# Patient Record
Sex: Female | Born: 1980
Health system: Southern US, Community
[De-identification: ages and names within clinical notes are randomized; demographics above are authoritative.]

## PROBLEM LIST (undated history)

## (undated) DIAGNOSIS — O24419 Gestational diabetes mellitus in pregnancy, unspecified control: Secondary | ICD-10-CM

## (undated) DIAGNOSIS — A630 Anogenital (venereal) warts: Secondary | ICD-10-CM

## (undated) DIAGNOSIS — A6000 Herpesviral infection of urogenital system, unspecified: Secondary | ICD-10-CM

## (undated) DIAGNOSIS — D563 Thalassemia minor: Secondary | ICD-10-CM

## (undated) DIAGNOSIS — Z8619 Personal history of other infectious and parasitic diseases: Secondary | ICD-10-CM

## (undated) DIAGNOSIS — D509 Iron deficiency anemia, unspecified: Secondary | ICD-10-CM

## (undated) DIAGNOSIS — T7840XA Allergy, unspecified, initial encounter: Secondary | ICD-10-CM

## (undated) DIAGNOSIS — O09299 Supervision of pregnancy with other poor reproductive or obstetric history, unspecified trimester: Secondary | ICD-10-CM

## (undated) DIAGNOSIS — N921 Excessive and frequent menstruation with irregular cycle: Secondary | ICD-10-CM

## (undated) DIAGNOSIS — I10 Essential (primary) hypertension: Secondary | ICD-10-CM

## (undated) DIAGNOSIS — R87629 Unspecified abnormal cytological findings in specimens from vagina: Secondary | ICD-10-CM

## (undated) HISTORY — DX: Gestational diabetes mellitus in pregnancy, unspecified control: O24.419

## (undated) HISTORY — DX: Anogenital (venereal) warts: A63.0

## (undated) HISTORY — DX: Thalassemia minor: D56.3

## (undated) HISTORY — DX: Personal history of other infectious and parasitic diseases: Z86.19

## (undated) HISTORY — DX: Herpesviral infection of urogenital system, unspecified: A60.00

## (undated) HISTORY — PX: COLPOSCOPY: SHX161

## (undated) HISTORY — DX: Allergy, unspecified, initial encounter: T78.40XA

## (undated) HISTORY — DX: Iron deficiency anemia, unspecified: D50.9

## (undated) HISTORY — DX: Supervision of pregnancy with other poor reproductive or obstetric history, unspecified trimester: O09.299

## (undated) HISTORY — DX: Essential (primary) hypertension: I10

## (undated) HISTORY — DX: Excessive and frequent menstruation with irregular cycle: N92.1

---
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
## 2007-03-27 HISTORY — PX: DILATION AND EVACUATION: SHX1459

## 2007-07-07 ENCOUNTER — Inpatient Hospital Stay (HOSPITAL_COMMUNITY): Admission: AD | Admit: 2007-07-07 | Discharge: 2007-07-11 | Payer: Self-pay | Admitting: Obstetrics & Gynecology

## 2007-07-09 ENCOUNTER — Encounter (INDEPENDENT_AMBULATORY_CARE_PROVIDER_SITE_OTHER): Payer: Self-pay | Admitting: Obstetrics & Gynecology

## 2007-07-14 ENCOUNTER — Inpatient Hospital Stay (HOSPITAL_COMMUNITY): Admission: AD | Admit: 2007-07-14 | Discharge: 2007-07-15 | Payer: Self-pay | Admitting: Obstetrics and Gynecology

## 2007-07-17 ENCOUNTER — Ambulatory Visit (HOSPITAL_COMMUNITY): Admission: AD | Admit: 2007-07-17 | Discharge: 2007-07-17 | Payer: Self-pay | Admitting: Obstetrics and Gynecology

## 2007-07-17 ENCOUNTER — Encounter (INDEPENDENT_AMBULATORY_CARE_PROVIDER_SITE_OTHER): Payer: Self-pay | Admitting: Obstetrics and Gynecology

## 2008-06-10 ENCOUNTER — Emergency Department (HOSPITAL_COMMUNITY): Admission: EM | Admit: 2008-06-10 | Discharge: 2008-06-10 | Payer: Self-pay | Admitting: Family Medicine

## 2010-05-23 ENCOUNTER — Other Ambulatory Visit: Payer: Self-pay | Admitting: Obstetrics & Gynecology

## 2010-07-06 LAB — HERPES SIMPLEX VIRUS CULTURE

## 2010-07-06 LAB — POCT URINALYSIS DIP (DEVICE)
Protein, ur: NEGATIVE mg/dL
Specific Gravity, Urine: 1.02 (ref 1.005–1.030)
Urobilinogen, UA: 0.2 mg/dL (ref 0.0–1.0)
pH: 6 (ref 5.0–8.0)

## 2010-07-06 LAB — WET PREP, GENITAL: Trich, Wet Prep: NONE SEEN

## 2010-07-06 LAB — URINE CULTURE

## 2010-07-06 LAB — POCT PREGNANCY, URINE: Preg Test, Ur: NEGATIVE

## 2010-07-14 ENCOUNTER — Other Ambulatory Visit: Payer: Self-pay | Admitting: Oncology

## 2010-07-14 ENCOUNTER — Encounter (HOSPITAL_BASED_OUTPATIENT_CLINIC_OR_DEPARTMENT_OTHER): Payer: 59 | Admitting: Oncology

## 2010-07-14 DIAGNOSIS — D500 Iron deficiency anemia secondary to blood loss (chronic): Secondary | ICD-10-CM

## 2010-07-14 LAB — MORPHOLOGY: PLT EST: INCREASED

## 2010-07-14 LAB — CBC WITH DIFFERENTIAL/PLATELET
BASO%: 0.9 % (ref 0.0–2.0)
Basophils Absolute: 0.1 10*3/uL (ref 0.0–0.1)
Eosinophils Absolute: 0.1 10*3/uL (ref 0.0–0.5)
HCT: 20.4 % — ABNORMAL LOW (ref 34.8–46.6)
HGB: 5.7 g/dL — CL (ref 11.6–15.9)
LYMPH%: 30.7 % (ref 14.0–49.7)
MONO#: 0.6 10*3/uL (ref 0.1–0.9)
NEUT#: 3.9 10*3/uL (ref 1.5–6.5)
NEUT%: 58.5 % (ref 38.4–76.8)
Platelets: 689 10*3/uL — ABNORMAL HIGH (ref 145–400)
WBC: 6.7 10*3/uL (ref 3.9–10.3)
lymph#: 2 10*3/uL (ref 0.9–3.3)

## 2010-07-14 LAB — FERRITIN: Ferritin: 17 ng/mL (ref 10–291)

## 2010-07-17 ENCOUNTER — Encounter (HOSPITAL_BASED_OUTPATIENT_CLINIC_OR_DEPARTMENT_OTHER): Payer: 59 | Admitting: Oncology

## 2010-07-17 DIAGNOSIS — D649 Anemia, unspecified: Secondary | ICD-10-CM

## 2010-07-17 DIAGNOSIS — D473 Essential (hemorrhagic) thrombocythemia: Secondary | ICD-10-CM

## 2010-07-17 DIAGNOSIS — N920 Excessive and frequent menstruation with regular cycle: Secondary | ICD-10-CM

## 2010-07-18 ENCOUNTER — Encounter (HOSPITAL_BASED_OUTPATIENT_CLINIC_OR_DEPARTMENT_OTHER): Payer: 59 | Admitting: Oncology

## 2010-07-18 DIAGNOSIS — N920 Excessive and frequent menstruation with regular cycle: Secondary | ICD-10-CM

## 2010-07-18 DIAGNOSIS — D649 Anemia, unspecified: Secondary | ICD-10-CM

## 2010-08-08 NOTE — Op Note (Signed)
NAME:  Emily Downs, Emily Downs NO.:  [id number]   MEDICAL RECORD NO.:  [id number]          PATIENT TYPE:  MAT   LOCATION:  [facility]                          FACILITY:  [facility]   PHYSICIAN:  Randye Lobo, M.D.   DATE OF BIRTH:  1981/03/10   DATE OF PROCEDURE:  07/17/2007  DATE OF DISCHARGE:  07/17/2007                               OPERATIVE REPORT   PREOPERATIVE DIAGNOSES:  1. Retained products of conception.  2. Status post spontaneous vaginal delivery postpartum day #8.   POSTOPERATIVE DIAGNOSES:  1. Retained products of conception.  2. Status post spontaneous vaginal delivery, postpartum day #8.   PROCEDURE:  Dilation and evacuation.   SURGEON:  Conley Simmonds, MD   ANESTHESIA:  Saddle block by Dr. Harvest Forest.   ESTIMATED BLOOD LOSS:  100 mL.   URINE OUTPUT:  25 mL by I and O catheterization prior to procedure.   COMPLICATIONS:  None.   INDICATIONS FOR PROCEDURE:  The patient is a 30 year old gravida 1, para  1 African-American female, postpartum day #8, who presented with a  report of vaginal bleeding and clotting of 8 hours duration combined  with mild cramping.  The patient's prenatal course was complicated by  pregnancy-induced hypertension and treatment of this with Labetalol and  magnesium sulfate IV.  The patient denied any fever, shakes and chills.  Upon presentation, the patient's blood pressure was noted to be 137/90.  Her abdominal exam was consistent with a uterus that was approximately  [redacted] weeks gestational equivalent size.  Pelvic exam demonstrated large  amounts of clots in the vagina.  However, there was no active bleeding  at the time of the exam.  A CBC demonstrated a white blood cell count of  11.9 and a hemoglobin of 12.2.  An ultrasound documented a 4 x 6 cm  collection in the endometrium consistent with retained products of  conception.  There was increased blood flow to this area.   A plan is now made to proceed with a dilation and evacuation  procedure  for suspected retained products of conception after risks, benefits, and  alternatives are reviewed.   FINDINGS:  Exam under anesthesia revealed an 18 weeks size uterus.  The  cervix was noted to be dilated.  A moderate amount of clot and some tissue was obtained from the uterine  cavity.   SPECIMEN:  Products of conception were sent to pathology.   PROCEDURE:  The patient was escorted down to the operating room.  She  did receive Ancef 1 gram IV for antibiotic prophylaxis.  The patient  received a spinal block and was then placed in the dorsal lithotomy  position.  The abdomen and vagina were sterilely prepped and draped and  the bladder was catheterized of urine.   An exam under anesthesia was performed.  A speculum was placed inside  the vagina and a single-tooth tenaculum was placed on the anterior  cervical lip.  The cervix was noted to be dilated.  The uterus was  sounded to approximately 12 cm.  A #12 suction tip curette was then  introduced through the  cervical os to the level of the uterine fundus  and withdrawn slightly.  Proper suction was applied and the suction tip  curette was turned in a clockwise fashion while it was removed from the  uterine cavity.  This was repeated an additional times until there was  no remaining products of conception noted.  A gentle sharp curettage was  performed in all four quadrants and again there was a very minimal  amount of tissue obtained.  There was only a small amount of clot noted.   The suction tip curette was introduced an additional two times and  proper suction was applied during each of these applications to remove  any remaining blood and clots from within the uterine cavity.  None were  further appreciated.   This concluded the patient's procedure.  All of the instruments were  removed.  The patient did receive Pitocin 20 units IV.  She had  excellent hemostasis.  The patient was cleansed with Betadine, taken  out  of the dorsal lithotomy position, and escorted to the recovery room in  stable and awake condition.  There were no complications to the  procedure.  All instrument, needle and sponge counts were correct.      Randye Lobo, M.D.  Electronically Signed     BES/MEDQ  D:  07/18/2007  T:  07/18/2007  Job:  [id number]

## 2010-08-11 NOTE — Discharge Summary (Signed)
NAME:  SHANEE, BATCH NO.:  [id number]   MEDICAL RECORD NO.:  [id number]          PATIENT TYPE:  INP   LOCATION:  9373                          FACILITY:  [facility]   PHYSICIAN:  Ilda Mori, M.D.   DATE OF BIRTH:  [birth year]   DATE OF ADMISSION:  07/14/2007  DATE OF DISCHARGE:  07/15/2007                               DISCHARGE SUMMARY   FINAL DIAGNOSIS:  Postpartum hypertension with questionable  preeclampsia.   COMPLICATIONS:  None.   This 30 year old G1, P1 presents after she had been discharged from a  spontaneous vaginal delivery on April 17.  She was seen by home health  care nurse today and noted elevated blood pressures about 170/110.  She  did not have any headache, visual changes, or epigastric pain.  The  patient was noted to have some elevated blood pressures postpartum, but  was sent home with labetalol.  PIH labs were normal prior to discharge.  Upon admission, the patient was given 10 mg of Procardia and blood  pressures did decrease.  The labetalol was stopped.  She was continued  to be monitored in the hospital.  She continued to feel fine.  The  patient did have some elevation of her liver function tests and was put  on magnesium for prophylaxis.  Normal platelets.  No protein in her  urine and by hospital day #2, the patient was felt ready for discharge  later in the day.  Her liver function tests were returning to normal.  Her blood pressure was stabilizing.  She was sent home on a regular  diet, told to decrease activities, told to continue her Procardia XL 60  mg daily, to follow up in our office in 1 week, and of course to call  with any symptoms or elevated blood pressures.   DISCHARGE LABORATORY DATA:  The patient had a hemoglobin of 12.8,  platelets of 338,000.  Like I said, she did have some elevated liver  function tests which were returning to normal on the afternoon of  discharge.  She had an AST of 43, ALT of 74 on  discharge.      Leilani Able, P.A.-C.      Ilda Mori, M.D.  Electronically Signed    MB/MEDQ  D:  08/04/2007  T:  08/05/2007  Job:  [id number]

## 2010-08-16 ENCOUNTER — Encounter (HOSPITAL_BASED_OUTPATIENT_CLINIC_OR_DEPARTMENT_OTHER): Payer: 59 | Admitting: Oncology

## 2010-08-16 ENCOUNTER — Other Ambulatory Visit: Payer: Self-pay | Admitting: Oncology

## 2010-08-16 DIAGNOSIS — D500 Iron deficiency anemia secondary to blood loss (chronic): Secondary | ICD-10-CM

## 2010-08-16 LAB — CBC WITH DIFFERENTIAL/PLATELET
Basophils Absolute: 0 10*3/uL (ref 0.0–0.1)
Eosinophils Absolute: 0.1 10*3/uL (ref 0.0–0.5)
HCT: 33.1 % — ABNORMAL LOW (ref 34.8–46.6)
HGB: 10.2 g/dL — ABNORMAL LOW (ref 11.6–15.9)
MCH: 21.7 pg — ABNORMAL LOW (ref 25.1–34.0)
MCV: 70.4 fL — ABNORMAL LOW (ref 79.5–101.0)
NEUT#: 2.4 10*3/uL (ref 1.5–6.5)
NEUT%: 57.9 % (ref 38.4–76.8)
RDW: 26 % — ABNORMAL HIGH (ref 11.2–14.5)
lymph#: 1.5 10*3/uL (ref 0.9–3.3)

## 2010-08-17 LAB — FERRITIN: Ferritin: 213 ng/mL (ref 10–291)

## 2010-09-20 ENCOUNTER — Other Ambulatory Visit: Payer: Self-pay | Admitting: Oncology

## 2010-09-20 ENCOUNTER — Encounter (HOSPITAL_BASED_OUTPATIENT_CLINIC_OR_DEPARTMENT_OTHER): Payer: 59 | Admitting: Oncology

## 2010-09-20 DIAGNOSIS — D500 Iron deficiency anemia secondary to blood loss (chronic): Secondary | ICD-10-CM

## 2010-09-20 LAB — CBC WITH DIFFERENTIAL/PLATELET
Basophils Absolute: 0 10*3/uL (ref 0.0–0.1)
Eosinophils Absolute: 0.1 10*3/uL (ref 0.0–0.5)
HCT: 34.5 % — ABNORMAL LOW (ref 34.8–46.6)
HGB: 11 g/dL — ABNORMAL LOW (ref 11.6–15.9)
LYMPH%: 42.4 % (ref 14.0–49.7)
MONO#: 0.3 10*3/uL (ref 0.1–0.9)
NEUT%: 50.1 % (ref 38.4–76.8)
Platelets: 296 10*3/uL (ref 145–400)
WBC: 4.9 10*3/uL (ref 3.9–10.3)
lymph#: 2.1 10*3/uL (ref 0.9–3.3)

## 2010-11-22 ENCOUNTER — Other Ambulatory Visit: Payer: Self-pay | Admitting: Oncology

## 2010-11-22 ENCOUNTER — Encounter (HOSPITAL_BASED_OUTPATIENT_CLINIC_OR_DEPARTMENT_OTHER): Payer: 59 | Admitting: Oncology

## 2010-11-22 DIAGNOSIS — D509 Iron deficiency anemia, unspecified: Secondary | ICD-10-CM

## 2010-11-22 DIAGNOSIS — D500 Iron deficiency anemia secondary to blood loss (chronic): Secondary | ICD-10-CM

## 2010-11-22 DIAGNOSIS — D649 Anemia, unspecified: Secondary | ICD-10-CM

## 2010-11-22 DIAGNOSIS — N920 Excessive and frequent menstruation with regular cycle: Secondary | ICD-10-CM

## 2010-11-22 LAB — FERRITIN: Ferritin: 33 ng/mL (ref 10–291)

## 2010-11-22 LAB — CBC WITH DIFFERENTIAL/PLATELET
Basophils Absolute: 0 10*3/uL (ref 0.0–0.1)
Eosinophils Absolute: 0.1 10*3/uL (ref 0.0–0.5)
HCT: 34.4 % — ABNORMAL LOW (ref 34.8–46.6)
HGB: 11.3 g/dL — ABNORMAL LOW (ref 11.6–15.9)
LYMPH%: 37 % (ref 14.0–49.7)
MONO#: 0.3 10*3/uL (ref 0.1–0.9)
NEUT#: 2.6 10*3/uL (ref 1.5–6.5)
NEUT%: 55.6 % (ref 38.4–76.8)
Platelets: 320 10*3/uL (ref 145–400)
RBC: 4.77 10*6/uL (ref 3.70–5.45)
WBC: 4.7 10*3/uL (ref 3.9–10.3)
nRBC: 0 % (ref 0–0)

## 2010-11-22 LAB — COMPREHENSIVE METABOLIC PANEL
ALT: 18 U/L (ref 0–35)
CO2: 27 mEq/L (ref 19–32)
Calcium: 8.7 mg/dL (ref 8.4–10.5)
Chloride: 102 mEq/L (ref 96–112)
Creatinine, Ser: 0.72 mg/dL (ref 0.50–1.10)
Glucose, Bld: 69 mg/dL — ABNORMAL LOW (ref 70–99)

## 2010-11-22 LAB — IRON AND TIBC: TIBC: 333 ug/dL (ref 250–470)

## 2010-12-19 LAB — URINALYSIS, ROUTINE W REFLEX MICROSCOPIC
Bilirubin Urine: NEGATIVE
Hgb urine dipstick: NEGATIVE
Leukocytes, UA: NEGATIVE
Nitrite: NEGATIVE
Protein, ur: NEGATIVE
Specific Gravity, Urine: <1.005 — ABNORMAL LOW
Urobilinogen, UA: 0.2
pH: 7

## 2010-12-19 LAB — COMPREHENSIVE METABOLIC PANEL
AST: 43 — ABNORMAL HIGH
Albumin: 2.9 — ABNORMAL LOW
Albumin: 3 — ABNORMAL LOW
Alkaline Phosphatase: 150 — ABNORMAL HIGH
BUN: 10
BUN: 13
BUN: 14
CO2: 25
Calcium: 8.9
Chloride: 103
Chloride: 103
Chloride: 108
Creatinine, Ser: 0.85
Creatinine, Ser: 0.95
GFR calc Af Amer: >60
GFR calc non Af Amer: >60
Glucose, Bld: 87
Glucose, Bld: 78
Glucose, Bld: 85
Potassium: 3.9
Sodium: 135
Total Bilirubin: 0.2 — ABNORMAL LOW
Total Bilirubin: 0.5
Total Bilirubin: 0.6
Total Protein: 6.2
Total Protein: 6.8
Total Protein: 6.9

## 2010-12-19 LAB — CBC
HCT: 34.5 — ABNORMAL LOW
HCT: 28.6 — ABNORMAL LOW
HCT: 39.3
HCT: 39.5
Hemoglobin: 11.3 — ABNORMAL LOW
Hemoglobin: 10.7 — ABNORMAL LOW
Hemoglobin: 12.9
Hemoglobin: 9.5 — ABNORMAL LOW
MCHC: 32.6
MCHC: 32.8
MCHC: 32.9
MCV: 74.4 — ABNORMAL LOW
MCV: 75.3 — ABNORMAL LOW
Platelets: 338
RBC: 4.36
RBC: 5.28 — ABNORMAL HIGH
RDW: 17.1 — ABNORMAL HIGH
RDW: 16.1 — ABNORMAL HIGH
RDW: 16.6 — ABNORMAL HIGH
RDW: 17.3 — ABNORMAL HIGH

## 2010-12-19 LAB — LACTATE DEHYDROGENASE
LDH: 174
LDH: 288 — ABNORMAL HIGH

## 2010-12-19 LAB — URIC ACID
Uric Acid, Serum: 5.1
Uric Acid, Serum: 7.6 — ABNORMAL HIGH

## 2010-12-19 LAB — URINE MICROSCOPIC-ADD ON

## 2010-12-19 LAB — CCBB MATERNAL DONOR DRAW

## 2011-03-01 ENCOUNTER — Encounter: Payer: Self-pay | Admitting: *Deleted

## 2011-03-04 ENCOUNTER — Encounter: Payer: Self-pay | Admitting: Oncology

## 2011-03-04 DIAGNOSIS — N921 Excessive and frequent menstruation with irregular cycle: Secondary | ICD-10-CM | POA: Insufficient documentation

## 2011-03-04 DIAGNOSIS — D509 Iron deficiency anemia, unspecified: Secondary | ICD-10-CM | POA: Insufficient documentation

## 2011-03-12 ENCOUNTER — Other Ambulatory Visit: Payer: Self-pay | Admitting: Oncology

## 2011-03-12 ENCOUNTER — Ambulatory Visit (HOSPITAL_BASED_OUTPATIENT_CLINIC_OR_DEPARTMENT_OTHER): Payer: 59 | Admitting: Oncology

## 2011-03-12 ENCOUNTER — Telehealth: Payer: Self-pay | Admitting: Oncology

## 2011-03-12 ENCOUNTER — Other Ambulatory Visit (HOSPITAL_BASED_OUTPATIENT_CLINIC_OR_DEPARTMENT_OTHER): Payer: 59 | Admitting: Lab

## 2011-03-12 DIAGNOSIS — N921 Excessive and frequent menstruation with irregular cycle: Secondary | ICD-10-CM

## 2011-03-12 DIAGNOSIS — N920 Excessive and frequent menstruation with regular cycle: Secondary | ICD-10-CM

## 2011-03-12 DIAGNOSIS — D473 Essential (hemorrhagic) thrombocythemia: Secondary | ICD-10-CM

## 2011-03-12 DIAGNOSIS — J069 Acute upper respiratory infection, unspecified: Secondary | ICD-10-CM

## 2011-03-12 DIAGNOSIS — D500 Iron deficiency anemia secondary to blood loss (chronic): Secondary | ICD-10-CM

## 2011-03-12 DIAGNOSIS — D509 Iron deficiency anemia, unspecified: Secondary | ICD-10-CM

## 2011-03-12 LAB — CBC WITH DIFFERENTIAL/PLATELET
EOS%: 2.2 % (ref 0.0–7.0)
LYMPH%: 32.3 % (ref 14.0–49.7)
MCH: 23.6 pg — ABNORMAL LOW (ref 25.1–34.0)
MCHC: 32.4 g/dL (ref 31.5–36.0)
MCV: 73 fL — ABNORMAL LOW (ref 79.5–101.0)
MONO%: 5.8 % (ref 0.0–14.0)
NEUT#: 3.6 10*3/uL (ref 1.5–6.5)
Platelets: 289 10*3/uL (ref 145–400)
RBC: 4.66 10*6/uL (ref 3.70–5.45)
RDW: 15.3 % — ABNORMAL HIGH (ref 11.2–14.5)
nRBC: 0 % (ref 0–0)

## 2011-03-12 LAB — COMPREHENSIVE METABOLIC PANEL
ALT: 18 U/L (ref 0–35)
Alkaline Phosphatase: 54 U/L (ref 39–117)
CO2: 31 mEq/L (ref 19–32)
Creatinine, Ser: 0.77 mg/dL (ref 0.50–1.10)
Sodium: 141 mEq/L (ref 135–145)
Total Bilirubin: 0.2 mg/dL — ABNORMAL LOW (ref 0.3–1.2)
Total Protein: 6.3 g/dL (ref 6.0–8.3)

## 2011-03-12 LAB — IRON AND TIBC
%SAT: 13 % — ABNORMAL LOW (ref 20–55)
Iron: 41 ug/dL — ABNORMAL LOW (ref 42–145)
UIBC: 282 ug/dL (ref 125–400)

## 2011-03-12 LAB — FERRITIN: Ferritin: 23 ng/mL (ref 10–291)

## 2011-03-12 NOTE — Telephone Encounter (Signed)
gv pt appt schedule for April/aug/dec 2013.

## 2011-03-12 NOTE — Progress Notes (Signed)
[facility] OFFICE PROGRESS NOTE  [name], MD, MD  DIAGNOSIS:  Microcytic anemia, presumed iron deficiency secondary to menometrorrhagia.  CURRENT THERAPY:  IV iron as needed; oral iron daily.   INTERVAL HISTORY: Emily Downs 30 y.o. female returns for regular follow up.  She is working full-time as a has not additions to her second child during this holiday season. She is a 74-year-old daughter and. She feels fatigued when she comes home at night. She denies any visible source of bleeding beside her menometrorrhagia.  She was on oral contraceptives which caused her vaginal spotting for 4 weeks straight.  She therefore has discontinued the oral contraceptive at this time.  For the past few days, she has been having nasal congestion and rhinorrhea without fever, productive cough, SOB.   Patient denies headache, visual changes, confusion, drenching night sweats, palpable lymph node swelling, mucositis, odynophagia, dysphagia, nausea vomiting, jaundice, chest pain, palpitation, shortness of breath, dyspnea on exertion, gum bleeding, epistaxis, hematemesis, hemoptysis, abdominal pain, abdominal swelling, early satiety, melena, hematochezia, hematuria, skin rash, spontaneous bleeding, joint swelling, joint pain, heat or cold intolerance, bowel bladder incontinence, back pain, focal motor weakness, paresthesia, depression, suicidal or homocidal ideation, feeling hopelessness.   MEDICAL HISTORY: Past Medical History  Diagnosis Date  . Menometrorrhagia   . Iron deficiency anemia     SURGICAL HISTORY: No past surgical history on file.  MEDICATIONS: Current Outpatient Prescriptions  Medication Sig Dispense Refill  . cetirizine (ZYRTEC) 10 MG tablet Take 10 mg by mouth daily.        . Iron 66 MG TABS Take 1 tablet by mouth daily.        . Prenatal Vit-Fe Fumarate-FA (PRENATAL MULTIVITAMIN) TABS Take 1 tablet by mouth daily.        . valACYclovir (VALTREX) 1000 MG tablet  Take 1,000 mg by mouth daily.          ALLERGIES:   has no known allergies.  REVIEW OF SYSTEMS:  The rest of the 14-point review of system was negative.   Filed Vitals:   03/12/11 1001  BP: 129/89  Pulse: 78  Temp: 98.2 F (36.8 C)   Wt Readings from Last 3 Encounters:  03/12/11 143 lb 11.2 oz (65.182 kg)  11/22/10 129 lb 6.4 oz (58.695 kg)   ECOG Performance status: 0  PHYSICAL EXAMINATION:   General:  well-nourished in no acute distress.  Eyes:  no scleral icterus.  ENT:  There were no oropharyngeal lesions.  Neck was without thyromegaly.  Lymphatics:  Negative cervical, supraclavicular or axillary adenopathy.  Respiratory: lungs were clear bilaterally without wheezing or crackles.  Cardiovascular:  Regular rate and rhythm, S1/S2, without murmur, rub or gallop.  There was no pedal edema.  GI:  abdomen was soft, flat, nontender, nondistended, without organomegaly.  Muscoloskeletal:  no spinal tenderness of palpation of vertebral spine.  Skin exam was without echymosis, petichae.  Neuro exam was nonfocal.  Patient was able to get on and off exam table without assistance.  Gait was normal.  Patient was alerted and oriented.  Attention was good.   Language was appropriate.  Mood was normal without depression.  Speech was not pressured.  Thought content was not tangential.     LABORATORY/RADIOLOGY DATA:  Lab Results  Component Value Date   WBC 6.0 03/12/2011   HGB 11.0* 03/12/2011   HCT 34.0* 03/12/2011   PLT 289 03/12/2011   GLUCOSE 69* 11/22/2010   GLUCOSE 69* 11/22/2010   ALT  18 11/22/2010   ALT 18 11/22/2010   AST 31 11/22/2010   AST 31 11/22/2010   NA 138 11/22/2010   NA 138 11/22/2010   K 3.7 11/22/2010   K 3.7 11/22/2010   CL 102 11/22/2010   CL 102 11/22/2010   CREATININE 0.72 11/22/2010   CREATININE 0.72 11/22/2010   BUN 10 11/22/2010   BUN 10 11/22/2010   CO2 27 11/22/2010   CO2 27 11/22/2010     ASSESSMENT AND PLAN:    1. Menometrorrhagia.  She sees Dr. Arlyce Dice. 2. Iron  deficiency anemia.  She did have Feraheme back on April 24th 2012 with one dose of 1020 mg.  Her Hgb has been quite stable with increasing MCV and decreasing RDW.  Iron level is pending today.  She is taking oral iron with orange juice which increases her weight.  I advised her to take oral iron with VitC to increase absorption.  3. Thrombocytosis.  This was secondary to her iron deficiency anemia.  This has resolved.  4. Positive ANA with a very low titer at a ratio of 1:40.  This is most likely reactive and there has been no need for any further workup on our end.  However, we will defer her PCP. 5. URI:  I advised her on oral hydration, OTC measures such as Nyquil or Mucinex.   6. Followup.  Lab only in 4 and 8 months.  Lab/RV with me in 1 year.  Will continue to keep track of her ferritin.  If she has severe iron deficiency, we'll try IV iron again.

## 2011-07-12 ENCOUNTER — Other Ambulatory Visit: Payer: 59 | Admitting: Lab

## 2011-11-07 ENCOUNTER — Other Ambulatory Visit (HOSPITAL_BASED_OUTPATIENT_CLINIC_OR_DEPARTMENT_OTHER): Payer: 59 | Admitting: Lab

## 2011-11-07 DIAGNOSIS — D509 Iron deficiency anemia, unspecified: Secondary | ICD-10-CM

## 2011-11-07 DIAGNOSIS — N921 Excessive and frequent menstruation with irregular cycle: Secondary | ICD-10-CM

## 2011-11-07 LAB — CBC WITH DIFFERENTIAL/PLATELET
BASO%: 0.3 % (ref 0.0–2.0)
EOS%: 1.7 % (ref 0.0–7.0)
HCT: 36.4 % (ref 34.8–46.6)
HGB: 11.5 g/dL — ABNORMAL LOW (ref 11.6–15.9)
MCH: 23.3 pg — ABNORMAL LOW (ref 25.1–34.0)
MCHC: 31.6 g/dL (ref 31.5–36.0)
MONO#: 0.4 10*3/uL (ref 0.1–0.9)
RDW: 16.3 % — ABNORMAL HIGH (ref 11.2–14.5)
WBC: 5.4 10*3/uL (ref 3.9–10.3)
lymph#: 1.8 10*3/uL (ref 0.9–3.3)

## 2011-11-07 LAB — COMPREHENSIVE METABOLIC PANEL
ALT: 16 U/L (ref 0–35)
AST: 22 U/L (ref 0–37)
Albumin: 3.2 g/dL — ABNORMAL LOW (ref 3.5–5.2)
CO2: 27 mEq/L (ref 19–32)
Calcium: 8.5 mg/dL (ref 8.4–10.5)
Chloride: 104 mEq/L (ref 96–112)
Potassium: 4.1 mEq/L (ref 3.5–5.3)
Total Protein: 6.5 g/dL (ref 6.0–8.3)

## 2011-11-07 LAB — FERRITIN: Ferritin: 11 ng/mL (ref 10–291)

## 2011-11-09 ENCOUNTER — Telehealth: Payer: Self-pay

## 2011-11-09 NOTE — Telephone Encounter (Signed)
Message copied by Kallie Locks on Fri Nov 09, 2011  4:12 PM ------      Message from: HA, Raliegh Ip T      Created: Thu Nov 08, 2011 10:29 AM       Please call pt.  Her iron deficiency anemia has improved.  Advise her to continue oral iron as tolerated.  Thanks.

## 2011-12-31 ENCOUNTER — Ambulatory Visit (INDEPENDENT_AMBULATORY_CARE_PROVIDER_SITE_OTHER): Payer: 59 | Admitting: Physician Assistant

## 2011-12-31 VITALS — BP 132/90 | HR 64 | Temp 98.1°F | Resp 16 | Ht 60.25 in | Wt 149.6 lb

## 2011-12-31 DIAGNOSIS — R61 Generalized hyperhidrosis: Secondary | ICD-10-CM

## 2011-12-31 DIAGNOSIS — R5383 Other fatigue: Secondary | ICD-10-CM

## 2011-12-31 DIAGNOSIS — R5381 Other malaise: Secondary | ICD-10-CM

## 2011-12-31 MED ORDER — AMOXICILLIN 500 MG PO CAPS: ORAL_CAPSULE | ORAL | Status: DC

## 2011-12-31 MED ORDER — FLUTICASONE PROPIONATE 50 MCG/ACT NA SUSP: 2.0000 | Freq: Every day | NASAL | Status: DC

## 2011-12-31 NOTE — Progress Notes (Signed)
  Subjective:    Patient ID: Emily Downs, female    DOB: [birth year], 31 y.o.   MRN: [id number]  HPI 31 yo AAF presents with sinus congestion X 3 weeks.  She has not had f/c.  Some drainage at night is causing her to cough.  In the mornings she is having discolored yellow mucus when she blows her nose.  She also c/o night sweats for "years." she has been on BCP for 4-5 months and the night sweats seem to be worsening. She denies any ongoing cough, weight loss or other constitutional s/sx.  Review of Systems  All other systems reviewed and are negative.       Objective:   Physical Exam  Nursing note and vitals reviewed. Constitutional: She appears well-developed and well-nourished.  HENT:  Head: Normocephalic and atraumatic.  Right Ear: External ear normal.  Left Ear: External ear normal.  Mouth/Throat: Oropharynx is clear and moist. No oropharyngeal exudate.       Turbinates pale, boggy, and enlarged.  TTP maxillary sinuses B.  Cardiovascular: Normal rate, regular rhythm and normal heart sounds.   Pulmonary/Chest: Effort normal and breath sounds normal.       Assessment & Plan:  Sinusitis-see Rxs Night sweats-per her request we will check hormone levels.  I suspect they will be normal since she is on the pill.  We will also check TSH and vitamin D.

## 2012-01-01 ENCOUNTER — Telehealth: Payer: Self-pay | Admitting: Family Medicine

## 2012-01-01 NOTE — Telephone Encounter (Signed)
Spoke with patient about all nl lab results.

## 2012-01-04 ENCOUNTER — Encounter: Payer: Self-pay | Admitting: Physician Assistant

## 2012-02-19 ENCOUNTER — Other Ambulatory Visit: Payer: Self-pay | Admitting: Physician Assistant

## 2012-02-19 DIAGNOSIS — N921 Excessive and frequent menstruation with irregular cycle: Secondary | ICD-10-CM

## 2012-02-19 DIAGNOSIS — D509 Iron deficiency anemia, unspecified: Secondary | ICD-10-CM

## 2012-02-19 MED ORDER — PRENATAL MULTIVITAMIN CH: 1.0000 | ORAL_TABLET | Freq: Every day | ORAL | Status: DC

## 2012-02-22 ENCOUNTER — Telehealth: Payer: Self-pay | Admitting: Oncology

## 2012-02-22 NOTE — Telephone Encounter (Signed)
Moved 12/18 appt to 12/16 due to call day. S/w pt she is aware.

## 2012-03-08 ENCOUNTER — Other Ambulatory Visit: Payer: Self-pay | Admitting: Oncology

## 2012-03-08 DIAGNOSIS — D509 Iron deficiency anemia, unspecified: Secondary | ICD-10-CM

## 2012-03-08 DIAGNOSIS — N921 Excessive and frequent menstruation with irregular cycle: Secondary | ICD-10-CM

## 2012-03-10 ENCOUNTER — Ambulatory Visit (HOSPITAL_BASED_OUTPATIENT_CLINIC_OR_DEPARTMENT_OTHER): Payer: 59 | Admitting: Oncology

## 2012-03-10 ENCOUNTER — Other Ambulatory Visit (HOSPITAL_BASED_OUTPATIENT_CLINIC_OR_DEPARTMENT_OTHER): Payer: 59 | Admitting: Lab

## 2012-03-10 ENCOUNTER — Telehealth: Payer: Self-pay | Admitting: Oncology

## 2012-03-10 VITALS — BP 146/91 | HR 76 | Temp 98.2°F | Resp 18 | Ht 60.25 in | Wt 150.3 lb

## 2012-03-10 DIAGNOSIS — N921 Excessive and frequent menstruation with irregular cycle: Secondary | ICD-10-CM

## 2012-03-10 DIAGNOSIS — R799 Abnormal finding of blood chemistry, unspecified: Secondary | ICD-10-CM

## 2012-03-10 DIAGNOSIS — D509 Iron deficiency anemia, unspecified: Secondary | ICD-10-CM

## 2012-03-10 DIAGNOSIS — N920 Excessive and frequent menstruation with regular cycle: Secondary | ICD-10-CM

## 2012-03-10 LAB — CBC WITH DIFFERENTIAL/PLATELET
Basophils Absolute: 0 10*3/uL (ref 0.0–0.1)
Eosinophils Absolute: 0.1 10*3/uL (ref 0.0–0.5)
HCT: 36.9 % (ref 34.8–46.6)
HGB: 11.9 g/dL (ref 11.6–15.9)
LYMPH%: 29 % (ref 14.0–49.7)
MONO#: 0.4 10*3/uL (ref 0.1–0.9)
NEUT#: 3.4 10*3/uL (ref 1.5–6.5)
NEUT%: 62 % (ref 38.4–76.8)
Platelets: 316 10*3/uL (ref 145–400)
RBC: 5.02 10*6/uL (ref 3.70–5.45)
WBC: 5.5 10*3/uL (ref 3.9–10.3)

## 2012-03-10 LAB — IRON AND TIBC
Iron: 117 ug/dL (ref 42–145)
TIBC: 426 ug/dL (ref 250–470)
UIBC: 309 ug/dL (ref 125–400)

## 2012-03-10 LAB — COMPREHENSIVE METABOLIC PANEL (CC13)
Albumin: 2.6 g/dL — ABNORMAL LOW (ref 3.5–5.0)
Alkaline Phosphatase: 49 U/L (ref 40–150)
BUN: 12 mg/dL (ref 7.0–26.0)
Glucose: 78 mg/dl (ref 70–99)
Total Bilirubin: 0.28 mg/dL (ref 0.20–1.20)

## 2012-03-10 NOTE — Progress Notes (Signed)
[facility] OFFICE PROGRESS NOTE  Emily Downs., MD  DIAGNOSIS:  Microcytic anemia, presumed iron deficiency secondary to menometrorrhagia.  CURRENT THERAPY:  IV iron as needed; oral iron daily.   INTERVAL HISTORY: Emily Downs 31 y.o. female returns for regular follow up.  She reports slight improvement of her fatigue with improving Hgb. 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  She has been on oral birth control and has significant improvement of her menometrorrhagia.  Each month, her cycle lasts for about 4 days; and she changes pad/tampon every 4 hours as opposed to every hour. She denied any other source of bleeding.  She has intermittent facial aciniform rash; she cannot tell if there is a component of sun-exposure causing the rash.  She denied rash elsewhere.  She denied focal bone pain.  The rest of the 14-point review of system was negative.    MEDICAL HISTORY: Past Medical History  Diagnosis Date  . Menometrorrhagia   . Iron deficiency anemia     SURGICAL HISTORY: No past surgical history on file.  MEDICATIONS: Current Outpatient Prescriptions  Medication Sig Dispense Refill  . acyclovir (ZOVIRAX) 400 MG tablet Take 400 mg by mouth Daily.      Marland Kitchen amoxicillin (AMOXIL) 500 MG capsule 2 tabs bid  40 capsule  0  . cetirizine (ZYRTEC) 10 MG tablet Take 10 mg by mouth daily.        . fluticasone (FLONASE) 50 MCG/ACT nasal spray Place 2 sprays into the nose daily.  16 g  6  . Iron 66 MG TABS Take 1 tablet by mouth daily.        Emily Downs Triphasic (ORTHO TRI-CYCLEN, 28, PO) Take by mouth.      . Prenatal Vit-Fe Fumarate-FA (PRENATAL MULTIVITAMIN) TABS Take 1 tablet by mouth daily.  90 tablet  3    ALLERGIES:   has no known allergies.  REVIEW OF SYSTEMS:  The rest of the 14-point review of system was negative.   Filed Vitals:   03/10/12 1332  BP: 146/91  Pulse: 76  Temp: 98.2 F (36.8 C)    Resp: 18   Wt Readings from Last 3 Encounters:  03/10/12 150 lb 4.8 oz (68.176 kg)  12/31/11 149 lb 9.6 oz (67.858 kg)  03/12/11 143 lb 11.2 oz (65.182 kg)   ECOG Performance status: 0  PHYSICAL EXAMINATION:   General:  well-nourished in no acute distress.  Eyes:  no scleral icterus.  ENT:  There were no oropharyngeal lesions.  Neck was without thyromegaly.  Lymphatics:  Negative cervical, supraclavicular or axillary adenopathy.  Respiratory: lungs were clear bilaterally without wheezing or crackles.  Cardiovascular:  Regular rate and rhythm, S1/S2, without murmur, rub or gallop.  There was no pedal edema.  GI:  abdomen was soft, flat, nontender, nondistended, without organomegaly.  Muscoloskeletal:  no spinal tenderness of palpation of vertebral spine.  Skin exam was without echymosis, petichae.  Neuro exam was nonfocal.  Patient was able to get on and off exam table without assistance.  Gait was normal.  Patient was alerted and oriented.  Attention was good.   Language was appropriate.  Mood was normal without depression.  Speech was not pressured.  Thought content was not tangential.     LABORATORY/RADIOLOGY DATA:  Lab Results  Component Value Date   WBC 5.5 03/10/2012   HGB 11.9 03/10/2012   HCT 36.9 03/10/2012   PLT 316  03/10/2012   GLUCOSE 78 03/10/2012   ALT 16 03/10/2012   AST 21 03/10/2012   NA 138 03/10/2012   K 4.3 03/10/2012   CL 105 03/10/2012   CREATININE 0.9 03/10/2012   BUN 12.0 03/10/2012   CO2 28 03/10/2012   TSH 1.898 12/31/2011     ASSESSMENT AND PLAN:    1. Menometrorrhagia.  She sees Dr. Arlyce Dice.  Improved with oral birth control. 2. Iron deficiency anemia.  Improved with IV iron last year and oral iron now.  With improvement of menometrorrhagia.  This has also improved.  3. Thrombocytosis.  This was secondary to her iron deficiency anemia.  This has resolved.  4. Positive ANA with a very low titer at a ratio of 1:40.  This is most likely reactive and  there has been no need for any further workup on our end.  However, we will defer her PCP.  She has intermittent facial rash.  I advised her to bring this to the attention of her PCP if worsens.  She has relatives with lupus.  5. Followup.  Lab only in 4 and 8 months.  Lab/RV with me in 1 year.    The length of time of the face-to-face encounter was 10 minutes. More than 50% of time was spent counseling and coordination of care.

## 2012-03-10 NOTE — Telephone Encounter (Signed)
Gv pt apppt schedule for April - August - December 2014.

## 2012-03-10 NOTE — Patient Instructions (Addendum)
1.  Diagnosis:  Iron deficiency anemia. 2.  Treatment:  Oral iron as tolerated. 3.  Follow up:  Blood count every 4 months.  Return to clinic in about 1 year.  If continue to have heavy period, please see your Gynecologist to see if there are therapies to improve this.

## 2012-03-12 ENCOUNTER — Ambulatory Visit: Payer: 59 | Admitting: Oncology

## 2012-03-12 ENCOUNTER — Other Ambulatory Visit: Payer: 59 | Admitting: Lab

## 2012-04-02 ENCOUNTER — Ambulatory Visit (INDEPENDENT_AMBULATORY_CARE_PROVIDER_SITE_OTHER): Payer: 59 | Admitting: Family Medicine

## 2012-04-02 VITALS — BP 146/82 | HR 72 | Temp 98.4°F | Resp 16 | Ht 61.0 in | Wt 151.0 lb

## 2012-04-02 DIAGNOSIS — H16009 Unspecified corneal ulcer, unspecified eye: Secondary | ICD-10-CM

## 2012-04-02 DIAGNOSIS — H109 Unspecified conjunctivitis: Secondary | ICD-10-CM

## 2012-04-02 NOTE — Progress Notes (Signed)
Subjective: Patient comes in today with complaints of original on the left. On Monday she tried out some mascara at the store. Yesterday her eye started getting red and painful. Does not wear glasses. No other injuries. She is a Associate Professor. She says that she had noted a little dot on her eye in the past.  Objective: Eyes PERRLA. Left eye is.Lezlie Lye are normal on the right, probably normal the left but hard to visualize well due to her tearing. EOMs intact no foreign body noted. Fluorescein staining was done. Is a tiny dot toward the periphery of the iris on the left eye at the 4:00 position.  Assessment: Acute conjunctivitis with possible early corneal ulceration  Plan: Advise she needs to see an ophthalmologist. Referred her to Dr. Nile Riggs who will see her this afternoon. I did go ahead and put him 2 drops of ofloxacin ophthalmic.

## 2012-04-02 NOTE — Patient Instructions (Addendum)
See Dr. Nile Riggs for ophthalmologic evaluation this afternoon.

## 2012-05-27 ENCOUNTER — Ambulatory Visit (INDEPENDENT_AMBULATORY_CARE_PROVIDER_SITE_OTHER): Payer: 59 | Admitting: Physician Assistant

## 2012-05-27 VITALS — BP 118/76 | HR 87 | Temp 98.1°F | Resp 16 | Ht 61.0 in | Wt 145.8 lb

## 2012-05-27 DIAGNOSIS — B349 Viral infection, unspecified: Secondary | ICD-10-CM

## 2012-05-27 DIAGNOSIS — B9789 Other viral agents as the cause of diseases classified elsewhere: Secondary | ICD-10-CM

## 2012-05-27 MED ORDER — PROMETHAZINE HCL 25 MG PO TABS: 25.0000 mg | ORAL_TABLET | Freq: Three times a day (TID) | ORAL | Status: DC | PRN

## 2012-05-27 NOTE — Progress Notes (Signed)
  Subjective:    Patient ID: Emily Downs, female    DOB: October 27, 1980, 32 y.o.   MRN: [id number]  HPI 32 yr old AAF presents with not feeling well.  3 days ago she started to feel bad and had 5-6 episodes of diarrhea and continued to feel poorly.  She felt nauseated on and off. She had 2-3 episodes of diarrhea the 2nd day and 1 episode of diarrhea yesterday and today.  She has not had fever.  No abdominal pain. No urinary s/sx. She feels weak.  She is drinking liquids well. She is starting to get her appetite back.  Review of Systems  All other systems reviewed and are negative.       Objective:   Physical Exam  Nursing note and vitals reviewed. Constitutional: She is oriented to person, place, and time. She appears well-developed and well-nourished.  HENT:  Head: Normocephalic and atraumatic.  Right Ear: External ear normal.  Left Ear: External ear normal.  Mouth/Throat: No oropharyngeal exudate (MMM).  Cardiovascular: Normal rate, regular rhythm and normal heart sounds.   Pulmonary/Chest: Effort normal and breath sounds normal.  Abdominal: Soft.  Neurological: She is alert and oriented to person, place, and time.  Skin: Skin is warm and dry.  Psychiatric: She has a normal mood and affect. Her behavior is normal.       Assessment & Plan:  Resolving viral syndrome-continue to push fluids, rest.  OOW X 2 days.

## 2012-05-27 NOTE — Patient Instructions (Signed)
Viral Gastroenteritis Viral gastroenteritis is also called stomach flu. This illness is caused by a certain type of germ (virus). It can cause sudden watery poop (diarrhea) and throwing up (vomiting). This can cause you to lose body fluids (dehydration). This illness usually lasts for 3 to 8 days. It usually goes away on its own. HOME CARE   Drink enough fluids to keep your pee (urine) clear or pale yellow. Drink small amounts of fluids often.  Ask your doctor how to replace body fluid losses (rehydration).  Avoid:  Foods high in sugar.  Alcohol.  Bubbly (carbonated) drinks.  Tobacco.  Juice.  Caffeine drinks.  Very hot or cold fluids.  Fatty, greasy foods.  Eating too much at one time.  Dairy products until 24 to 48 hours after your watery poop stops.  You may eat foods with active cultures (probiotics). They can be found in some yogurts and supplements.  Wash your hands well to avoid spreading the illness.  Only take medicines as told by your doctor. Do not give aspirin to children. Do not take medicines for watery poop (antidiarrheals).  Ask your doctor if you should keep taking your regular medicines.  Keep all doctor visits as told. GET HELP RIGHT AWAY IF:   You cannot keep fluids down.  You do not pee at least once every 6 to 8 hours.  You are short of breath.  You see blood in your poop or throw up. This may look like coffee grounds.  You have belly (abdominal) pain that gets worse or is just in one small spot (localized).  You keep throwing up or having watery poop.  You have a fever.  The patient is a child younger than 3 months, and he or she has a fever.  The patient is a child older than 3 months, and he or she has a fever and problems that do not go away.  The patient is a child older than 3 months, and he or she has a fever and problems that suddenly get worse.  The patient is a baby, and he or she has no tears when crying. MAKE SURE YOU:     Understand these instructions.  Will watch your condition.  Will get help right away if you are not doing well or get worse. Document Released: 08/29/2007 Document Revised: 06/04/2011 Document Reviewed: 12/27/2010 [employer] Patient Information 2013 [employer].  

## 2012-07-09 ENCOUNTER — Other Ambulatory Visit: Payer: 59 | Admitting: Lab

## 2012-07-11 ENCOUNTER — Other Ambulatory Visit: Payer: Self-pay | Admitting: *Deleted

## 2012-07-11 ENCOUNTER — Telehealth: Payer: Self-pay | Admitting: Oncology

## 2012-07-11 NOTE — Telephone Encounter (Signed)
lvm for pt regarding to 4.25.14 lab.Marland KitchenMarland Kitchen

## 2012-07-18 ENCOUNTER — Other Ambulatory Visit (HOSPITAL_BASED_OUTPATIENT_CLINIC_OR_DEPARTMENT_OTHER): Payer: 59

## 2012-07-18 ENCOUNTER — Encounter: Payer: Self-pay | Admitting: Oncology

## 2012-07-18 DIAGNOSIS — D509 Iron deficiency anemia, unspecified: Secondary | ICD-10-CM

## 2012-07-18 LAB — CBC WITH DIFFERENTIAL/PLATELET
Basophils Absolute: 0 10*3/uL (ref 0.0–0.1)
EOS%: 2.3 % (ref 0.0–7.0)
HGB: 11.9 g/dL (ref 11.6–15.9)
MCH: 22.7 pg — ABNORMAL LOW (ref 25.1–34.0)
NEUT#: 3.2 10*3/uL (ref 1.5–6.5)
RDW: 15.5 % — ABNORMAL HIGH (ref 11.2–14.5)
lymph#: 1.6 10*3/uL (ref 0.9–3.3)

## 2012-08-08 ENCOUNTER — Ambulatory Visit (INDEPENDENT_AMBULATORY_CARE_PROVIDER_SITE_OTHER): Payer: 59 | Admitting: Family Medicine

## 2012-08-08 VITALS — BP 138/84 | HR 93 | Temp 98.8°F | Resp 18 | Ht 60.0 in | Wt 149.0 lb

## 2012-08-08 DIAGNOSIS — R5381 Other malaise: Secondary | ICD-10-CM

## 2012-08-08 DIAGNOSIS — R42 Dizziness and giddiness: Secondary | ICD-10-CM

## 2012-08-08 DIAGNOSIS — R030 Elevated blood-pressure reading, without diagnosis of hypertension: Secondary | ICD-10-CM

## 2012-08-08 DIAGNOSIS — R51 Headache: Secondary | ICD-10-CM

## 2012-08-08 DIAGNOSIS — I10 Essential (primary) hypertension: Secondary | ICD-10-CM

## 2012-08-08 LAB — POCT CBC
HCT, POC: 41.9 % (ref 37.7–47.9)
Hemoglobin: 13 g/dL (ref 12.2–16.2)
Lymph, poc: 2 (ref 0.6–3.4)
MCH, POC: 23.5 pg — AB (ref 27–31.2)
MCHC: 31 g/dL — AB (ref 31.8–35.4)
MCV: 75.6 fL — AB (ref 80–97)
MPV: 9 fL (ref 0–99.8)
POC MID %: 5.7 %M (ref 0–12)
WBC: 6.7 10*3/uL (ref 4.6–10.2)

## 2012-08-08 LAB — POCT URINALYSIS DIPSTICK
Blood, UA: NEGATIVE
Protein, UA: 100
Spec Grav, UA: 1.02
Urobilinogen, UA: 1
pH, UA: 7

## 2012-08-08 LAB — POCT UA - MICROSCOPIC ONLY
Casts, Ur, LPF, POC: NEGATIVE
Crystals, Ur, HPF, POC: NEGATIVE
Mucus, UA: NEGATIVE

## 2012-08-08 LAB — COMPREHENSIVE METABOLIC PANEL
AST: 23 U/L (ref 0–37)
Alkaline Phosphatase: 57 U/L (ref 39–117)
BUN: 12 mg/dL (ref 6–23)
Creat: 0.8 mg/dL (ref 0.50–1.10)

## 2012-08-08 NOTE — Patient Instructions (Addendum)

## 2012-08-08 NOTE — Progress Notes (Signed)
Patient scheduled 6 month follow up with Dr Katrinka Blazing from 11/17 @ 9am.

## 2012-08-08 NOTE — Progress Notes (Signed)
[address]   [place], [place]  [address]   [phone]  Subjective:    Patient ID: Emily Downs, female    DOB: 05-12-1980, 32 y.o.   MRN: [id number]  HPI This 32 y.o. female presents for evaluation of elevated blood pressure.  Went to grocery store and checked BP; today 142/97; checked yesterday 138/96.  Feeling dizzy and light-headed for past four days so thought should check blood pressure.  Started with headache four days ago; then developed dizzy and light headed the following day.  HA was moderate.  No blurred vision; mild light headedness.  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  Left handed.  No weakness focal.  +fatigued/malaise. No fever.  No cold symptoms; no GI symptoms.  Just finished period.  +contraception; +sexually active; +married. No urinary symptoms; +increased frequency of urination but has increased fluid intake.  History of borderline blood pressure in past; weight loss and dietary changes improved BP in past.  Now has a child and not able to fit in exercise. Tingling in fingertips includes L thumb.     Review of Systems  Constitutional: Positive for fatigue. Negative for fever, chills and diaphoresis.  HENT: Negative for nosebleeds, congestion, rhinorrhea, sneezing and postnasal drip.   Respiratory: Positive for shortness of breath. Negative for cough, wheezing and stridor.   Cardiovascular: Negative for chest pain, palpitations and leg swelling.  Gastrointestinal: Negative for nausea, vomiting, abdominal pain and diarrhea.  Genitourinary: Positive for frequency. Negative for dysuria, urgency, hematuria and flank pain.  Skin: Negative for rash.  Neurological: Positive for light-headedness and headaches. Negative for dizziness, tremors, seizures, syncope, facial asymmetry, speech difficulty, weakness and numbness.  Psychiatric/Behavioral: Negative for confusion and dysphoric mood. The patient is  not nervous/anxious.         Past Medical History  Diagnosis Date  . Menometrorrhagia   . Iron deficiency anemia   . Allergy   . Preeclampsia     History reviewed. No pertinent past surgical history.  Prior to Admission medications   Medication Sig Start Date End Date Taking? Authorizing Provider  acyclovir (ZOVIRAX) 400 MG tablet Take 400 mg by mouth Daily. 01/21/12  Yes Historical Provider, MD  cetirizine (ZYRTEC) 10 MG tablet Take 10 mg by mouth daily.     Yes Historical Provider, MD  Iron 66 MG TABS Take 1 tablet by mouth daily.     Yes Historical Provider, MD  Norgestim-Eth Estrad Triphasic (ORTHO TRI-CYCLEN, 28, PO) Take by mouth.   Yes Historical Provider, MD  Prenatal Vit-Fe Fumarate-FA (PRENATAL MULTIVITAMIN) TABS Take 1 tablet by mouth daily. 02/19/12  Yes Eleanore E Egan, PA-C  fluticasone (FLONASE) 50 MCG/ACT nasal spray Place 2 sprays into the nose daily. 12/31/11   Anders Simmonds, PA-C    No Known Allergies  History   Social History  . Marital Status: Single    Spouse Name: N/A    Number of Children: N/A  . Years of Education: N/A   Occupational History  . Not on file.   Social History Main Topics  . Smoking status: Never Smoker   . Smokeless tobacco: Never Used  . Alcohol Use: Yes  . Drug Use: No  . Sexually Active: Yes    Birth Control/ Protection: Pill   Other Topics Concern  . Not on file   Social History Narrative   Marital status: married      Children: 63 yo daughter.  Lives: with husband, daughter.      Employment:  Hairdresser full time      Tobacco; none      Alcohol: rare      Drugs:  None      Exercise: none    Family History  Problem Relation Age of Onset  . Hypertension Mother   . Diabetes Mother   . Aneurysm Mother 41    brain aneurysm  . Hyperlipidemia Father   . Diabetes Maternal Grandmother   . Cancer Paternal Grandmother   . Crohn's disease Sister   . Hypertension Sister     Objective:   Physical Exam    Nursing note and vitals reviewed. Constitutional: She is oriented to person, place, and time. She appears well-developed and well-nourished. She appears distressed.  HENT:  Head: Normocephalic and atraumatic.  Right Ear: External ear normal.  Left Ear: External ear normal.  Nose: Nose normal.  Mouth/Throat: Oropharynx is clear and moist.  Eyes: Conjunctivae and EOM are normal. Pupils are equal, round, and reactive to light.  Neck: Normal range of motion. Neck supple. No thyromegaly present.  Cardiovascular: Normal rate, regular rhythm and normal heart sounds.  Exam reveals no gallop and no friction rub.   No murmur heard. Pulmonary/Chest: Effort normal and breath sounds normal. She has no wheezes. She has no rales.  Abdominal: Soft. Bowel sounds are normal. She exhibits no distension. There is no tenderness. There is no rebound and no guarding.  Lymphadenopathy:    She has no cervical adenopathy.  Neurological: She is alert and oriented to person, place, and time. She has normal reflexes. No cranial nerve deficit. She exhibits normal muscle tone. Coordination normal.  Skin: Skin is warm and dry. No rash noted. She is not diaphoretic.  Psychiatric: She has a normal mood and affect. Her behavior is normal. Judgment and thought content normal.    Results for orders placed in visit on 08/08/12  POCT CBC      Result Value Range   WBC 6.7  4.6 - 10.2 K/uL   Lymph, poc 2.0  0.6 - 3.4   POC LYMPH PERCENT 29.7  10 - 50 %L   MID (cbc) 0.4  0 - 0.9   POC MID % 5.7  0 - 12 %M   POC Granulocyte 4.3  2 - 6.9   Granulocyte percent 64.6  37 - 80 %G   RBC 5.54 (*) 4.04 - 5.48 M/uL   Hemoglobin 13.0  12.2 - 16.2 g/dL   HCT, POC 29.5  62.1 - 47.9 %   MCV 75.6 (*) 80 - 97 fL   MCH, POC 23.5 (*) 27 - 31.2 pg   MCHC 31.0 (*) 31.8 - 35.4 g/dL   RDW, POC 30.8     Platelet Count, POC 343  142 - 424 K/uL   MPV 9.0  0 - 99.8 fL  GLUCOSE, POCT (MANUAL RESULT ENTRY)      Result Value Range   POC  Glucose 62 (*) 70 - 99 mg/dl  POCT UA - MICROSCOPIC ONLY      Result Value Range   WBC, Ur, HPF, POC 0-1     RBC, urine, microscopic neg     Bacteria, U Microscopic trace     Mucus, UA neg     Epithelial cells, urine per micros 0-2     Crystals, Ur, HPF, POC neg     Casts, Ur, LPF, POC neg     Yeast, UA neg  POCT URINALYSIS DIPSTICK      Result Value Range   Color, UA yellow     Clarity, UA cleqar     Glucose, UA neg     Bilirubin, UA neg     Ketones, UA neg     Spec Grav, UA 1.020     Blood, UA neg     pH, UA 7.0     Protein, UA 100     Urobilinogen, UA 1.0     Nitrite, UA neg     Leukocytes, UA Negative    POCT URINE PREGNANCY      Result Value Range   Preg Test, Ur Negative     EKG: NSR.      Assessment & Plan:  Blood pressure elevated without history of HTN - Plan: POCT UA - Microscopic Only, POCT urinalysis dipstick, Comprehensive metabolic panel, TSH, EKG 12-Lead  Headache  Light headed - Plan: POCT CBC, POCT glucose (manual entry), POCT urinalysis dipstick, POCT urine pregnancy  Other malaise and fatigue    1.  Elevated blood pressure/HTN:  Recurrent.  Pt desires six month trial of weight loss, exercise, low-sodium food intake.  Follow-up in six months. 2.  HA:  New.  Mild.  Normal neuro exam; obtain labs.  RTC for acute worsening. 3.  Light headed:  New.  Labs normal; CMET and TSH pending; supportive care with rest, fluids.  RTC for acute worsening. 4. Malaise Fatigue:  New.  Recommend supportive care with rest, fluids.

## 2012-11-10 ENCOUNTER — Other Ambulatory Visit (HOSPITAL_BASED_OUTPATIENT_CLINIC_OR_DEPARTMENT_OTHER): Payer: 59

## 2012-11-10 DIAGNOSIS — D509 Iron deficiency anemia, unspecified: Secondary | ICD-10-CM

## 2012-11-10 LAB — CBC WITH DIFFERENTIAL/PLATELET
Basophils Absolute: 0 10*3/uL (ref 0.0–0.1)
Eosinophils Absolute: 0.1 10*3/uL (ref 0.0–0.5)
HCT: 39.3 % (ref 34.8–46.6)
HGB: 12.5 g/dL (ref 11.6–15.9)
LYMPH%: 29.9 % (ref 14.0–49.7)
MCV: 72.6 fL — ABNORMAL LOW (ref 79.5–101.0)
MONO%: 5.3 % (ref 0.0–14.0)
NEUT#: 4.1 10*3/uL (ref 1.5–6.5)
NEUT%: 62.7 % (ref 38.4–76.8)
Platelets: 348 10*3/uL (ref 145–400)

## 2012-11-12 ENCOUNTER — Encounter: Payer: Self-pay | Admitting: Oncology

## 2012-11-20 ENCOUNTER — Telehealth: Payer: Self-pay | Admitting: Medical Oncology

## 2012-11-20 NOTE — Telephone Encounter (Signed)
I called pt with her CBC results from 11/10/12. I asked her to call if any questions or problems.

## 2013-01-21 ENCOUNTER — Telehealth: Payer: Self-pay | Admitting: Hematology and Oncology

## 2013-01-21 NOTE — Telephone Encounter (Signed)
Moved pt from [place] to [place] pt aware cal mailed shh

## 2013-01-28 ENCOUNTER — Ambulatory Visit (INDEPENDENT_AMBULATORY_CARE_PROVIDER_SITE_OTHER): Payer: 59 | Admitting: Family Medicine

## 2013-01-28 ENCOUNTER — Encounter: Payer: Self-pay | Admitting: Family Medicine

## 2013-01-28 VITALS — BP 138/88 | HR 95 | Temp 99.3°F | Resp 16 | Ht 60.0 in | Wt 144.0 lb

## 2013-01-28 DIAGNOSIS — M25569 Pain in unspecified knee: Secondary | ICD-10-CM

## 2013-01-28 DIAGNOSIS — M25562 Pain in left knee: Secondary | ICD-10-CM

## 2013-01-28 DIAGNOSIS — M222X2 Patellofemoral disorders, left knee: Secondary | ICD-10-CM

## 2013-01-28 DIAGNOSIS — R030 Elevated blood-pressure reading, without diagnosis of hypertension: Secondary | ICD-10-CM

## 2013-01-28 MED ORDER — MELOXICAM 15 MG PO TABS: 15.0000 mg | ORAL_TABLET | Freq: Every day | ORAL | Status: DC

## 2013-01-28 NOTE — Progress Notes (Signed)
[address]   [place], [place]  [address]   [phone]  Subjective:    Patient ID: Emily Downs, female    DOB: [birth year], 32 y.o.   MRN: [id number] This chart was scribed for Emily Chick, MD by Valera Castle, ED Scribe. This patient was seen in room 21 and the patient's care was started at 9:11 AM.  HPI Emily Downs is a 32 y.o. female with a h/o HTN who presents to the [facility] for a follow up visit. She reports being seen here in 07/2012. She reports then that she had high blood pressure, and light headedness.   She states she has occassionally checked her BP, but not as regularly as she should. 124/76 was her last checked BP before today, but is worried after checking her BP this morning after noticing it was raised. She reports that she just left the gym, and is wondering if that is the cause for the current high BP.   She reports having lost weight since her last visit. Her goal is 135 lbs. She reports going to the gym 3-4 times a week, usually running for exercise. She reports gradual, mild left knee pain while running. She thinks that she may have overdone herself with her exercises. She denies taking an anti-inflammatory, but reports taking calcium pills, with some relief. She denies icing the area. 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 She states that if she sits for a long period of time, or has her legs cross, her knee pain will flare up some. She reports initially having gait problems, but states that currently she is being able ambulate normally, just with mild. She denies knee pain when standing.  No longer running; now exercising on elliptical instead with less knee pain.  No popping or giving out; no knee swelling.  She denies heavy periods, and states that the heavy cycles were due to the IUD Paraguard which she no longer has. She reports having had a ParaGuard, for her hormone levels, but disliked it. She reports being on her  birth control pill regularly. She reports her pap smear is UTD. She denies having a blood pressure cup at home, but states that she can get one fairly easily.   She reports taking Zyrtec and Flonase as needed. She reports stating that Valtrex worked better than her than Acyclovir for genital herpes suppression. She will check with Dr. Arlyce Dice about getting back on Valtrex.   She denies light headedness, chest pain, palpitations, LE swelling, and any other associated symptoms.  9:30 AM - BP Recheck 128/82   Patient Active Problem List   Diagnosis Date Noted  . Menometrorrhagia   . Iron deficiency anemia    Past Medical History  Diagnosis Date  . Menometrorrhagia   . Iron deficiency anemia   . Allergy   . Preeclampsia    No past surgical history on file. No Known Allergies  Prior to Admission medications   Medication Sig Start Date End Date Taking? Authorizing Provider  acyclovir (ZOVIRAX) 400 MG tablet Take 400 mg by mouth Daily. 01/21/12   Historical Provider, MD  cetirizine (ZYRTEC) 10 MG tablet Take 10 mg by mouth daily.      Historical Provider, MD  fluticasone (FLONASE) 50 MCG/ACT nasal spray Place 2 sprays into the nose daily. 12/31/11   Anders Simmonds, PA-C  Iron 66 MG TABS Take 1 tablet by mouth daily.      Historical Provider,  MD  Norgestim-Eth Charlott Holler Triphasic (ORTHO TRI-CYCLEN, 28, PO) Take by mouth.    Historical Provider, MD  Prenatal Vit-Fe Fumarate-FA (PRENATAL MULTIVITAMIN) TABS Take 1 tablet by mouth daily. 02/19/12   Godfrey Pick, PA-C    Review of Systems  Constitutional: Positive for activity change (increased). Negative for fatigue and unexpected weight change.  Respiratory: Negative.  Negative for shortness of breath.   Cardiovascular: Negative for chest pain, palpitations and leg swelling.  Genitourinary: Negative for vaginal bleeding and menstrual problem.  Musculoskeletal: Positive for arthralgias (left knee). Negative for gait problem and joint  swelling.  Neurological: Negative for dizziness, syncope, weakness, light-headedness, numbness and headaches.  Psychiatric/Behavioral: Negative for dysphoric mood. The patient is not nervous/anxious.        Objective:   Physical Exam  Nursing note and vitals reviewed. Constitutional: She is oriented to person, place, and time. She appears well-developed and well-nourished. No distress.  HENT:  Head: Normocephalic and atraumatic.  Eyes: Conjunctivae and EOM are normal. Pupils are equal, round, and reactive to light.  Neck: Neck supple. No tracheal deviation present.  Cardiovascular: Normal rate, regular rhythm and normal heart sounds.  Exam reveals no gallop and no friction rub.   No murmur heard. Pulmonary/Chest: Effort normal and breath sounds normal. No respiratory distress. She has no wheezes. She has no rales.  Musculoskeletal: Normal range of motion. She exhibits no tenderness.  Left knee:  non tender to palpation. No swelling. Patellar non tender. Lachman's and Anterior Drawer's negative. V/V strain intact. No joint line TTP.  Squatting with minimal pain.  Neurological: She is alert and oriented to person, place, and time.  Skin: Skin is warm and dry.  Psychiatric: She has a normal mood and affect. Her behavior is normal.    Triage Vitals: BP 138/88  Pulse 95  Temp(Src) 99.3 F (37.4 C) (Oral)  Resp 16  Ht 5' (1.524 m)  Wt 144 lb (65.318 kg)  BMI 28.12 kg/m2  SpO2 99%  LMP 01/14/2013     Assessment & Plan:  Blood pressure elevated without history of HTN  Pain, knee, left  Patellofemoral syndrome, left  1.  Blood pressure elevated without history of HTN:  Stable; slightly improved with exercise, weight loss, dietary modification; recommend monitoring monthly at home; return if remains > 140/90.   2.  Knee pain L:  New. Secondary to overuse injury.  Rx for Mobic provided. 3. Patellofemoral syndrome L:  New. Secondary to overuse injury.  Recommend rest, elevation,  icing, Meloxicam.  Home exercise program provided. If no improvement in one month, call for ortho referral.   Meds ordered this encounter  Medications  . meloxicam (MOBIC) 15 MG tablet    Sig: Take 1 tablet (15 mg total) by mouth daily.    Dispense:  30 tablet    Refill:  0    I personally performed the services described in this documentation, which was scribed in my presence.  The recorded information has been reviewed and is accurate.

## 2013-01-28 NOTE — Patient Instructions (Signed)
Knee Exercises EXERCISES RANGE OF MOTION(ROM) AND STRETCHING EXERCISES These exercises may help you when beginning to rehabilitate your injury. Your symptoms may resolve with or without further involvement from your physician, physical therapist or athletic trainer. While completing these exercises, remember:   Restoring tissue flexibility helps normal motion to return to the joints. This allows healthier, less painful movement and activity.  An effective stretch should be held for at least 30 seconds.  A stretch should never be painful. You should only feel a gentle lengthening or release in the stretched tissue. STRETCH - Knee Extension, Prone  Lie on your stomach on a firm surface, such as a bed or countertop. Place your right / left knee and leg just beyond the edge of the surface. You may wish to place a towel under the far end of your right / left thigh for comfort.  Relax your leg muscles and allow gravity to straighten your knee. Your clinician may advise you to add an ankle weight if more resistance is helpful for you.  You should feel a stretch in the back of your right / left knee. Hold this position for __________ seconds. Repeat __________ times. Complete this stretch __________ times per day. * Your physician, physical therapist or athletic trainer may ask you to add ankle weight to enhance your stretch.  RANGE OF MOTION - Knee Flexion, Active  Lie on your back with both knees straight. (If this causes back discomfort, bend your opposite knee, placing your foot flat on the floor.)  Slowly slide your heel back toward your buttocks until you feel a gentle stretch in the front of your knee or thigh.  Hold for __________ seconds. Slowly slide your heel back to the starting position. Repeat __________ times. Complete this exercise __________ times per day.  STRETCH - Quadriceps, Prone   Lie on your stomach on a firm surface, such as a bed or padded floor.  Bend your right /  left knee and grasp your ankle. If you are unable to reach, your ankle or pant leg, use a belt around your foot to lengthen your reach.  Gently pull your heel toward your buttocks. Your knee should not slide out to the side. You should feel a stretch in the front of your thigh and/or knee.  Hold this position for __________ seconds. Repeat __________ times. Complete this stretch __________ times per day.  STRETCH  Hamstrings, Supine   Lie on your back. Loop a belt or towel over the ball of your right / left foot.  Straighten your right / left knee and slowly pull on the belt to raise your leg. Do not allow the right / left knee to bend. Keep your opposite leg flat on the floor.  Raise the leg until you feel a gentle stretch behind your right / left knee or thigh. Hold this position for __________ seconds. Repeat __________ times. Complete this stretch __________ times per day.  STRENGTHENING EXERCISES These exercises may help you when beginning to rehabilitate your injury. They may resolve your symptoms with or without further involvement from your physician, physical therapist or athletic trainer. While completing these exercises, remember:   Muscles can gain both the endurance and the strength needed for everyday activities through controlled exercises.  Complete these exercises as instructed by your physician, physical therapist or athletic trainer. Progress the resistance and repetitions only as guided.  You may experience muscle soreness or fatigue, but the pain or discomfort you are trying to eliminate should   never worsen during these exercises. If this pain does worsen, stop and make certain you are following the directions exactly. If the pain is still present after adjustments, discontinue the exercise until you can discuss the trouble with your clinician. STRENGTH - Quadriceps, Isometrics  Lie on your back with your right / left leg extended and your opposite knee bent.  Gradually  tense the muscles in the front of your right / left thigh. You should see either your knee cap slide up toward your hip or increased dimpling just above the knee. This motion will push the back of the knee down toward the floor/mat/bed on which you are lying.  Hold the muscle as tight as you can without increasing your pain for __________ seconds.  Relax the muscles slowly and completely in between each repetition. Repeat __________ times. Complete this exercise __________ times per day.  STRENGTH - Quadriceps, Short Arcs   Lie on your back. Place a __________ inch towel roll under your knee so that the knee slightly bends.  Raise only your lower leg by tightening the muscles in the front of your thigh. Do not allow your thigh to rise.  Hold this position for __________ seconds. Repeat __________ times. Complete this exercise __________ times per day.  OPTIONAL ANKLE WEIGHTS: Begin with ____________________, but DO NOT exceed ____________________. Increase in 1 pound/0.5 kilogram increments.  STRENGTH - Quadriceps, Straight Leg Raises  Quality counts! Watch for signs that the quadriceps muscle is working to insure you are strengthening the correct muscles and not "cheating" by substituting with healthier muscles.  Lay on your back with your right / left leg extended and your opposite knee bent.  Tense the muscles in the front of your right / left thigh. You should see either your knee cap slide up or increased dimpling just above the knee. Your thigh may even quiver.  Tighten these muscles even more and raise your leg 4 to 6 inches off the floor. Hold for __________ seconds.  Keeping these muscles tense, lower your leg.  Relax the muscles slowly and completely in between each repetition. Repeat __________ times. Complete this exercise __________ times per day.  STRENGTH - Hamstring, Curls  Lay on your stomach with your legs extended. (If you lay on a bed, your feet may hang over the  edge.)  Tighten the muscles in the back of your thigh to bend your right / left knee up to 90 degrees. Keep your hips flat on the bed/floor.  Hold this position for __________ seconds.  Slowly lower your leg back to the starting position. Repeat __________ times. Complete this exercise __________ times per day.  OPTIONAL ANKLE WEIGHTS: Begin with ____________________, but DO NOT exceed ____________________. Increase in 1 pound/0.5 kilogram increments.  STRENGTH  Quadriceps, Squats  Stand in a door frame so that your feet and knees are in line with the frame.  Use your hands for balance, not support, on the frame.  Slowly lower your weight, bending at the hips and knees. Keep your lower legs upright so that they are parallel with the door frame. Squat only within the range that does not increase your knee pain. Never let your hips drop below your knees.  Slowly return upright, pushing with your legs, not pulling with your hands. Repeat __________ times. Complete this exercise __________ times per day.  STRENGTH - Quadriceps, Wall Slides  Follow guidelines for form closely. Increased knee pain often results from poorly placed feet or knees.  Lean against   a smooth wall or door and walk your feet out 18-24 inches. Place your feet hip-width apart.  Slowly slide down the wall or door until your knees bend __________ degrees.* Keep your knees over your heels, not your toes, and in line with your hips, not falling to either side.  Hold for __________ seconds. Stand up to rest for __________ seconds in between each repetition. Repeat __________ times. Complete this exercise __________ times per day. * Your physician, physical therapist or athletic trainer will alter this angle based on your symptoms and progress. Document Released: 01/24/2005 Document Revised: 06/04/2011 Document Reviewed: 06/24/2008 [employer] Patient Information 2014 [employer].  

## 2013-01-29 ENCOUNTER — Other Ambulatory Visit: Payer: Self-pay

## 2013-02-04 ENCOUNTER — Other Ambulatory Visit: Payer: Self-pay | Admitting: Physician Assistant

## 2013-02-09 ENCOUNTER — Ambulatory Visit: Payer: 59 | Admitting: Family Medicine

## 2013-03-09 ENCOUNTER — Encounter: Payer: Self-pay | Admitting: Hematology and Oncology

## 2013-03-09 ENCOUNTER — Ambulatory Visit: Payer: 59 | Admitting: Hematology and Oncology

## 2013-03-09 ENCOUNTER — Ambulatory Visit: Payer: 59 | Admitting: Oncology

## 2013-03-09 ENCOUNTER — Other Ambulatory Visit: Payer: 59 | Admitting: Lab

## 2013-03-09 ENCOUNTER — Other Ambulatory Visit (HOSPITAL_BASED_OUTPATIENT_CLINIC_OR_DEPARTMENT_OTHER): Payer: 59

## 2013-03-09 ENCOUNTER — Ambulatory Visit (HOSPITAL_BASED_OUTPATIENT_CLINIC_OR_DEPARTMENT_OTHER): Payer: 59 | Admitting: Hematology and Oncology

## 2013-03-09 VITALS — BP 157/89 | HR 70 | Temp 98.5°F | Resp 19 | Ht 60.0 in | Wt 143.9 lb

## 2013-03-09 DIAGNOSIS — D509 Iron deficiency anemia, unspecified: Secondary | ICD-10-CM

## 2013-03-09 DIAGNOSIS — R718 Other abnormality of red blood cells: Secondary | ICD-10-CM

## 2013-03-09 LAB — CBC & DIFF AND RETIC
BASO%: 0.1 % (ref 0.0–2.0)
HCT: 36.9 % (ref 34.8–46.6)
HGB: 12.1 g/dL (ref 11.6–15.9)
Immature Retic Fract: 6.8 % (ref 1.60–10.00)
LYMPH%: 32.4 % (ref 14.0–49.7)
MCHC: 32.8 g/dL (ref 31.5–36.0)
MCV: 70.8 fL — ABNORMAL LOW (ref 79.5–101.0)
MONO#: 0.4 10*3/uL (ref 0.1–0.9)
MONO%: 6.5 % (ref 0.0–14.0)
NEUT%: 58.9 % (ref 38.4–76.8)
Platelets: 357 10*3/uL (ref 145–400)
RBC: 5.21 10*6/uL (ref 3.70–5.45)
WBC: 6.7 10*3/uL (ref 3.9–10.3)

## 2013-03-09 NOTE — Progress Notes (Signed)
[facility] OFFICE PROGRESS NOTE  [name], MD DIAGNOSIS:  Chronic microcytic anemia, history of iron deficiency  SUMMARY OF HEMATOLOGIC HISTORY: This is a pleasant 32 year old lady with history of microcytic anemia, heavy menstruation and iron deficiency anemia. She was given intravenous iron infusion in the past. INTERVAL HISTORY: Barbette Merino 32 y.o. female returns for further followup. Since the last time she was seen here, she was placed on birth control pill. Her heavy menstruation has resolved. She is no longer taking iron supplements. She used to have pica with iron deficiency that has resolved as well. The patient denies any recent signs or symptoms of bleeding such as spontaneous epistaxis, hematuria or hematochezia. She denies any symptoms of anemia such as dizziness, shortness of breath or chest pain.  I have reviewed the past medical history, past surgical history, social history and family history with the patient and they are unchanged from previous note.  ALLERGIES:  has No Known Allergies.  MEDICATIONS:  Current Outpatient Prescriptions  Medication Sig Dispense Refill  . cetirizine (ZYRTEC) 10 MG tablet Take 10 mg by mouth daily.        . fluticasone (FLONASE) 50 MCG/ACT nasal spray USE TWO SPRAY INTO NOSTRIL EVERY DAY  16 g  11  . Norgestim-Eth Estrad Triphasic (ORTHO TRI-CYCLEN, 28, PO) Take by mouth.       No current facility-administered medications for this visit.     REVIEW OF SYSTEMS:   Constitutional: Denies fevers, chills or night sweats Eyes: Denies blurriness of vision Ears, nose, mouth, throat, and face: Denies mucositis or sore throat Respiratory: Denies cough, dyspnea or wheezes Cardiovascular: Denies palpitation, chest discomfort or lower extremity swelling Gastrointestinal:  Denies nausea, heartburn or change in bowel habits Skin: Denies abnormal skin rashes Lymphatics: Denies new lymphadenopathy or easy  bruising Neurological:Denies numbness, tingling or new weaknesses Behavioral/Psych: Mood is stable, no new changes  All other systems were reviewed with the patient and are negative.  PHYSICAL EXAMINATION: ECOG PERFORMANCE STATUS: 0 - Asymptomatic  Filed Vitals:   03/09/13 1519  BP: 157/89  Pulse: 70  Temp: 98.5 F (36.9 C)  Resp: 19   Filed Weights   03/09/13 1519  Weight: 143 lb 14.4 oz (65.273 kg)    GENERAL:alert, no distress and comfortable SKIN: skin color, texture, turgor are normal, no rashes or significant lesions Musculoskeletal:no cyanosis of digits and no clubbing  NEURO: alert & oriented x 3 with fluent speech, no focal motor/sensory deficits  LABORATORY DATA:  I have reviewed the data as listed No results found for this or any previous visit (from the past 48 hour(s)).  Lab Results  Component Value Date   WBC 6.5 11/10/2012   HGB 12.5 11/10/2012   HCT 39.3 11/10/2012   MCV 72.6* 11/10/2012   PLT 348 11/10/2012    ASSESSMENT & PLAN:  #1 chronic microcytosis #2 history of iron deficiency anemia #3 history menorrhagia, resolved The patient had history of menorrhagia causing iron deficiency anemia but since iron infusion and the use of control pill, her iron deficiency anemia has resolved. She remained microcytic with her last few blood tests. In fact, I did not find a normal CBC with normal MCV. I suspect the patient may have of a thalassemia trait as the cause of her microcytosis. I plan to a repeat CBC with differential and with reticulocytes, iron studies and hemoglobinopathy evaluation with hemoglobin electrophoresis. I will call the patient with test results. Since the cause of her iron deficiency  anemia has resolved, she does not need to return to see me in the future. I spent a lot of time educating the patient the natural history of thalassemia. If she does have thalassemia trait, it should not affect her lifestyle. However, it may have some implication  to her daughter that she may be a carrier. All questions were answered. The patient knows to call the clinic with any problems, questions or concerns. No barriers to learning was detected.  I spent 25 minutes counseling the patient face to face. The total time spent in the appointment was 40 minutes and more than 50% was on counseling.     [facility], Amela Handley, MD 03/09/2013 3:40 PM

## 2013-03-10 LAB — IRON AND TIBC CHCC
%SAT: 23 % (ref 21–57)
Iron: 113 ug/dL (ref 41–142)
TIBC: 481 ug/dL — ABNORMAL HIGH (ref 236–444)
UIBC: 368 ug/dL (ref 120–384)

## 2013-03-11 LAB — HEMOGLOBINOPATHY EVALUATION
Hgb A2 Quant: 5.5 % — ABNORMAL HIGH (ref 2.2–3.2)
Hgb A: 93.5 % — ABNORMAL LOW (ref 96.8–97.8)
Hgb F Quant: 1 % (ref 0.0–2.0)

## 2013-03-13 ENCOUNTER — Telehealth: Payer: Self-pay | Admitting: Hematology and Oncology

## 2013-03-13 NOTE — Telephone Encounter (Signed)
A review test result the patient over the phone. The patient is iron deficient although not anemic. I recommend she resume taking oral iron supplements and have her iron studies rechecked with her primary doctor in 3 months. Her hemoglobinopathy evaluation came back positive for beta thalassemia trait. The patient is made aware of this diagnosis.

## 2013-03-15 ENCOUNTER — Other Ambulatory Visit: Payer: Self-pay | Admitting: Physician Assistant

## 2013-03-16 NOTE — Telephone Encounter (Signed)
Dr Katrinka Blazing, I see that you have seen pt for fatigue, but can not tell if you are Rxing the prenatal vit or the oncologist? Please review.

## 2013-03-17 ENCOUNTER — Ambulatory Visit (INDEPENDENT_AMBULATORY_CARE_PROVIDER_SITE_OTHER): Payer: 59 | Admitting: Physician Assistant

## 2013-03-17 VITALS — BP 144/90 | HR 73 | Temp 97.8°F | Resp 16 | Ht 60.0 in | Wt 143.0 lb

## 2013-03-17 DIAGNOSIS — H6591 Unspecified nonsuppurative otitis media, right ear: Secondary | ICD-10-CM

## 2013-03-17 DIAGNOSIS — H7390 Unspecified disorder of tympanic membrane, unspecified ear: Secondary | ICD-10-CM

## 2013-03-17 MED ORDER — OXYMETAZOLINE HCL 0.05 % NA SOLN: 1.0000 | Freq: Two times a day (BID) | NASAL | Status: DC

## 2013-03-17 MED ORDER — CETIRIZINE HCL 10 MG PO TABS: 10.0000 mg | ORAL_TABLET | Freq: Every day | ORAL | Status: DC

## 2013-03-17 MED ORDER — PRENATAL VITAMINS 0.8 MG PO TABS: 1.0000 | ORAL_TABLET | Freq: Every day | ORAL | Status: DC

## 2013-03-17 NOTE — Progress Notes (Signed)
   Subjective:    Patient ID: Emily Downs, female    DOB: [birth year], 32 y.o.   MRN: [id number]  HPI   Emily Downs is a very pleasant 32 yr old female here with concern for right ear pain.  Reports this started about 3 days ago.  Has been stuffy and congested for almost a week now.  Left ear was hurting, but now better.  No drainage from the ear.  No known FB.  No fever.  No hearing loss but both ears feel full and slightly muffled.  Took some Tylenol this AM which did relieve the pain.    BP elevated today.  This has been an issue in the past.  Was seen here about 1 month ago for this.  Trying to avoid medication if possible.  Had made lifestyle changes which improved BP, but admits she has "fallen off" of these.  Does not check home BPs but plans to buy a BP cuff   Review of Systems  Constitutional: Negative for fever and chills.  HENT: Positive for congestion and ear pain. Negative for ear discharge, hearing loss and sore throat.   Respiratory: Negative for cough, shortness of breath and wheezing.   Cardiovascular: Negative.   Gastrointestinal: Negative.   Musculoskeletal: Negative.   Skin: Negative.   Neurological: Positive for headaches.       Objective:   Physical Exam  Vitals reviewed. Constitutional: She is oriented to person, place, and time. She appears well-developed and well-nourished. No distress.  HENT:  Head: Normocephalic and atraumatic.  Right Ear: Ear canal normal. A middle ear effusion is present.  Left Ear: Tympanic membrane and ear canal normal.  Nose: Right sinus exhibits no maxillary sinus tenderness and no frontal sinus tenderness. Left sinus exhibits no maxillary sinus tenderness and no frontal sinus tenderness.  Eyes: Conjunctivae are normal. No scleral icterus.  Neck: Neck supple.  Cardiovascular: Normal rate, regular rhythm and normal heart sounds.   Pulmonary/Chest: Effort normal and breath sounds normal. She has no wheezes. She has no rales.    Lymphadenopathy:    She has no cervical adenopathy.  Neurological: She is alert and oriented to person, place, and time.  Skin: Skin is warm and dry.  Psychiatric: She has a normal mood and affect. Her behavior is normal.       Assessment & Plan:  Middle ear effusion, right - Plan: cetirizine (ZYRTEC) 10 MG tablet, oxymetazoline (AFRIN) 0.05 % nasal spray   Emily Downs is a very pleasant 32 yr old female here with right mid ear effusion.  No evidence of infection today.  Will try Afrin nasal spray BID x 3 days.  Will avoid systemic decongestants due to BP.  Pt is prescribed zyrtec and flonase but has not been taking regularly.  Will restart both of these.  If no improvement pt to RTC.  Will continue to monitor blood pressure.  Pt reports good results with lifestyle mods in the past.  Encouraged pt to check home BPs 1-2x/wk.  If consistently >140/90 will need to discuss adding medication.  Pt understands and agrees with this plan.  Pt also requires RF of prenatal vit, which I have sent  E. Frances Furbish MHS, PA-C [facility] 12/23/20145:51 PM

## 2013-03-17 NOTE — Patient Instructions (Signed)
Begin taking the cetirizine once daily to help with congestion and ear pressure.  Use the oxymetazoline twice daily for the next 3 days to help relieve ear pressure  Restart the Flonase nasal spray as well  If any symptoms are worsening or not improving, please let us know   Serous Otitis Media  Serous otitis media is fluid in the middle ear space. This space contains the bones for hearing and air. Air in the middle ear space helps to transmit sound.  The air gets there through the eustachian tube. This tube goes from the back of the nose (nasopharynx) to the middle ear space. It keeps the pressure in the middle ear the same as the outside world. It also helps to drain fluid from the middle ear space. CAUSES  Serous otitis media occurs when the eustachian tube gets blocked. Blockage can come from:  Ear infections.  Colds and other upper respiratory infections.  Allergies.  Irritants such as cigarette smoke.  Sudden changes in air pressure (such as descending in an airplane).  Enlarged adenoids.  A mass in the nasopharynx. During colds and upper respiratory infections, the middle ear space can become temporarily filled with fluid. This can happen after an ear infection also. Once the infection clears, the fluid will generally drain out of the ear through the eustachian tube. If it does not, then serous otitis media occurs. SIGNS AND SYMPTOMS   Hearing loss.  A feeling of fullness in the ear, without pain.  Young children may not show any symptoms but may show slight behavioral changes, such as agitation, ear pulling, or crying. DIAGNOSIS  Serous otitis media is diagnosed by an ear exam. Tests may be done to check on the movement of the eardrum. Hearing exams may also be done. TREATMENT  The fluid most often goes away without treatment. If allergy is the cause, allergy treatment may be helpful. Fluid that persists for several months may require minor surgery. A small tube is  placed in the eardrum to:  Drain the fluid.  Restore the air in the middle ear space. In certain situations, antibiotics are used to avoid surgery. Surgery may be done to remove enlarged adenoids (if this is the cause). HOME CARE INSTRUCTIONS   Keep children away from tobacco smoke.  Be sure to keep any follow-up appointments. SEEK MEDICAL CARE IF:   Your hearing is not better in 3 months.  Your hearing is worse.  You have ear pain.  You have drainage from the ear.  You have dizziness.  You have serous otitis media only in one ear or have any bleeding from your nose (epistaxis).  You notice a lump on your neck. MAKE SURE YOU:  Understand these instructions.   Will watch your condition.   Will get help right away if you are not doing well or get worse.  Document Released: 06/02/2003 Document Revised: 11/12/2012 Document Reviewed: 10/07/2012 [facility] Patient Information 2014 [place], [place].

## 2013-05-16 ENCOUNTER — Ambulatory Visit (INDEPENDENT_AMBULATORY_CARE_PROVIDER_SITE_OTHER): Payer: 59 | Admitting: Emergency Medicine

## 2013-05-16 VITALS — BP 132/80 | HR 76 | Temp 98.0°F | Resp 18 | Ht 60.5 in | Wt 144.0 lb

## 2013-05-16 DIAGNOSIS — J018 Other acute sinusitis: Secondary | ICD-10-CM

## 2013-05-16 DIAGNOSIS — M940 Chondrocostal junction syndrome [Tietze]: Secondary | ICD-10-CM

## 2013-05-16 DIAGNOSIS — J209 Acute bronchitis, unspecified: Secondary | ICD-10-CM

## 2013-05-16 MED ORDER — PROMETHAZINE-CODEINE 6.25-10 MG/5ML PO SYRP: 5.0000 mL | ORAL_SOLUTION | Freq: Four times a day (QID) | ORAL | Status: DC | PRN

## 2013-05-16 MED ORDER — AMOXICILLIN-POT CLAVULANATE 875-125 MG PO TABS: 1.0000 | ORAL_TABLET | Freq: Two times a day (BID) | ORAL | Status: DC

## 2013-05-16 NOTE — Progress Notes (Signed)
[facility] [address] [place] [address] [phone]  Date:  05/16/2013   Name:  AMARANTA MEHL   DOB:  [birth year]   MRN:  [id number]  PCP:  Hayden Rasmussen., MD    Chief Complaint: Chest Pain   History of Present Illness:  PRARTHANA PARLIN is a 33 y.o. very pleasant female patient who presents with the following:  Ill all week with nasal congestion and mucoid drainage.  Has a cough that is worse at night.  No fever or chills except on Wednesday when had intense myalgias and fatigue.  Cough is mostly not productive.  Has no wheezing or shortness of breath.  Has pleuritic left upper back and right parasternal chest pain.  No nausea or vomiting.  Denies other complaint or health concern today. No improvement with over the counter medications or other home remedies..  Patient Active Problem List   Diagnosis Date Noted  . Menometrorrhagia   . Iron deficiency anemia     Past Medical History  Diagnosis Date  . Menometrorrhagia   . Iron deficiency anemia   . Allergy   . Preeclampsia   . Genital herpes     History reviewed. No pertinent past surgical history.  History  Substance Use Topics  . Smoking status: Never Smoker   . Smokeless tobacco: Never Used  . Alcohol Use: Yes    Family History  Problem Relation Age of Onset  . Hypertension Mother   . Diabetes Mother   . Aneurysm Mother 52    brain aneurysm  . Hyperlipidemia Father   . Diabetes Maternal Grandmother   . Cancer Paternal Grandmother   . Crohn's disease Sister   . Hypertension Sister     No Known Allergies  Medication list has been reviewed and updated.  Current Outpatient Prescriptions on File Prior to Visit  Medication Sig Dispense Refill  . cetirizine (ZYRTEC) 10 MG tablet Take 1 tablet (10 mg total) by mouth daily.  90 tablet  1  . fluticasone (FLONASE) 50 MCG/ACT nasal spray USE TWO SPRAY INTO NOSTRIL EVERY DAY  16 g  11  . Norgestim-Eth Estrad Triphasic (ORTHO  TRI-CYCLEN, 28, PO) Take by mouth.      Marland Kitchen oxymetazoline (AFRIN) 0.05 % nasal spray Place 1 spray into both nostrils 2 (two) times daily. For 3 days only  30 mL  0  . Prenatal Multivit-Min-Fe-FA (PRENATAL VITAMINS) 0.8 MG tablet Take 1 tablet by mouth daily.  90 tablet  1  . prenatal vitamin w/FE, FA (PRENATAL 1 + 1) 27-1 MG TABS tablet TAKE ONE TABLET BY MOUTH EVERY DAY  90 tablet  3  . meloxicam (MOBIC) 15 MG tablet        No current facility-administered medications on file prior to visit.    Review of Systems:  As per HPI, otherwise negative.    Physical Examination: Filed Vitals:   05/16/13 1631  BP: 132/80  Pulse: 76  Temp: 98 F (36.7 C)  Resp: 18   Filed Vitals:   05/16/13 1631  Height: 5' 0.5" (1.537 m)  Weight: 144 lb (65.318 kg)   Body mass index is 27.65 kg/(m^2). Ideal Body Weight: Weight in (lb) to have BMI = 25: 129.9  GEN: WDWN, NAD, Non-toxic, A & O x 3 HEENT: Atraumatic, Normocephalic. Neck supple. No masses, No LAD. Ears and Nose: No external deformity.  Purulent nasal drainage CV: RRR, No M/G/R. No JVD. No thrill. No extra heart sounds. PULM:  CTA B, no wheezes, crackles, rhonchi. No retractions. No resp. distress. No accessory muscle use. Chest:  Right parasternal chest wall tenderness. ABD: S, NT, ND, +BS. No rebound. No HSM. EXTR: No c/c/e NEURO Normal gait.  PSYCH: Normally interactive. Conversant. Not depressed or anxious appearing.  Calm demeanor.    Assessment and Plan: Sinusitis Bronchitis augmentin Phen c cod  Signed,  Ellison Carwin, MD

## 2013-05-16 NOTE — Patient Instructions (Signed)

## 2013-08-04 ENCOUNTER — Ambulatory Visit (INDEPENDENT_AMBULATORY_CARE_PROVIDER_SITE_OTHER): Payer: 59 | Admitting: Physician Assistant

## 2013-08-04 ENCOUNTER — Encounter: Payer: Self-pay | Admitting: Physician Assistant

## 2013-08-04 VITALS — BP 150/100 | HR 69 | Temp 98.3°F | Resp 16 | Ht 59.5 in | Wt 145.0 lb

## 2013-08-04 DIAGNOSIS — I10 Essential (primary) hypertension: Secondary | ICD-10-CM

## 2013-08-04 DIAGNOSIS — D509 Iron deficiency anemia, unspecified: Secondary | ICD-10-CM

## 2013-08-04 LAB — BASIC METABOLIC PANEL
BUN: 14 mg/dL (ref 6–23)
CHLORIDE: 103 meq/L (ref 96–112)
CO2: 26 mEq/L (ref 19–32)
Calcium: 8.9 mg/dL (ref 8.4–10.5)
Creat: 0.85 mg/dL (ref 0.50–1.10)
GLUCOSE: 79 mg/dL (ref 70–99)
Potassium: 4.4 mEq/L (ref 3.5–5.3)
Sodium: 136 mEq/L (ref 135–145)

## 2013-08-04 LAB — CBC WITH DIFFERENTIAL/PLATELET
BASOS ABS: 0 10*3/uL (ref 0.0–0.1)
Basophils Relative: 0 % (ref 0–1)
Eosinophils Absolute: 0.1 10*3/uL (ref 0.0–0.7)
Eosinophils Relative: 2 % (ref 0–5)
HEMATOCRIT: 37.3 % (ref 36.0–46.0)
HEMOGLOBIN: 12.5 g/dL (ref 12.0–15.0)
LYMPHS PCT: 40 % (ref 12–46)
Lymphs Abs: 1.7 10*3/uL (ref 0.7–4.0)
MCH: 22.9 pg — ABNORMAL LOW (ref 26.0–34.0)
MCHC: 33.5 g/dL (ref 30.0–36.0)
MCV: 68.3 fL — ABNORMAL LOW (ref 78.0–100.0)
MONO ABS: 0.3 10*3/uL (ref 0.1–1.0)
MONOS PCT: 8 % (ref 3–12)
NEUTROS ABS: 2.1 10*3/uL (ref 1.7–7.7)
Neutrophils Relative %: 50 % (ref 43–77)
Platelets: 399 10*3/uL (ref 150–400)
RBC: 5.46 MIL/uL — AB (ref 3.87–5.11)
RDW: 16.3 % — ABNORMAL HIGH (ref 11.5–15.5)
WBC: 4.2 10*3/uL (ref 4.0–10.5)

## 2013-08-04 LAB — IRON AND TIBC
%SAT: 21 % (ref 20–55)
Iron: 84 ug/dL (ref 42–145)
TIBC: 404 ug/dL (ref 250–470)
UIBC: 320 ug/dL (ref 125–400)

## 2013-08-04 LAB — FERRITIN: Ferritin: 23 ng/mL (ref 10–291)

## 2013-08-04 MED ORDER — LOSARTAN POTASSIUM 25 MG PO TABS: 25.0000 mg | ORAL_TABLET | Freq: Every day | ORAL | Status: DC

## 2013-08-04 NOTE — Progress Notes (Signed)
Subjective:    Patient ID: Emily Downs, female    DOB: 1980/05/14, 33 y.o.   MRN: [id number]  HPI   Emily Downs is a very pleasant 33 yr old female here with concern for elevated blood pressures.  This has been an ongoing issue - BPs have fluctuated but she has never had a diagnosis of HTN.  She did have preeclampsia with her pregnancy.  She made some lifestyle changes that improved blood pressure for awhile.  Has been under increased stress lately - hopefully closing on a house soon - not eating as well as she should, not working out as much.  She does check home BPs - last night systolic 354, which prompted her to come in.  She brought her monitor with her today to make sure it is properly calibrated.  She has never been on BP meds.  She does have family hx of HTN in mom and sister.  Mom with brain aneurysm due to uncontrolled HTN.  Pt reports a slight HA today but is otherwise asymptomatic.  She denies visual change, chest pain, SOB, edema.    Pt reports she is due to have CBC and iron checked - history of microcytic/iron deficiency anemia - followed by heme  Review of Systems  Constitutional: Negative for fever and chills.  Eyes: Negative for visual disturbance.  Respiratory: Negative for cough, chest tightness, shortness of breath and wheezing.   Cardiovascular: Negative for chest pain and leg swelling.  Gastrointestinal: Negative.   Musculoskeletal: Negative.   Skin: Negative.   Neurological: Positive for headaches.       Objective:   Physical Exam  Vitals reviewed. Constitutional: She is oriented to person, place, and time. She appears well-developed and well-nourished. No distress.  HENT:  Head: Normocephalic and atraumatic.  Eyes: Conjunctivae are normal. No scleral icterus.  Cardiovascular: Normal rate, regular rhythm and normal heart sounds.   Pulmonary/Chest: Effort normal and breath sounds normal. She has no wheezes. She has no rales.  Neurological: She is alert  and oriented to person, place, and time.  Skin: Skin is warm and dry.  Psychiatric: She has a normal mood and affect. Her behavior is normal.   Results for orders placed in visit on 08/04/13  CBC WITH DIFFERENTIAL      Result Value Ref Range   WBC 4.2  4.0 - 10.5 K/uL   RBC 5.46 (*) 3.87 - 5.11 MIL/uL   Hemoglobin 12.5  12.0 - 15.0 g/dL   HCT 37.3  36.0 - 46.0 %   MCV 68.3 (*) 78.0 - 100.0 fL   MCH 22.9 (*) 26.0 - 34.0 pg   MCHC 33.5  30.0 - 36.0 g/dL   RDW 16.3 (*) 11.5 - 15.5 %   Platelets 399  150 - 400 K/uL   Neutrophils Relative % 50  43 - 77 %   Neutro Abs 2.1  1.7 - 7.7 K/uL   Lymphocytes Relative 40  12 - 46 %   Lymphs Abs 1.7  0.7 - 4.0 K/uL   Monocytes Relative 8  3 - 12 %   Monocytes Absolute 0.3  0.1 - 1.0 K/uL   Eosinophils Relative 2  0 - 5 %   Eosinophils Absolute 0.1  0.0 - 0.7 K/uL   Basophils Relative 0  0 - 1 %   Basophils Absolute 0.0  0.0 - 0.1 K/uL   Smear Review Criteria for review not met    BASIC METABOLIC PANEL  Result Value Ref Range   Sodium 136  135 - 145 mEq/L   Potassium 4.4  3.5 - 5.3 mEq/L   Chloride 103  96 - 112 mEq/L   CO2 26  19 - 32 mEq/L   Glucose, Bld 79  70 - 99 mg/dL   BUN 14  6 - 23 mg/dL   Creat 0.85  0.50 - 1.10 mg/dL   Calcium 8.9  8.4 - 10.5 mg/dL  IRON AND TIBC      Result Value Ref Range   Iron 84  42 - 145 ug/dL   UIBC 320  125 - 400 ug/dL   TIBC 404  250 - 470 ug/dL   %SAT 21  20 - 55 %  FERRITIN      Result Value Ref Range   Ferritin 23  10 - 291 ng/mL       Assessment & Plan:  1. Hypertension Emily Downs is a very pleasant 33 yr old female here with consistently elevated BP - today 150/100.  Pt has had some success controlling BP with lifestyle modifications but has lately had persistently elevated BPs.  Pt has been reluctant to start medication, which I understand, but she does understand the potential consequences of uncontrolled HTN.  Will start low dose of losartan today.  Continue checking BPs 3x/wk.   Pt's cuff is accurate within 76mHg.  If she continues to see numbers >140/>90 will increase medication - pt to be in touch by phone or MyChart  - Basic metabolic panel - losartan (COZAAR) 25 MG tablet; Take 1 tablet (25 mg total) by mouth daily.  Dispense: 30 tablet; Refill: 1  2. Iron deficiency anemia Pt with hx iron deficiency anemia, followed by heme.  Pt requests labs to be drawn today.  CBC reveals microcytosis but H/H are WNL.  Additionally iron, TIBC, and ferritin are all WNL.  Last heme note indicates concern for thalassemia but work up has not been done for this.  Will defer thalassemia work up to heme  - CBC with Differential - Iron and TIBC - Ferritin   Pt to call or RTC if worsening or not improving  E. ENatividad BroodMHS, PA-C [facility] 5/13/201512:52 PM

## 2013-08-04 NOTE — Patient Instructions (Signed)
Begin taking the losartan (Cozaar) once daily  Continue monitoring your blood pressure at home 2-3 times per week.  Keep a record of your pressures.  If you continue to see numbers >140/>90 please let me know.  Additionally, if you see numbers <110/<70 please let me know also.  Plan to recheck in about 6 weeks (or at least touch base by phone or mychart)  Hypertension As your heart beats, it forces blood through your arteries. This force is your blood pressure. If the pressure is too high, it is called hypertension (HTN) or high blood pressure. HTN is dangerous because you may have it and not know it. High blood pressure may mean that your heart has to work harder to pump blood. Your arteries may be narrow or stiff. The extra work puts you at risk for heart disease, stroke, and other problems.  Blood pressure consists of two numbers, a higher number over a lower, 110/72, for example. It is stated as "110 over 72." The ideal is below 120 for the top number (systolic) and under 80 for the bottom (diastolic). Write down your blood pressure today. You should pay close attention to your blood pressure if you have certain conditions such as:  Heart failure.  Prior heart attack.  Diabetes  Chronic kidney disease.  Prior stroke.  Multiple risk factors for heart disease. To see if you have HTN, your blood pressure should be measured while you are seated with your arm held at the level of the heart. It should be measured at least twice. A one-time elevated blood pressure reading (especially in the Emergency Department) does not mean that you need treatment. There may be conditions in which the blood pressure is different between your right and left arms. It is important to see your caregiver soon for a recheck. Most people have essential hypertension which means that there is not a specific cause. This type of high blood pressure may be lowered by changing lifestyle factors such  as:  Stress.  Smoking.  Lack of exercise.  Excessive weight.  Drug/tobacco/alcohol use.  Eating less salt. Most people do not have symptoms from high blood pressure until it has caused damage to the body. Effective treatment can often prevent, delay or reduce that damage. TREATMENT  When a cause has been identified, treatment for high blood pressure is directed at the cause. There are a large number of medications to treat HTN. These fall into several categories, and your caregiver will help you select the medicines that are best for you. Medications may have side effects. You should review side effects with your caregiver. If your blood pressure stays high after you have made lifestyle changes or started on medicines,   Your medication(s) may need to be changed.  Other problems may need to be addressed.  Be certain you understand your prescriptions, and know how and when to take your medicine.  Be sure to follow up with your caregiver within the time frame advised (usually within two weeks) to have your blood pressure rechecked and to review your medications.  If you are taking more than one medicine to lower your blood pressure, make sure you know how and at what times they should be taken. Taking two medicines at the same time can result in blood pressure that is too low. SEEK IMMEDIATE MEDICAL CARE IF:  You develop a severe headache, blurred or changing vision, or confusion.  You have unusual weakness or numbness, or a faint feeling.  You have  severe chest or abdominal pain, vomiting, or breathing problems. MAKE SURE YOU:   Understand these instructions.  Will watch your condition.  Will get help right away if you are not doing well or get worse. Document Released: 03/12/2005 Document Revised: 06/04/2011 Document Reviewed: 10/31/2007 [facility] Patient Information 2014 [place].

## 2013-10-13 ENCOUNTER — Telehealth: Payer: Self-pay

## 2013-10-13 NOTE — Telephone Encounter (Signed)
PT STATES THE BP MEDICINE SHE WAS GIVEN ISN'T WORKING AND NEED TO HAVE SOMETHING ELSE CALLED IN. PLEASE CALL PT AT [phone] SHE THINK IT IS THE COZAAR 25MG S    [facility] AT [address]

## 2013-10-13 NOTE — Telephone Encounter (Signed)
Spoke to pt- she stopped taking her BP medication on Friday. She is very irritated that I told her she needed to come into the office to be seen. Her prescription ran out, she did not want to fill it again since it was not working and her my chart has been disabled. I apologized to the patient and asked her if she would like to come in to our walk in clinic- advised her that our wait was reasonable right now. She asked to make an appointment. She is not going to come back today.   I have transferred her to schedule an appt and I have also regenerated a My Chart code for pt- [id number]

## 2013-10-14 ENCOUNTER — Ambulatory Visit (INDEPENDENT_AMBULATORY_CARE_PROVIDER_SITE_OTHER): Payer: 59 | Admitting: Family Medicine

## 2013-10-14 VITALS — BP 122/80 | HR 80 | Temp 98.3°F | Resp 16 | Ht 60.5 in | Wt 147.6 lb

## 2013-10-14 DIAGNOSIS — I10 Essential (primary) hypertension: Secondary | ICD-10-CM

## 2013-10-14 MED ORDER — LOSARTAN POTASSIUM 50 MG PO TABS: 50.0000 mg | ORAL_TABLET | Freq: Every day | ORAL | Status: DC

## 2013-10-14 NOTE — Patient Instructions (Signed)
Increase Losartan to 50 mg daily  Return in 1 month  Monitor BP 1-2 times a week and keep a list of values.

## 2013-10-14 NOTE — Progress Notes (Signed)
Subjective: 33 year old lady who was placed on blood pressure medications a couple of months ago. She's been out for a few days. Her blood pressures have continued to run time when she checks them at home. She was concerned because she had red in the package insert that the medication did not do as well in Afro-American's.  Objective: Blood pressure good today as noted. I repeated it and got 130/84. Did not reexamine her today.  Assessment: Hypertension, borderline control  Plan: Increase the losartan to 50 mg daily. I had done a little research on this, and a review article by the American [employer] Association several years ago noted there was no outcomes difference depending on the choice of medicine or as compared 1 race to another. I feel like it is more factor in her case of not being on very high-dose olmesartan, and that she probably will do well with 50 mg. However she is to recheck if they continue to run high, in which occasion we would probably need to go to losartan HCT or change to amlodipine.

## 2013-11-24 ENCOUNTER — Emergency Department (HOSPITAL_COMMUNITY)
Admission: EM | Admit: 2013-11-24 | Discharge: 2013-11-24 | Disposition: A | Discharge status: Home / Self Care | Payer: 59 | Attending: Emergency Medicine | Admitting: Emergency Medicine

## 2013-11-24 ENCOUNTER — Encounter (HOSPITAL_COMMUNITY): Payer: Self-pay | Admitting: Emergency Medicine

## 2013-11-24 DIAGNOSIS — Z862 Personal history of diseases of the blood and blood-forming organs and certain disorders involving the immune mechanism: Secondary | ICD-10-CM | POA: Diagnosis not present

## 2013-11-24 DIAGNOSIS — I10 Essential (primary) hypertension: Secondary | ICD-10-CM

## 2013-11-24 DIAGNOSIS — Z79899 Other long term (current) drug therapy: Secondary | ICD-10-CM | POA: Diagnosis not present

## 2013-11-24 DIAGNOSIS — Z8742 Personal history of other diseases of the female genital tract: Secondary | ICD-10-CM | POA: Insufficient documentation

## 2013-11-24 DIAGNOSIS — Z3202 Encounter for pregnancy test, result negative: Secondary | ICD-10-CM | POA: Insufficient documentation

## 2013-11-24 DIAGNOSIS — Z8619 Personal history of other infectious and parasitic diseases: Secondary | ICD-10-CM | POA: Diagnosis not present

## 2013-11-24 LAB — BASIC METABOLIC PANEL
Anion gap: 10 (ref 5–15)
BUN: 13 mg/dL (ref 6–23)
CO2: 26 mEq/L (ref 19–32)
Calcium: 8.9 mg/dL (ref 8.4–10.5)
Chloride: 103 mEq/L (ref 96–112)
Creatinine, Ser: 0.9 mg/dL (ref 0.50–1.10)
GFR calc non Af Amer: 82 mL/min — ABNORMAL LOW (ref >90)
GLUCOSE: 92 mg/dL (ref 70–99)
Potassium: 3.5 mEq/L — ABNORMAL LOW (ref 3.7–5.3)
Sodium: 139 mEq/L (ref 137–147)

## 2013-11-24 LAB — URINALYSIS, ROUTINE W REFLEX MICROSCOPIC
Bilirubin Urine: NEGATIVE
GLUCOSE, UA: NEGATIVE mg/dL
Ketones, ur: NEGATIVE mg/dL
LEUKOCYTES UA: NEGATIVE
NITRITE: NEGATIVE
Protein, ur: NEGATIVE mg/dL
Specific Gravity, Urine: 1.009 (ref 1.005–1.030)
Urobilinogen, UA: 0.2 mg/dL (ref 0.0–1.0)
pH: 6.5 (ref 5.0–8.0)

## 2013-11-24 LAB — URINE MICROSCOPIC-ADD ON

## 2013-11-24 LAB — PREGNANCY, URINE: PREG TEST UR: NEGATIVE

## 2013-11-24 MED ORDER — HYDROCHLOROTHIAZIDE 25 MG PO TABS: 25.0000 mg | ORAL_TABLET | Freq: Every day | ORAL | Status: DC

## 2013-11-24 MED ORDER — HYDROCHLOROTHIAZIDE 25 MG PO TABS
25.0000 mg | ORAL_TABLET | Freq: Every day | ORAL | Status: DC
  Administered 2013-11-24: 25 mg via ORAL
  Filled 2013-11-24: qty 1

## 2013-11-24 NOTE — Discharge Instructions (Signed)
DASH Eating Plan °DASH stands for "Dietary Approaches to Stop Hypertension." The DASH eating plan is a healthy eating plan that has been shown to reduce high blood pressure (hypertension). Additional health benefits may include reducing the risk of type 2 diabetes mellitus, heart disease, and stroke. The DASH eating plan may also help with weight loss. °WHAT DO I NEED TO KNOW ABOUT THE DASH EATING PLAN? °For the DASH eating plan, you will follow these general guidelines: °· Choose foods with a percent daily value for sodium of less than 5% (as listed on the food label). °· Use salt-free seasonings or herbs instead of table salt or sea salt. °· Check with your health care provider or pharmacist before using salt substitutes. °· Eat lower-sodium products, often labeled as "lower sodium" or "no salt added." °· Eat fresh foods. °· Eat more vegetables, fruits, and low-fat dairy products. °· Choose whole grains. Look for the word "whole" as the first word in the ingredient list. °· Choose fish and skinless chicken or turkey more often than red meat. Limit fish, poultry, and meat to 6 oz (170 g) each day. °· Limit sweets, desserts, sugars, and sugary drinks. °· Choose heart-healthy fats. °· Limit cheese to 1 oz (28 g) per day. °· Eat more home-cooked food and less restaurant, buffet, and fast food. °· Limit fried foods. °· Cook foods using methods other than frying. °· Limit canned vegetables. If you do use them, rinse them well to decrease the sodium. °· When eating at a restaurant, ask that your food be prepared with less salt, or no salt if possible. °WHAT FOODS CAN I EAT? °Seek help from a dietitian for individual calorie needs. °Grains °Whole grain or whole wheat bread. Brown rice. Whole grain or whole wheat pasta. Quinoa, bulgur, and whole grain cereals. Low-sodium cereals. Corn or whole wheat flour tortillas. Whole grain cornbread. Whole grain crackers. Low-sodium crackers. °Vegetables °Fresh or frozen vegetables  (raw, steamed, roasted, or grilled). Low-sodium or reduced-sodium tomato and vegetable juices. Low-sodium or reduced-sodium tomato sauce and paste. Low-sodium or reduced-sodium canned vegetables.  °Fruits °All fresh, canned (in natural juice), or frozen fruits. °Meat and Other Protein Products °Ground beef (85% or leaner), grass-fed beef, or beef trimmed of fat. Skinless chicken or turkey. Ground chicken or turkey. Pork trimmed of fat. All fish and seafood. Eggs. Dried beans, peas, or lentils. Unsalted nuts and seeds. Unsalted canned beans. °Dairy °Low-fat dairy products, such as skim or 1% milk, 2% or reduced-fat cheeses, low-fat ricotta or cottage cheese, or plain low-fat yogurt. Low-sodium or reduced-sodium cheeses. °Fats and Oils °Tub margarines without trans fats. Light or reduced-fat mayonnaise and salad dressings (reduced sodium). Avocado. Safflower, olive, or canola oils. Natural peanut or almond butter. °Other °Unsalted popcorn and pretzels. °The items listed above may not be a complete list of recommended foods or beverages. Contact your dietitian for more options. °WHAT FOODS ARE NOT RECOMMENDED? °Grains °White bread. White pasta. White rice. Refined cornbread. Bagels and croissants. Crackers that contain trans fat. °Vegetables °Creamed or fried vegetables. Vegetables in a cheese sauce. Regular canned vegetables. Regular canned tomato sauce and paste. Regular tomato and vegetable juices. °Fruits °Dried fruits. Canned fruit in light or heavy syrup. Fruit juice. °Meat and Other Protein Products °Fatty cuts of meat. Ribs, chicken wings, bacon, sausage, bologna, salami, chitterlings, fatback, hot dogs, bratwurst, and packaged luncheon meats. Salted nuts and seeds. Canned beans with salt. °Dairy °Whole or 2% milk, cream, half-and-half, and cream cheese. Whole-fat or sweetened yogurt. Full-fat   cheeses or blue cheese. Nondairy creamers and whipped toppings. Processed cheese, cheese spreads, or cheese  curds. Condiments Onion and garlic salt, seasoned salt, table salt, and sea salt. Canned and packaged gravies. Worcestershire sauce. Tartar sauce. Barbecue sauce. Teriyaki sauce. Soy sauce, including reduced sodium. Steak sauce. Fish sauce. Oyster sauce. Cocktail sauce. Horseradish. Ketchup and mustard. Meat flavorings and tenderizers. Bouillon cubes. Hot sauce. Tabasco sauce. Marinades. Taco seasonings. Relishes. Fats and Oils Butter, stick margarine, lard, shortening, ghee, and bacon fat. Coconut, palm kernel, or palm oils. Regular salad dressings. Other Pickles and olives. Salted popcorn and pretzels. The items listed above may not be a complete list of foods and beverages to avoid. Contact your dietitian for more information. WHERE CAN I FIND MORE INFORMATION? National Heart, Lung, and Blood Institute: travelstabloid.com Document Released: 03/01/2011 Document Revised: 07/27/2013 Document Reviewed: 01/14/2013 [facility] Patient Information 2015 [place], [place]. This information is not intended to replace advice given to you by your health care provider. Make sure you discuss any questions you have with your health care provider.  Managing Your High Blood Pressure Blood pressure is a measurement of how forceful your blood is pressing against the walls of the arteries. Arteries are muscular tubes within the circulatory system. Blood pressure does not stay the same. Blood pressure rises when you are active, excited, or nervous; and it lowers during sleep and relaxation. If the numbers measuring your blood pressure stay above normal most of the time, you are at risk for health problems. High blood pressure (hypertension) is a long-term (chronic) condition in which blood pressure is elevated. A blood pressure reading is recorded as two numbers, such as 120 over 80 (or 120/80). The first, higher number is called the systolic pressure. It is a measure of the pressure in your  arteries as the heart beats. The second, lower number is called the diastolic pressure. It is a measure of the pressure in your arteries as the heart relaxes between beats.  Keeping your blood pressure in a normal range is important to your overall health and prevention of health problems, such as heart disease and stroke. When your blood pressure is uncontrolled, your heart has to work harder than normal. High blood pressure is a very common condition in adults because blood pressure tends to rise with age. Men and women are equally likely to have hypertension but at different times in life. Before age 76, men are more likely to have hypertension. After 35 years of age, women are more likely to have it. Hypertension is especially common in African Americans. This condition often has no signs or symptoms. The cause of the condition is usually not known. Your caregiver can help you come up with a plan to keep your blood pressure in a normal, healthy range. BLOOD PRESSURE STAGES Blood pressure is classified into four stages: normal, prehypertension, stage 1, and stage 2. Your blood pressure reading will be used to determine what type of treatment, if any, is necessary. Appropriate treatment options are tied to these four stages:  Normal  Systolic pressure (mm Hg): below 120.  Diastolic pressure (mm Hg): below 80. Prehypertension  Systolic pressure (mm Hg): 120 to 139.  Diastolic pressure (mm Hg): 80 to 89. Stage1  Systolic pressure (mm Hg): 140 to 159.  Diastolic pressure (mm Hg): 90 to 99. Stage2  Systolic pressure (mm Hg): 160 or above.  Diastolic pressure (mm Hg): 100 or above. RISKS RELATED TO HIGH BLOOD PRESSURE Managing your blood pressure is an important responsibility.  Uncontrolled high blood pressure can lead to:  A heart attack.  A stroke.  A weakened blood vessel (aneurysm).  Heart failure.  Kidney damage.  Eye damage.  Metabolic syndrome.  Memory and concentration  problems. HOW TO MANAGE YOUR BLOOD PRESSURE Blood pressure can be managed effectively with lifestyle changes and medicines (if needed). Your caregiver will help you come up with a plan to bring your blood pressure within a normal range. Your plan should include the following: Education  Read all information provided by your caregivers about how to control blood pressure.  Educate yourself on the latest guidelines and treatment recommendations. New research is always being done to further define the risks and treatments for high blood pressure. Lifestylechanges  Control your weight.  Avoid smoking.  Stay physically active.  Reduce the amount of salt in your diet.  Reduce stress.  Control any chronic conditions, such as high cholesterol or diabetes.  Reduce your alcohol intake. Medicines  Several medicines (antihypertensive medicines) are available, if needed, to bring blood pressure within a normal range. Communication  Review all the medicines you take with your caregiver because there may be side effects or interactions.  Talk with your caregiver about your diet, exercise habits, and other lifestyle factors that may be contributing to high blood pressure.  See your caregiver regularly. Your caregiver can help you create and adjust your plan for managing high blood pressure. RECOMMENDATIONS FOR TREATMENT AND FOLLOW-UP  The following recommendations are based on current guidelines for managing high blood pressure in nonpregnant adults. Use these recommendations to identify the proper follow-up period or treatment option based on your blood pressure reading. You can discuss these options with your caregiver.  Systolic pressure of 702 to 637 or diastolic pressure of 80 to 89: Follow up with your caregiver as directed.  Systolic pressure of 858 to 850 or diastolic pressure of 90 to 100: Follow up with your caregiver within 2 months.  Systolic pressure above 277 or diastolic  pressure above 412: Follow up with your caregiver within 1 month.  Systolic pressure above 878 or diastolic pressure above 676: Consider antihypertensive therapy; follow up with your caregiver within 1 week.  Systolic pressure above 720 or diastolic pressure above 947: Begin antihypertensive therapy; follow up with your caregiver within 1 week. Document Released: 12/05/2011 Document Reviewed: 12/05/2011 [facility] Patient Information 2015 [place]. This information is not intended to replace advice given to you by your health care provider. Make sure you discuss any questions you have with your health care provider.   Hypertension Hypertension, commonly called high blood pressure, is when the force of blood pumping through your arteries is too strong. Your arteries are the blood vessels that carry blood from your heart throughout your body. A blood pressure reading consists of a higher number over a lower number, such as 110/72. The higher number (systolic) is the pressure inside your arteries when your heart pumps. The lower number (diastolic) is the pressure inside your arteries when your heart relaxes. Ideally you want your blood pressure below 120/80. Hypertension forces your heart to work harder to pump blood. Your arteries may become narrow or stiff. Having hypertension puts you at risk for heart disease, stroke, and other problems.  RISK FACTORS Some risk factors for high blood pressure are controllable. Others are not.  Risk factors you cannot control include:   Race. You may be at higher risk if you are African American.  Age. Risk increases with age.  Gender. Men  are at higher risk than women before age 7 years. After age 69, women are at higher risk than men. Risk factors you can control include:  Not getting enough exercise or physical activity.  Being overweight.  Getting too much fat, sugar, calories, or salt in your diet.  Drinking too much alcohol. SIGNS AND  SYMPTOMS Hypertension does not usually cause signs or symptoms. Extremely high blood pressure (hypertensive crisis) may cause headache, anxiety, shortness of breath, and nosebleed. DIAGNOSIS  To check if you have hypertension, your health care provider will measure your blood pressure while you are seated, with your arm held at the level of your heart. It should be measured at least twice using the same arm. Certain conditions can cause a difference in blood pressure between your right and left arms. A blood pressure reading that is higher than normal on one occasion does not mean that you need treatment. If one blood pressure reading is high, ask your health care provider about having it checked again. TREATMENT  Treating high blood pressure includes making lifestyle changes and possibly taking medicine. Living a healthy lifestyle can help lower high blood pressure. You may need to change some of your habits. Lifestyle changes may include:  Following the DASH diet. This diet is high in fruits, vegetables, and whole grains. It is low in salt, red meat, and added sugars.  Getting at least 2 hours of brisk physical activity every week.  Losing weight if necessary.  Not smoking.  Limiting alcoholic beverages.  Learning ways to reduce stress. If lifestyle changes are not enough to get your blood pressure under control, your health care provider may prescribe medicine. You may need to take more than one. Work closely with your health care provider to understand the risks and benefits. HOME CARE INSTRUCTIONS  Have your blood pressure rechecked as directed by your health care provider.   Take medicines only as directed by your health care provider. Follow the directions carefully. Blood pressure medicines must be taken as prescribed. The medicine does not work as well when you skip doses. Skipping doses also puts you at risk for problems.   Do not smoke.   Monitor your blood pressure at  home as directed by your health care provider. SEEK MEDICAL CARE IF:   You think you are having a reaction to medicines taken.  You have recurrent headaches or feel dizzy.  You have swelling in your ankles.  You have trouble with your vision. SEEK IMMEDIATE MEDICAL CARE IF:  You develop a severe headache or confusion.  You have unusual weakness, numbness, or feel faint.  You have severe chest or abdominal pain.  You vomit repeatedly.  You have trouble breathing. MAKE SURE YOU:   Understand these instructions.  Will watch your condition.  Will get help right away if you are not doing well or get worse. Document Released: 03/12/2005 Document Revised: 07/27/2013 Document Reviewed: 01/02/2013 [facility] Patient Information 2015 [place], [place]. This information is not intended to replace advice given to you by your health care provider. Make sure you discuss any questions you have with your health care provider.

## 2013-11-24 NOTE — ED Provider Notes (Signed)
CSN: [id number]     Arrival date & time 11/24/13  0018 History   First MD Initiated Contact with Patient 11/24/13 0038     Chief Complaint  Patient presents with  . Hypertension     (Consider location/radiation/quality/duration/timing/severity/associated sxs/prior Treatment) HPI 33 year old female presents to emergency room from home with complaint of elevated blood pressure.  She reports throughout the week on and off she has had a headache.  She took Tylenol at night, and headache only worsened.  She checked her blood pressure and noticed that it was high.  Patient has been on Cozaar for the last 3 months, increased to 50 mg about a month a half ago.  Patient has family history of high blood pressure, reports her mother had cerebral aneurysm.  She denies any weakness numbness blurred vision.  Headache is global.  She denies any back pain no abdominal pain no chest pain or shortness of breath.  She reports that she takes her Cozaar each night around 11 PM. Past Medical History  Diagnosis Date  . Menometrorrhagia   . Iron deficiency anemia   . Allergy   . Preeclampsia   . Genital herpes    History reviewed. No pertinent past surgical history. Family History  Problem Relation Age of Onset  . Hypertension Mother   . Diabetes Mother   . Aneurysm Mother 74    brain aneurysm  . Hyperlipidemia Father   . Diabetes Maternal Grandmother   . Cancer Paternal Grandmother   . Crohn's disease Sister   . Hypertension Sister    History  Substance Use Topics  . Smoking status: Never Smoker   . Smokeless tobacco: Never Used  . Alcohol Use: Yes   OB History   Grav Para Term Preterm Abortions TAB SAB Ect Mult Living                 Review of Systems  See History of Present Illness; otherwise all other systems are reviewed and negative   Allergies  Review of patient's allergies indicates no known allergies.  Home Medications   Prior to Admission medications   Medication Sig Start Date  End Date Taking? Authorizing Provider  acetaminophen (TYLENOL) 325 MG tablet Take 650 mg by mouth every 6 (six) hours as needed for mild pain.   Yes Historical Provider, MD  losartan (COZAAR) 50 MG tablet Take 1 tablet (50 mg total) by mouth daily. 10/14/13  Yes Posey Boyer, MD  Norgestim-Eth Radene Journey Triphasic [facility] TRI-CYCLEN, 28, PO) Take 1 tablet by mouth daily.    Yes Historical Provider, MD  Prenatal Multivit-Min-Fe-FA (PRENATAL VITAMINS) 0.8 MG tablet Take 1 tablet by mouth daily. 03/17/13  Yes Eleanore E Egan, PA-C   BP 182/112  Pulse 82  Temp(Src) 98.7 F (37.1 C) (Oral)  Ht 5' (1.524 m)  Wt 147 lb 1.6 oz (66.724 kg)  BMI 28.73 kg/m2  SpO2 100%  LMP 11/19/2013 Physical Exam  Nursing note and vitals reviewed. Constitutional: She is oriented to person, place, and time. She appears well-developed and well-nourished.  HENT:  Head: Normocephalic and atraumatic.  Right Ear: External ear normal.  Left Ear: External ear normal.  Nose: Nose normal.  Mouth/Throat: Oropharynx is clear and moist.  Eyes: Conjunctivae and EOM are normal. Pupils are equal, round, and reactive to light.  Neck: Normal range of motion. Neck supple. No JVD present. No tracheal deviation present. No thyromegaly present.  Cardiovascular: Normal rate, regular rhythm, normal heart sounds and intact distal pulses.  Exam reveals no gallop and no friction rub.   No murmur heard. Pulmonary/Chest: Effort normal and breath sounds normal. No stridor. No respiratory distress. She has no wheezes. She has no rales. She exhibits no tenderness.  Abdominal: Soft. Bowel sounds are normal. She exhibits no distension and no mass. There is no tenderness. There is no rebound and no guarding.  Musculoskeletal: Normal range of motion. She exhibits no edema and no tenderness.  Lymphadenopathy:    She has no cervical adenopathy.  Neurological: She is alert and oriented to person, place, and time. She has normal reflexes. No cranial  nerve deficit. She exhibits normal muscle tone. Coordination normal.  Skin: Skin is warm and dry. No rash noted. No erythema. No pallor.  Psychiatric: She has a normal mood and affect. Her behavior is normal. Judgment and thought content normal.    ED Course  Procedures (including critical care time) Labs Review Labs Reviewed  BASIC METABOLIC PANEL - Abnormal; Notable for the following:    Potassium 3.5 (*)    GFR calc non Af Amer 82 (*)    All other components within normal limits  URINALYSIS, ROUTINE W REFLEX MICROSCOPIC - Abnormal; Notable for the following:    Hgb urine dipstick TRACE (*)    All other components within normal limits  URINE MICROSCOPIC-ADD ON - Abnormal; Notable for the following:    Squamous Epithelial / LPF FEW (*)    All other components within normal limits  PREGNANCY, URINE    Imaging Review No results found.   EKG Interpretation   Date/Time:  Tuesday November 24 2013 01:20:09 EDT Ventricular Rate:  69 PR Interval:  177 QRS Duration: 92 QT Interval:  381 QTC Calculation: 408 R Axis:   50 Text Interpretation:  Sinus rhythm No old tracing to compare Confirmed by  Rachelanne Whidby  MD, Rondrick Barreira ([id number]) on 11/24/2013 1:28:24 AM      MDM   Final diagnoses:  Essential hypertension   33 year old female with hypertension.  Patient seen at urgent care.  Per their notes, if Cozaar at 50 mg not improving blood pressure, consider switching to Cozaar/hydrochlorothiazide or changing to Norvasc.  Will add on hydrochlorothiazide at this time.  Patient will be instructed to followup with her doctor within a week for recheck and to continue taking blood pressures twice a day keeping a log.  No endorgan damage noted.  Patient stable for discharge home.  Kalman Drape, MD 11/24/13 [phone]

## 2013-11-24 NOTE — ED Notes (Signed)
Noticed BP has been elevated lately, tonight had a slight headache and checked her pressure.  Highest at home 191/120; felt she should come in to have it checked.  Has had bp med increased last month.  Has been on HTN meds for approx 3 months.

## 2013-12-09 ENCOUNTER — Ambulatory Visit (INDEPENDENT_AMBULATORY_CARE_PROVIDER_SITE_OTHER): Payer: 59 | Admitting: Family Medicine

## 2013-12-09 ENCOUNTER — Encounter: Payer: Self-pay | Admitting: Family Medicine

## 2013-12-09 VITALS — BP 119/82 | HR 74 | Temp 97.6°F | Resp 16 | Ht 60.5 in | Wt 145.0 lb

## 2013-12-09 DIAGNOSIS — E876 Hypokalemia: Secondary | ICD-10-CM

## 2013-12-09 DIAGNOSIS — Z3009 Encounter for other general counseling and advice on contraception: Secondary | ICD-10-CM

## 2013-12-09 DIAGNOSIS — I10 Essential (primary) hypertension: Secondary | ICD-10-CM

## 2013-12-09 MED ORDER — METOPROLOL SUCCINATE ER 25 MG PO TB24: 25.0000 mg | ORAL_TABLET | Freq: Every day | ORAL | Status: DC

## 2013-12-09 NOTE — Patient Instructions (Signed)
1.  Stop Losartan 50mg  daily. 2. Start Metoprolol ER 25mg  one tablet daily.  3.  Monitor heart rate as well; I want it to remain above 60. 4. Call if blood pressure elevates > 140/90.

## 2013-12-09 NOTE — Progress Notes (Signed)
Subjective:    Patient ID: Emily Downs, female    DOB: [birth year], 33 y.o.   MRN: [id number]  This chart was scribed for Wardell Honour, MD by Edison Simon, ED Scribe. This patient was seen in room 21 and the patient's care was started at 3:21 PM.   12/09/2013  Follow-up and Hypertension   HPI HPI Comments: Emily Downs is a 33 y.o. female who presents to the [facility] for 2 week follow up from ED visit on 11/24/2013 for hypertension. She also had potassium measured at 3.5. They started her on hydrochlorothiazide in addition to her Cozaar 50. Her blood pressure was 157/98 at the ED and is measured at 119/82 today. Her blood pressure readings at home are systolic 654-650 and diastolic 35-46. She reports having constipation since starting hydrochlorothiazide. She reports headaches and dizziness for four days after starting hydrochlorothiazide but states she feels fine now. She reports using birth control pills regularly; good compliance with OCPs. She reports previously taking Lisinopril for hypertension; she states she has never used Metoprolol. She reports stress from grad school and her daughter who is having trouble in grade school. She report cramping in her legs and feet intermittently since having her daughter but increased in frequency since 1 month ago. She denies chest pain or swelling in her legs.    Review of Systems  Constitutional: Negative for fever, chills, diaphoresis and fatigue.  Eyes: Negative for visual disturbance.  Respiratory: Negative for cough and shortness of breath.   Cardiovascular: Negative for chest pain, palpitations and leg swelling.  Gastrointestinal: Positive for constipation. Negative for nausea, vomiting, abdominal pain and diarrhea.  Endocrine: Negative for cold intolerance, heat intolerance, polydipsia, polyphagia and polyuria.  Genitourinary: Negative for frequency.  Musculoskeletal:       Cramping in legs and feet    Neurological: Negative for dizziness (resolved), tremors, seizures, syncope, facial asymmetry, speech difficulty, weakness, light-headedness, numbness and headaches (resolved).    Past Medical History  Diagnosis Date  . Menometrorrhagia   . Iron deficiency anemia   . Allergy   . Preeclampsia   . Genital herpes    History reviewed. No pertinent past surgical history. No Known Allergies Current Outpatient Prescriptions  Medication Sig Dispense Refill  . cetirizine (ZYRTEC) 10 MG tablet Take 10 mg by mouth daily.      . hydrochlorothiazide (HYDRODIURIL) 25 MG tablet Take 1 tablet (25 mg total) by mouth daily.  30 tablet  0  . losartan (COZAAR) 50 MG tablet Take 1 tablet (50 mg total) by mouth daily.  30 tablet  2  . Norgestim-Eth Estrad Triphasic (ORTHO TRI-CYCLEN, 28, PO) Take 1 tablet by mouth daily.       . Prenatal Multivit-Min-Fe-FA (PRENATAL VITAMINS) 0.8 MG tablet Take 1 tablet by mouth daily.  90 tablet  1  . acetaminophen (TYLENOL) 325 MG tablet Take 650 mg by mouth every 6 (six) hours as needed for mild pain.      . metoprolol succinate (TOPROL-XL) 25 MG 24 hr tablet Take 1 tablet (25 mg total) by mouth daily.  30 tablet  5   No current facility-administered medications for this visit.   History   Social History  . Marital Status: Single    Spouse Name: N/A    Number of Children: N/A  . Years of Education: N/A   Occupational History  . Not on file.   Social History Main Topics  . Smoking status: Never Smoker   .  Smokeless tobacco: Never Used  . Alcohol Use: Yes     Comment: social  . Drug Use: No  . Sexual Activity: Yes    Birth Control/ Protection: Pill   Other Topics Concern  . Not on file   Social History Narrative   Marital status: married      Children: 19 yo daughter.      Lives: with husband, daughter.      Employment:  Hairdresser full time      Tobacco; none      Alcohol: rare      Drugs:  None      Exercise: none   Family History  Problem  Relation Age of Onset  . Hypertension Mother   . Diabetes Mother   . Aneurysm Mother 56    brain aneurysm  . Hyperlipidemia Father   . Diabetes Maternal Grandmother   . Cancer Paternal Grandmother   . Crohn's disease Sister   . Hypertension Sister        Objective:    BP 119/82  Pulse 74  Temp(Src) 97.6 F (36.4 C) (Oral)  Resp 16  Ht 5' 0.5" (1.537 m)  Wt 145 lb (65.772 kg)  BMI 27.84 kg/m2  SpO2 100%  LMP 11/19/2013 Physical Exam  Nursing note and vitals reviewed. Constitutional: She is oriented to person, place, and time. She appears well-developed and well-nourished. No distress.  HENT:  Head: Normocephalic and atraumatic.  Right Ear: External ear normal.  Left Ear: External ear normal.  Nose: Nose normal.  Mouth/Throat: Oropharynx is clear and moist.  Eyes: Conjunctivae and EOM are normal. Pupils are equal, round, and reactive to light.  Neck: Normal range of motion. Neck supple. Carotid bruit is not present. No tracheal deviation present. No thyromegaly present.  Cardiovascular: Normal rate, regular rhythm, normal heart sounds and intact distal pulses.  Exam reveals no gallop and no friction rub.   No murmur heard. Pulmonary/Chest: Effort normal and breath sounds normal. No respiratory distress. She has no wheezes. She has no rales.  Abdominal: Soft. Bowel sounds are normal. She exhibits no distension and no mass. There is no tenderness. There is no rebound and no guarding.  Musculoskeletal: Normal range of motion. She exhibits no edema.  Lymphadenopathy:    She has no cervical adenopathy.  Neurological: She is alert and oriented to person, place, and time. No cranial nerve deficit.  Skin: Skin is warm and dry. No rash noted. She is not diaphoretic. No erythema. No pallor.  Psychiatric: She has a normal mood and affect. Her behavior is normal.   Results for orders placed in visit on 12/09/13  COMPLETE METABOLIC PANEL WITH GFR      Result Value Ref Range    Sodium 138  135 - 145 mEq/L   Potassium 3.6  3.5 - 5.3 mEq/L   Chloride 102  96 - 112 mEq/L   CO2 31  19 - 32 mEq/L   Glucose, Bld 78  70 - 99 mg/dL   BUN 16  6 - 23 mg/dL   Creat 0.83  0.50 - 1.10 mg/dL   Total Bilirubin 0.2  0.2 - 1.2 mg/dL   Alkaline Phosphatase 46  39 - 117 U/L   AST 26  0 - 37 U/L   ALT 22  0 - 35 U/L   Total Protein 6.5  6.0 - 8.3 g/dL   Albumin 3.6  3.5 - 5.2 g/dL   Calcium 9.2  8.4 - 10.5 mg/dL   GFR,  Est African American >89     GFR, Est Non African American >89         Assessment & Plan:   1. Essential hypertension, benign   2. Hypokalemia    1. HTN: controlled; ED records reviewed.  Recommend STOPPING Losartan in childbearing age female; start Metoprolol ER 25mg  daily; continue HCTZ 25mg  daily. Obtain labs.  Call if BP>140/90 before next visit. 2. Hypokalemia: new in ED; repeat today and normal.   3. Contraception management: discussed OCP and relation to HTN; recommend Mirena IUD or Nexplanon due to HTN.  As patient approaches her forties, recommend switching to progesterone only OCP.   Meds ordered this encounter  Medications  . cetirizine (ZYRTEC) 10 MG tablet    Sig: Take 10 mg by mouth daily.  . metoprolol succinate (TOPROL-XL) 25 MG 24 hr tablet    Sig: Take 1 tablet (25 mg total) by mouth daily.    Dispense:  30 tablet    Refill:  5    Return in about 4 weeks (around 01/06/2014) for recheck blood pressure.   I personally performed the services described in this documentation, which was scribed in my presence.  The recorded information has been reviewed and is accurate.  Reginia Forts, M.D.  [facility] [address] [place], [place]  [address] [phone] phone [phone] fax

## 2013-12-10 LAB — COMPLETE METABOLIC PANEL WITH GFR
ALT: 22 U/L (ref 0–35)
AST: 26 U/L (ref 0–37)
Albumin: 3.6 g/dL (ref 3.5–5.2)
Alkaline Phosphatase: 46 U/L (ref 39–117)
BUN: 16 mg/dL (ref 6–23)
CALCIUM: 9.2 mg/dL (ref 8.4–10.5)
CO2: 31 meq/L (ref 19–32)
CREATININE: 0.83 mg/dL (ref 0.50–1.10)
Chloride: 102 mEq/L (ref 96–112)
GFR, Est Non African American: >89 mL/min
GLUCOSE: 78 mg/dL (ref 70–99)
Potassium: 3.6 mEq/L (ref 3.5–5.3)
Sodium: 138 mEq/L (ref 135–145)
Total Bilirubin: 0.2 mg/dL (ref 0.2–1.2)
Total Protein: 6.5 g/dL (ref 6.0–8.3)

## 2013-12-11 DIAGNOSIS — I10 Essential (primary) hypertension: Secondary | ICD-10-CM | POA: Insufficient documentation

## 2013-12-23 ENCOUNTER — Other Ambulatory Visit: Payer: Self-pay

## 2013-12-23 ENCOUNTER — Telehealth: Payer: Self-pay

## 2013-12-23 MED ORDER — HYDROCHLOROTHIAZIDE 25 MG PO TABS: 25.0000 mg | ORAL_TABLET | Freq: Every day | ORAL | Status: DC

## 2013-12-23 NOTE — Telephone Encounter (Signed)
SMITH - Pt said she went to the hospital and was prescribed hydrochlorothiazide.  She saw you after that and says you told her that you wanted her to continue taking this, but that you did not prescribe her any at the time.  Now she would like you to refill this for her.  [phone]

## 2013-12-23 NOTE — Telephone Encounter (Signed)
Sent in #30- pt is to rtc in October for a recheck. Pt advised.

## 2014-01-06 ENCOUNTER — Ambulatory Visit (INDEPENDENT_AMBULATORY_CARE_PROVIDER_SITE_OTHER): Payer: 59 | Admitting: Family Medicine

## 2014-01-06 ENCOUNTER — Encounter: Payer: Self-pay | Admitting: Family Medicine

## 2014-01-06 VITALS — BP 121/78 | HR 76 | Temp 98.2°F | Resp 16 | Ht 60.5 in | Wt 146.8 lb

## 2014-01-06 DIAGNOSIS — S29019A Strain of muscle and tendon of unspecified wall of thorax, initial encounter: Secondary | ICD-10-CM

## 2014-01-06 DIAGNOSIS — I10 Essential (primary) hypertension: Secondary | ICD-10-CM

## 2014-01-06 DIAGNOSIS — S29012A Strain of muscle and tendon of back wall of thorax, initial encounter: Secondary | ICD-10-CM

## 2014-01-06 NOTE — Progress Notes (Signed)
   Subjective:    Patient ID: Emily Downs, female    DOB: April 29, 1980, 33 y.o.   MRN: [id number]  Hypertension Pertinent negatives include no chest pain, headaches, palpitations or shortness of breath.   Emily Downs is presenting for 1 month follow up on HTN. Checks her BP at home, ranges from 110-130's. At her last visit 11/2013, patient was taken off losartan and switched to metoprolol due to being a woman of childbearing potential and not necessarily compliant with birth control. No changes were made to her HCTZ, reports medication compliance. Denies adverse effects from medications. Diet is okay, "can be eating healthier", plans on starting back up with her exercise. Denies any other aggravating or relieving factors, no other questions or concerns. ROS below.   Review of Systems  Eyes: Negative for visual disturbance.       Denies blurred or double vision.  Respiratory: Negative for cough, chest tightness, shortness of breath and wheezing.   Cardiovascular: Negative for chest pain, palpitations and leg swelling.  Genitourinary: Negative for hematuria and difficulty urinating.  Musculoskeletal:       Denies edema.  Neurological: Negative for dizziness, light-headedness and headaches.       Objective:   Physical Exam  Vitals reviewed. Constitutional: Emily Downs is oriented to person, place, and time. Emily Downs appears well-developed and well-nourished. No distress.  BP 121/78  Pulse 76  Temp(Src) 98.2 F (36.8 C) (Oral)  Resp 16  Ht 5' 0.5" (1.537 m)  Wt 146 lb 12.8 oz (66.588 kg)  BMI 28.19 kg/m2  SpO2 99%  LMP 12/15/2013  BP Readings from Last 3 Encounters: 01/06/14 : 121/78 12/09/13 : 119/82 11/24/13 : 157/98  Wt Readings from Last 3 Encounters: 01/06/14 : 146 lb 12.8 oz (66.588 kg) 12/09/13 : 145 lb (65.772 kg) 11/24/13 : 147 lb 1.6 oz (66.724 kg)   Eyes: Conjunctivae and EOM are normal. Pupils are equal, round, and reactive to light. Right eye exhibits no discharge.  Left eye exhibits no discharge. No scleral icterus.  Cardiovascular: Normal rate, regular rhythm, normal heart sounds and intact distal pulses.  Exam reveals no gallop and no friction rub.   No murmur heard. Pulmonary/Chest: Effort normal and breath sounds normal. No respiratory distress. Emily Downs has no wheezes. Emily Downs has no rales.  Abdominal: Soft. Bowel sounds are normal. Emily Downs exhibits no distension. There is no tenderness.  Musculoskeletal: Normal range of motion. Emily Downs exhibits no edema and no tenderness.  Neurological: Emily Downs is alert and oriented to person, place, and time.  Skin: Skin is warm and dry. Emily Downs is not diaphoretic.  Psychiatric: Emily Downs has a normal mood and affect. Her behavior is normal.   ECG 01/06/2014: Non-specific t-wave abnormalities in V1, V2 otherwise normal sinus rhythm.     Assessment & Plan:   Emily Downs is a 33 y.o. female presenting for 1 month follow up on her HTN following a change in medications from losartan to metoprolol. Emily Downs has done well with this change. BP is controlled. Plan for follow up in 6 months.  1. Essential hypertension, benign Stable, continue metoprolol and HCTZ, baseline ECG today, repeat labs in 6 months. - EKG 12-Lead   Jaynee Eagles, PA-C [facility] [phone] 01/06/2014 12:09 PM

## 2014-01-06 NOTE — Progress Notes (Signed)
History and physical examinations obtained with Rosario Adie, PA-C.  Patient also mentioned onset of L posterior thoracic region two weeks ago after lifting 35 year old daughter out of car.  Pain improved until she lifted garage door last week. Pain now resolved.  Pain did occur with deep inspiration and certain movements. No rash.  No SOB.  A/P: 1. HTN: controlled; no change to management; follow-up six months.  2. L Thoracic Strain: New.  Now improved; recommend OTC Ibuprofen or Aleve PRN; apply heat to area; recommend daily stretching; avoid heavy lifting for next two weeks.

## 2014-01-25 ENCOUNTER — Telehealth: Payer: Self-pay

## 2014-01-25 MED ORDER — HYDROCHLOROTHIAZIDE 25 MG PO TABS: 25.0000 mg | ORAL_TABLET | Freq: Every day | ORAL | Status: DC

## 2014-01-25 NOTE — Telephone Encounter (Signed)
Patient forgot to ask for this to be refilled when she was seen recently at the appointment center: hydrochlorothiazide (HYDRODIURIL) 25 MG tablet  Please call if this can be done. Thank you  Best: [phone]

## 2014-01-27 ENCOUNTER — Telehealth: Payer: Self-pay

## 2014-02-01 ENCOUNTER — Encounter: Payer: 59 | Admitting: Family Medicine

## 2014-03-02 ENCOUNTER — Other Ambulatory Visit: Payer: Self-pay | Admitting: Family Medicine

## 2014-06-03 ENCOUNTER — Other Ambulatory Visit: Payer: Self-pay | Admitting: Family Medicine

## 2014-06-03 ENCOUNTER — Other Ambulatory Visit: Payer: Self-pay | Admitting: Physician Assistant

## 2014-07-05 ENCOUNTER — Encounter: Payer: Self-pay | Admitting: Family Medicine

## 2014-07-05 ENCOUNTER — Ambulatory Visit (INDEPENDENT_AMBULATORY_CARE_PROVIDER_SITE_OTHER): Payer: 59 | Admitting: Family Medicine

## 2014-07-05 VITALS — BP 129/84 | HR 65 | Temp 97.8°F | Resp 16 | Ht 60.5 in | Wt 155.0 lb

## 2014-07-05 DIAGNOSIS — J301 Allergic rhinitis due to pollen: Secondary | ICD-10-CM

## 2014-07-05 DIAGNOSIS — E663 Overweight: Secondary | ICD-10-CM

## 2014-07-05 DIAGNOSIS — I10 Essential (primary) hypertension: Secondary | ICD-10-CM

## 2014-07-05 DIAGNOSIS — D509 Iron deficiency anemia, unspecified: Secondary | ICD-10-CM

## 2014-07-05 LAB — CBC WITH DIFFERENTIAL/PLATELET
BASOS ABS: 0 10*3/uL (ref 0.0–0.1)
BASOS PCT: 0 % (ref 0–1)
Eosinophils Absolute: 0.2 10*3/uL (ref 0.0–0.7)
Eosinophils Relative: 4 % (ref 0–5)
HCT: 37.4 % (ref 36.0–46.0)
Hemoglobin: 12.6 g/dL (ref 12.0–15.0)
Lymphocytes Relative: 41 % (ref 12–46)
Lymphs Abs: 2.1 10*3/uL (ref 0.7–4.0)
MCH: 23.3 pg — AB (ref 26.0–34.0)
MCHC: 33.7 g/dL (ref 30.0–36.0)
MCV: 69.1 fL — ABNORMAL LOW (ref 78.0–100.0)
MPV: 10.2 fL (ref 8.6–12.4)
Monocytes Absolute: 0.4 10*3/uL (ref 0.1–1.0)
Monocytes Relative: 8 % (ref 3–12)
Neutro Abs: 2.4 10*3/uL (ref 1.7–7.7)
Neutrophils Relative %: 47 % (ref 43–77)
Platelets: 371 10*3/uL (ref 150–400)
RBC: 5.41 MIL/uL — ABNORMAL HIGH (ref 3.87–5.11)
RDW: 15.3 % (ref 11.5–15.5)
WBC: 5 10*3/uL (ref 4.0–10.5)

## 2014-07-05 LAB — LIPID PANEL
Cholesterol: 190 mg/dL (ref 0–200)
HDL: 74 mg/dL (ref >=46)
LDL CALC: 104 mg/dL — AB (ref 0–99)
TRIGLYCERIDES: 59 mg/dL (ref <150)
Total CHOL/HDL Ratio: 2.6 Ratio
VLDL: 12 mg/dL (ref 0–40)

## 2014-07-05 LAB — COMPREHENSIVE METABOLIC PANEL
ALT: 21 U/L (ref 0–35)
AST: 26 U/L (ref 0–37)
Albumin: 3.6 g/dL (ref 3.5–5.2)
Alkaline Phosphatase: 49 U/L (ref 39–117)
BUN: 15 mg/dL (ref 6–23)
CO2: 31 mEq/L (ref 19–32)
CREATININE: 0.88 mg/dL (ref 0.50–1.10)
Calcium: 9 mg/dL (ref 8.4–10.5)
Chloride: 99 mEq/L (ref 96–112)
GLUCOSE: 80 mg/dL (ref 70–99)
Potassium: 3.9 mEq/L (ref 3.5–5.3)
SODIUM: 137 meq/L (ref 135–145)
Total Bilirubin: 0.3 mg/dL (ref 0.2–1.2)
Total Protein: 6.6 g/dL (ref 6.0–8.3)

## 2014-07-05 LAB — TSH: TSH: 1.624 u[IU]/mL (ref 0.350–4.500)

## 2014-07-05 MED ORDER — PRENATAL VITAMINS 0.8 MG PO TABS
1.0000 | ORAL_TABLET | Freq: Every day | ORAL | Status: DC
Start: 2014-07-05 — End: 2016-07-31

## 2014-07-05 MED ORDER — METOPROLOL SUCCINATE ER 25 MG PO TB24: 25.0000 mg | ORAL_TABLET | Freq: Every day | ORAL | Status: DC

## 2014-07-05 MED ORDER — FLUTICASONE PROPIONATE 50 MCG/ACT NA SUSP: 2.0000 | Freq: Every day | NASAL | Status: DC

## 2014-07-05 MED ORDER — HYDROCHLOROTHIAZIDE 25 MG PO TABS: 25.0000 mg | ORAL_TABLET | Freq: Every day | ORAL | Status: DC

## 2014-07-05 NOTE — Progress Notes (Signed)
Subjective:    Patient ID: Emily Downs, female    DOB: 07/19/1980, 34 y.o.   MRN: [id number]  07/05/2014  Follow-up; Hypertension; and Medication Refill   HPI This 34 y.o. female presents for six month follow-up:   1.  HTN:  Patient reports good compliance with medication, good tolerance to medication, and good symptom control.  Last check was 102-112/78-82.   Starting exercise today; signed up for a program.   Weight up 9 pounds in six months.  Eight week program; wants to lose 10 pounds in eight weeks.  Denies CP/palp/SOB/leg swelling. Denies HA/dizziness/focal weakness/pareshesias.  2.  Iron deficiency anemia:  Requesting different PNV.    3. Allergic Rhinitis: taking Zyrtec daily; not using Flonase daily.    4.  Contraception: tri-sprintec OCP.  Pap smear not sure; [facility].  Currently on cycle.  Plans on having one more child in upcoming year; would like to wean off of BP medications prior to pregnancy so realizes that must lose weight.     Review of Systems  Constitutional: Negative for fever, chills, diaphoresis and fatigue.  Eyes: Negative for visual disturbance.  Respiratory: Negative for cough and shortness of breath.   Cardiovascular: Negative for chest pain, palpitations and leg swelling.  Gastrointestinal: Negative for nausea, vomiting, abdominal pain, diarrhea and constipation.  Endocrine: Negative for cold intolerance, heat intolerance, polydipsia, polyphagia and polyuria.  Neurological: Negative for dizziness, tremors, seizures, syncope, facial asymmetry, speech difficulty, weakness, light-headedness, numbness and headaches.  Psychiatric/Behavioral: Negative for suicidal ideas, sleep disturbance, self-injury and dysphoric mood. The patient is nervous/anxious.     Past Medical History  Diagnosis Date  . Menometrorrhagia   . Iron deficiency anemia   . Allergy   . Preeclampsia   . Genital herpes    History reviewed. No pertinent past surgical  history. No Known Allergies Current Outpatient Prescriptions  Medication Sig Dispense Refill  . cetirizine (ZYRTEC) 10 MG tablet Take 10 mg by mouth daily.    Marland Kitchen FINACEA 15 % FOAM     . fluticasone (FLONASE) 50 MCG/ACT nasal spray Place 2 sprays into both nostrils daily. 16 g 11  . hydrochlorothiazide (HYDRODIURIL) 25 MG tablet Take 1 tablet (25 mg total) by mouth daily. 30 tablet 11  . metoprolol succinate (TOPROL-XL) 25 MG 24 hr tablet Take 1 tablet (25 mg total) by mouth daily. 30 tablet 11  . TRI-SPRINTEC 0.18/0.215/0.25 MG-35 MCG tablet     . Prenatal Multivit-Min-Fe-FA (PRENATAL VITAMINS) 0.8 MG tablet Take 1 tablet by mouth daily. 100 tablet 3   No current facility-administered medications for this visit.       Objective:    BP 129/84 mmHg  Pulse 65  Temp(Src) 97.8 F (36.6 C)  Resp 16  Ht 5' 0.5" (1.537 m)  Wt 155 lb (70.308 kg)  BMI 29.76 kg/m2  SpO2 100%  LMP 07/01/2014 Physical Exam  Constitutional: She is oriented to person, place, and time. She appears well-developed and well-nourished. No distress.  HENT:  Head: Normocephalic and atraumatic.  Right Ear: External ear normal.  Left Ear: External ear normal.  Nose: Nose normal.  Mouth/Throat: Oropharynx is clear and moist.  Eyes: Conjunctivae and EOM are normal. Pupils are equal, round, and reactive to light.  Neck: Normal range of motion. Neck supple. Carotid bruit is not present. No thyromegaly present.  Cardiovascular: Normal rate, regular rhythm, normal heart sounds and intact distal pulses.  Exam reveals no gallop and no friction rub.   No murmur  heard. Pulmonary/Chest: Effort normal and breath sounds normal. She has no wheezes. She has no rales.  Abdominal: Soft. Bowel sounds are normal. She exhibits no distension and no mass. There is no tenderness. There is no rebound and no guarding.  Lymphadenopathy:    She has no cervical adenopathy.  Neurological: She is alert and oriented to person, place, and time.  No cranial nerve deficit.  Skin: Skin is warm and dry. No rash noted. She is not diaphoretic. No erythema. No pallor.  Psychiatric: She has a normal mood and affect. Her behavior is normal.        Assessment & Plan:   1. Essential hypertension, benign   2. Allergic rhinitis due to pollen   3. Anemia, iron deficiency   4. Overweight (BMI 25.0-29.9)     1. HTN: controlled; obtain labs; refill provided; RTC six months.  Recommend weight loss, exercise, low sodium food choices if desires to wean off of medication prior to conceiving. 2.  Allergic Rhinitis: moderately controlled with Zyrtec; add Flonase; new rx provided.   3.  Anemia iron deficiency: Stable; rx for PNV with iron provided. 4.  Overweight: worsening; weight up nine pounds in six months; highly recommend weight loss, exercise, low-fat and low-caloric food choices. Recommend [email or web address].matthews.[email or web address]   Meds ordered this encounter  Medications  . fluticasone (FLONASE) 50 MCG/ACT nasal spray    Sig: Place 2 sprays into both nostrils daily.    Dispense:  16 g    Refill:  11  . hydrochlorothiazide (HYDRODIURIL) 25 MG tablet    Sig: Take 1 tablet (25 mg total) by mouth daily.    Dispense:  30 tablet    Refill:  11  . metoprolol succinate (TOPROL-XL) 25 MG 24 hr tablet    Sig: Take 1 tablet (25 mg total) by mouth daily.    Dispense:  30 tablet    Refill:  11  . Prenatal Multivit-Min-Fe-FA (PRENATAL VITAMINS) 0.8 MG tablet    Sig: Take 1 tablet by mouth daily.    Dispense:  100 tablet    Refill:  3  . TRI-SPRINTEC 0.18/0.215/0.25 MG-35 MCG tablet    Sig:   . FINACEA 15 % FOAM    Sig:     Return in about 6 months (around 01/04/2015).     Lamarco Gudiel Elayne Guerin, M.D. [facility] [address] [place], [place]  [address] [phone] phone [phone] fax

## 2014-07-05 NOTE — Patient Instructions (Signed)
[email or web address]://www.matthews.info/    Hypertension Hypertension, commonly called high blood pressure, is when the force of blood pumping through your arteries is too strong. Your arteries are the blood vessels that carry blood from your heart throughout your body. A blood pressure reading consists of a higher number over a lower number, such as 110/72. The higher number (systolic) is the pressure inside your arteries when your heart pumps. The lower number (diastolic) is the pressure inside your arteries when your heart relaxes. Ideally you want your blood pressure below 120/80. Hypertension forces your heart to work harder to pump blood. Your arteries may become narrow or stiff. Having hypertension puts you at risk for heart disease, stroke, and other problems.  RISK FACTORS Some risk factors for high blood pressure are controllable. Others are not.  Risk factors you cannot control include:   Race. You may be at higher risk if you are African American.  Age. Risk increases with age.  Gender. Men are at higher risk than women before age 63 years. After age 79, women are at higher risk than men. Risk factors you can control include:  Not getting enough exercise or physical activity.  Being overweight.  Getting too much fat, sugar, calories, or salt in your diet.  Drinking too much alcohol. SIGNS AND SYMPTOMS Hypertension does not usually cause signs or symptoms. Extremely high blood pressure (hypertensive crisis) may cause headache, anxiety, shortness of breath, and nosebleed. DIAGNOSIS  To check if you have hypertension, your health care provider will measure your blood pressure while you are seated, with your arm held at the level of your heart. It should be measured at least twice using the same arm. Certain conditions can cause a difference in blood pressure between your right and left arms. A blood pressure reading that is higher than normal on one occasion does not mean that you need treatment. If one  blood pressure reading is high, ask your health care provider about having it checked again. TREATMENT  Treating high blood pressure includes making lifestyle changes and possibly taking medicine. Living a healthy lifestyle can help lower high blood pressure. You may need to change some of your habits. Lifestyle changes may include:  Following the DASH diet. This diet is high in fruits, vegetables, and whole grains. It is low in salt, red meat, and added sugars.  Getting at least 2 hours of brisk physical activity every week.  Losing weight if necessary.  Not smoking.  Limiting alcoholic beverages.  Learning ways to reduce stress. If lifestyle changes are not enough to get your blood pressure under control, your health care provider may prescribe medicine. You may need to take more than one. Work closely with your health care provider to understand the risks and benefits. HOME CARE INSTRUCTIONS  Have your blood pressure rechecked as directed by your health care provider.   Take medicines only as directed by your health care provider. Follow the directions carefully. Blood pressure medicines must be taken as prescribed. The medicine does not work as well when you skip doses. Skipping doses also puts you at risk for problems.   Do not smoke.   Monitor your blood pressure at home as directed by your health care provider. SEEK MEDICAL CARE IF:   You think you are having a reaction to medicines taken.  You have recurrent headaches or feel dizzy.  You have swelling in your ankles.  You have trouble with your vision. SEEK IMMEDIATE MEDICAL CARE IF:  You develop a  severe headache or confusion.  You have unusual weakness, numbness, or feel faint.  You have severe chest or abdominal pain.  You vomit repeatedly.  You have trouble breathing. MAKE SURE YOU:   Understand these instructions.  Will watch your condition.  Will get help right away if you are not doing well or  get worse. Document Released: 03/12/2005 Document Revised: 07/27/2013 Document Reviewed: 01/02/2013 [facility] Patient Information 2015 [place], [place]. This information is not intended to replace advice given to you by your health care provider. Make sure you discuss any questions you have with your health care provider.

## 2014-08-10 ENCOUNTER — Ambulatory Visit (INDEPENDENT_AMBULATORY_CARE_PROVIDER_SITE_OTHER): Payer: 59 | Admitting: Physician Assistant

## 2014-08-10 VITALS — BP 118/78 | HR 67 | Temp 98.7°F | Resp 12 | Ht 60.0 in | Wt 153.8 lb

## 2014-08-10 DIAGNOSIS — M25562 Pain in left knee: Secondary | ICD-10-CM

## 2014-08-10 DIAGNOSIS — J309 Allergic rhinitis, unspecified: Secondary | ICD-10-CM | POA: Diagnosis not present

## 2014-08-10 DIAGNOSIS — M25561 Pain in right knee: Secondary | ICD-10-CM | POA: Diagnosis not present

## 2014-08-10 DIAGNOSIS — F458 Other somatoform disorders: Secondary | ICD-10-CM | POA: Diagnosis not present

## 2014-08-10 DIAGNOSIS — M542 Cervicalgia: Secondary | ICD-10-CM | POA: Diagnosis not present

## 2014-08-10 DIAGNOSIS — R0989 Other specified symptoms and signs involving the circulatory and respiratory systems: Secondary | ICD-10-CM

## 2014-08-10 DIAGNOSIS — R198 Other specified symptoms and signs involving the digestive system and abdomen: Secondary | ICD-10-CM

## 2014-08-10 NOTE — Patient Instructions (Signed)
Start taking flonase daily with your zyrtec. Take ibuprofen scheduled three times a day for next 7 days. Let me know if you are not getting better in 5-7 days and we will investigate further.

## 2014-08-10 NOTE — Progress Notes (Signed)
Subjective:    Patient ID: Emily Downs, female    DOB: July 11, 1980, 34 y.o.   MRN: [id number]  HPI  This is a 34 year old female who is presenting with anterior neck pain x 3 days. States neck feels sore when she moves certain ways. She also feels something is stuck in her throat when she swallows. When pain started 3 days ago the pain increased as the day went on and has stayed the same since. Does not hurt to swallow. She does not recall the pain starting after eating and she has not eaten anything with small bones, like fish, that may have gotten stuck. Otherwise she is feeling okay. She denies fever, chills, post-nasal drip, nasal congestion, cough. Hasn't taken anything for pain. Does not have problems with heartburn. Been taking zyrtec every day for allergies. She will sometimes add daily flonase when allergies act up but hasn't been taking flonase recently. She is not a smoker but does state she had second hand exposure growing up. She does not have a history of thyroid problems - TSH last checked one month ago and normal.  Pt also stating for years she has had bilateral anterior knee pain. She has started a new work out plan and she is noticing the pain more. In her new work out plan she does a lot of squats and side steps. She has pain with going up steps. Rest makes the pain better. She has less pain with lower impact exercises like the stationary bike. She is wondering what she can do for this pain.  Review of Systems  Constitutional: Negative for fever and chills.  HENT: Negative for congestion, ear pain, sinus pressure, sore throat, trouble swallowing and voice change.   Eyes: Negative for redness.  Respiratory: Negative for cough.   Gastrointestinal: Negative for nausea and vomiting.  Musculoskeletal: Positive for arthralgias and neck pain. Negative for joint swelling and gait problem.  Skin: Negative for rash.  Allergic/Immunologic: Positive for environmental allergies.    Hematological: Negative for adenopathy.   Patient Active Problem List   Diagnosis Date Noted  . Essential hypertension, benign 12/11/2013  . Menometrorrhagia   . Iron deficiency anemia    Prior to Admission medications   Medication Sig Start Date End Date Taking? Authorizing Provider  cetirizine (ZYRTEC) 10 MG tablet Take 10 mg by mouth daily.   Yes Historical Provider, MD  fluticasone (FLONASE) 50 MCG/ACT nasal spray Place 2 sprays into both nostrils daily. 07/05/14  Yes Wardell Honour, MD  hydrochlorothiazide (HYDRODIURIL) 25 MG tablet Take 1 tablet (25 mg total) by mouth daily. 07/05/14  Yes Wardell Honour, MD  metoprolol succinate (TOPROL-XL) 25 MG 24 hr tablet Take 1 tablet (25 mg total) by mouth daily. 07/05/14  Yes Wardell Honour, MD  TRI-SPRINTEC 0.18/0.215/0.25 MG-35 MCG tablet  06/04/14  Yes Historical Provider, MD                 No Known Allergies  Patient's social and family history were reviewed.     Objective:   Physical Exam  Constitutional: She is oriented to person, place, and time. She appears well-developed and well-nourished. No distress.  HENT:  Head: Normocephalic and atraumatic.  Right Ear: Hearing, tympanic membrane, external ear and ear canal normal.  Left Ear: Hearing, tympanic membrane, external ear and ear canal normal.  Nose: Nose normal.  Mouth/Throat: Uvula is midline and mucous membranes are normal. Posterior oropharyngeal erythema present. No oropharyngeal exudate or posterior  oropharyngeal edema.  Eyes: Conjunctivae and lids are normal. Right eye exhibits no discharge. Left eye exhibits no discharge. No scleral icterus.  Neck: Normal range of motion. Neck supple. Tracheal tenderness (cricoid cartilage) present. No spinous process tenderness and no muscular tenderness present. No Brudzinski's sign noted. No thyromegaly present.  Cardiovascular: Normal rate, regular rhythm, normal heart sounds and normal pulses.   No murmur heard. Pulmonary/Chest:  Effort normal and breath sounds normal. No respiratory distress. She has no wheezes. She has no rhonchi. She has no rales.  Musculoskeletal: Normal range of motion.       Right knee: Normal. She exhibits normal range of motion and no swelling. No tenderness found.       Left knee: Normal. She exhibits normal range of motion and no swelling. No tenderness found.       Cervical back: Normal. She exhibits normal range of motion, no tenderness and no bony tenderness.  Lymphadenopathy:       Head (right side): No submental, no submandibular, no tonsillar and no occipital adenopathy present.       Head (left side): No submental, no submandibular, no tonsillar and no occipital adenopathy present.    She has no cervical adenopathy.  Neurological: She is alert and oriented to person, place, and time. She has normal strength. No sensory deficit.  Skin: Skin is warm, dry and intact. No lesion and no rash noted.  Psychiatric: She has a normal mood and affect. Her speech is normal and behavior is normal. Thought content normal.    BP 118/78 mmHg  Pulse 67  Temp(Src) 98.7 F (37.1 C) (Oral)  Resp 12  Ht 5' (1.524 m)  Wt 153 lb 12.8 oz (69.763 kg)  BMI 30.04 kg/m2  SpO2 98%     Assessment & Plan:  1. Anterior neck pain 2. Globus sensation 3. Allergic rhinitis, unspecified allergic rhinitis type Etiology of neck pain likely neck strain vs allergic rhinitis. Pt has started new work out program and possible that neck pain is from increased activity. We discussed low yield of lateral neck film - she agreed to hold off on this for now. She will continue daily zyrtec and add daily flonase. She will take scheduled ibuprofen 600 mg TID for next 1 week. If she is not getting better in 5-7 days she will call or send a mychart message.  4. Bilateral knee pain Knee pain is likely from patellofemoral syndrome. We discussed her options. She will try modifying her exercise with less squatting and impact. I advised  she try ibuprofen prior to exercise. If not getting better in 1-2 months, will send to PT.   Benjaman Pott Drenda Freeze, MHS [facility]  08/10/2014

## 2014-08-10 NOTE — Addendum Note (Signed)
Addended by: Roselee Culver on: 08/10/2014 05:16 PM   Modules accepted: Miquel Dunn

## 2014-08-10 NOTE — Progress Notes (Signed)
  Medical screening examination/treatment/procedure(s) were performed by non-physician practitioner and as supervising physician I was immediately available for consultation/collaboration.     

## 2014-12-20 ENCOUNTER — Ambulatory Visit (INDEPENDENT_AMBULATORY_CARE_PROVIDER_SITE_OTHER): Payer: 59 | Admitting: Physician Assistant

## 2014-12-20 VITALS — BP 130/74 | HR 72 | Temp 98.3°F | Resp 16 | Ht 65.5 in | Wt 155.0 lb

## 2014-12-20 DIAGNOSIS — W57XXXA Bitten or stung by nonvenomous insect and other nonvenomous arthropods, initial encounter: Secondary | ICD-10-CM | POA: Diagnosis not present

## 2014-12-20 DIAGNOSIS — S90861A Insect bite (nonvenomous), right foot, initial encounter: Secondary | ICD-10-CM | POA: Diagnosis not present

## 2014-12-20 DIAGNOSIS — Z23 Encounter for immunization: Secondary | ICD-10-CM | POA: Diagnosis not present

## 2014-12-20 MED ORDER — TRIAMCINOLONE ACETONIDE 0.1 % EX CREA: 1.0000 "application " | TOPICAL_CREAM | Freq: Two times a day (BID) | CUTANEOUS | Status: DC

## 2014-12-20 NOTE — Progress Notes (Signed)
   Patient ID: Emily Downs, female    DOB: [birth year], 34 y.o.   MRN: [id number]  PCP: Reginia Forts, MD  Subjective:   Chief Complaint  Patient presents with  . Insect Bite    bug bites;  x2 weeks; only on right foot by toes  . Pruritis    right foot where bug bite is    HPI Presents for evaluation of bug bites x 2 weeks. She believes that it was ants that bit her. The bites are localized to her right foot and are very itchy. She tried hydrocortisone cream with no relief. No fever or chills.    Review of Systems Constitutional: Negative for fever and chills.  Skin: Positive for rash (Bites to the right foot. ).      Patient Active Problem List   Diagnosis Date Noted  . Essential hypertension, benign 12/11/2013  . Menometrorrhagia   . Iron deficiency anemia      Prior to Admission medications   Medication Sig Start Date End Date Taking? Authorizing Jernee Murtaugh  cetirizine (ZYRTEC) 10 MG tablet Take 10 mg by mouth daily.   Yes Historical Terecia Plaut, MD  fluticasone (FLONASE) 50 MCG/ACT nasal spray Place 2 sprays into both nostrils daily. 07/05/14  Yes Wardell Honour, MD  hydrochlorothiazide (HYDRODIURIL) 25 MG tablet Take 1 tablet (25 mg total) by mouth daily. 07/05/14  Yes Wardell Honour, MD  metoprolol succinate (TOPROL-XL) 25 MG 24 hr tablet Take 1 tablet (25 mg total) by mouth daily. 07/05/14  Yes Wardell Honour, MD  Prenatal Multivit-Min-Fe-FA (PRENATAL VITAMINS) 0.8 MG tablet Take 1 tablet by mouth daily. 07/05/14  Yes Wardell Honour, MD     No Known Allergies     Objective:  Physical Exam  Constitutional: She is oriented to person, place, and time. She appears well-developed and well-nourished. She is active and cooperative. No distress.  BP 130/74 mmHg  Pulse 72  Temp(Src) 98.3 F (36.8 C) (Oral)  Resp 16  Ht 5' 5.5" (1.664 m)  Wt 155 lb (70.308 kg)  BMI 25.39 kg/m2  SpO2 98%  LMP 12/04/2014   Eyes: Conjunctivae are normal.  Pulmonary/Chest: Effort  normal.  Neurological: She is alert and oriented to person, place, and time.  Skin: Skin is warm and dry. Rash noted. Rash is vesicular (RIGHT foot, 3 lesions, two are coalescing).  Psychiatric: She has a normal mood and affect. Her speech is normal and behavior is normal.           Assessment & Plan:   1. Bug bites Consistent with fire ant bites. Anticipatory guidance provided. Continue oral cetirizine. - triamcinolone cream (KENALOG) 0.1 %; Apply 1 application topically 2 (two) times daily.  Dispense: 30 g; Refill: 0  2. Need for Tdap vaccination She decided that she did not want the vaccine today.   Fara Chute, PA-C Physician Assistant-Certified [facility]

## 2014-12-20 NOTE — Progress Notes (Signed)
Subjective:     Patient ID: Emily Downs, female   DOB: 08-22-1980, 34 y.o.   MRN: [id number] PCP: Reginia Forts, MD  Chief Complaint  Patient presents with  . Insect Bite    bug bites;  x2 weeks; only on right foot by toes  . Pruritis    right foot where bug bite is    HPI Patient presents today for evaluation of bug bites x 2 weeks. She believes that it was ants that bit her. The bites are localized to her right foot and are very itchy. She tried hydrocortisone cream with no relief. No fever or chills.   Review of Systems  Constitutional: Negative for fever and chills.  Skin: Positive for rash (Bites to the right foot. ).  See HPI   Patient Active Problem List   Diagnosis Date Noted  . Essential hypertension, benign 12/11/2013  . Menometrorrhagia   . Iron deficiency anemia     Prior to Admission medications   Medication Sig Start Date End Date Taking? Authorizing Provider  cetirizine (ZYRTEC) 10 MG tablet Take 10 mg by mouth daily.   Yes Historical Provider, MD  fluticasone (FLONASE) 50 MCG/ACT nasal spray Place 2 sprays into both nostrils daily. 07/05/14  Yes Wardell Honour, MD  hydrochlorothiazide (HYDRODIURIL) 25 MG tablet Take 1 tablet (25 mg total) by mouth daily. 07/05/14  Yes Wardell Honour, MD  metoprolol succinate (TOPROL-XL) 25 MG 24 hr tablet Take 1 tablet (25 mg total) by mouth daily. 07/05/14  Yes Wardell Honour, MD  Prenatal Multivit-Min-Fe-FA (PRENATAL VITAMINS) 0.8 MG tablet Take 1 tablet by mouth daily. 07/05/14  Yes Wardell Honour, MD           No Known Allergies   Objective:  Physical Exam  Constitutional: She is oriented to person, place, and time. She appears well-developed and well-nourished.  HENT:  Head: Normocephalic and atraumatic.  Cardiovascular: Normal rate and regular rhythm.   Pulmonary/Chest: Effort normal and breath sounds normal.  Neurological: She is alert and oriented to person, place, and time.  Skin: Skin is warm and dry. Rash  noted.     Psychiatric: She has a normal mood and affect. Her behavior is normal. Thought content normal.     BP 130/74 mmHg  Pulse 72  Temp(Src) 98.3 F (36.8 C) (Oral)  Resp 16  Ht 5' 5.5" (1.664 m)  Wt 155 lb (70.308 kg)  BMI 25.39 kg/m2  SpO2 98%  LMP 12/04/2014   Assessment & Plan:  1. Bug bites Triamcinolone for itching. Continue to take Zyrtec daily. Apply ice as needed. Counseled to not pop lesions and keep the areas clean and dry. RTC if fever, chills, redness, warmth, pain, or other symptoms of infection occur.  - triamcinolone cream (KENALOG) 0.1 %; Apply 1 application topically 2 (two) times daily.  Dispense: 30 g; Refill: 0  2. Need for Tdap vaccination   Maygen Sirico D. Race, PA-S Physician Assistant Student [facility]

## 2014-12-20 NOTE — Patient Instructions (Signed)
Insect Bite Mosquitoes, flies, fleas, bedbugs, and many other insects can bite. Insect bites are different from insect stings. A sting is when venom is injected into the skin. Some insect bites can transmit infectious diseases. SYMPTOMS  Insect bites usually turn red, swell, and itch for 2 to 4 days. They often go away on their own. TREATMENT  Your caregiver may prescribe antibiotic medicines if a bacterial infection develops in the bite. HOME CARE INSTRUCTIONS  Do not scratch the bite area.  Keep the bite area clean and dry. Wash the bite area thoroughly with soap and water.  Put ice or cool compresses on the bite area.  Put ice in a plastic bag.  Place a towel between your skin and the bag.  Leave the ice on for 20 minutes, 4 times a day for the first 2 to 3 days, or as directed.  You may apply a baking soda paste, cortisone cream, or calamine lotion to the bite area as directed by your caregiver. This can help reduce itching and swelling.  Only take over-the-counter or prescription medicines as directed by your caregiver. Allegra, Zyrtec, or Claritin during the day as needed for itching.  If you are given antibiotics, take them as directed. Finish them even if you start to feel better. You may need a tetanus shot if:  You cannot remember when you had your last tetanus shot.  You have never had a tetanus shot.  The injury broke your skin. If you get a tetanus shot, your arm may swell, get red, and feel warm to the touch. This is common and not a problem. If you need a tetanus shot and you choose not to have one, there is a rare chance of getting tetanus. Sickness from tetanus can be serious. SEEK IMMEDIATE MEDICAL CARE IF:   You have increased pain, redness, or swelling in the bite area.  You see a red line on the skin coming from the bite.  You have a fever.  You have joint pain.  You have a headache or neck pain.  You have unusual weakness.  You have a rash.  You  have chest pain or shortness of breath.  You have abdominal pain, nausea, or vomiting.  You feel unusually tired or sleepy. MAKE SURE YOU:   Understand these instructions.  Will watch your condition.  Will get help right away if you are not doing well or get worse. Document Released: 04/19/2004 Document Revised: 06/04/2011 Document Reviewed: 10/11/2010 [facility] Patient Information 2015 [place], [place]. This information is not intended to replace advice given to you by your health care Uriyah Massimo. Make sure you discuss any questions you have with your health care Aubrie Lucien.

## 2015-01-05 ENCOUNTER — Ambulatory Visit: Payer: Self-pay | Admitting: Family Medicine

## 2015-01-07 ENCOUNTER — Ambulatory Visit: Payer: Self-pay | Admitting: Family Medicine

## 2015-01-10 ENCOUNTER — Ambulatory Visit: Payer: Self-pay | Admitting: Family Medicine

## 2015-01-12 ENCOUNTER — Ambulatory Visit (INDEPENDENT_AMBULATORY_CARE_PROVIDER_SITE_OTHER): Payer: 59

## 2015-01-12 ENCOUNTER — Ambulatory Visit (INDEPENDENT_AMBULATORY_CARE_PROVIDER_SITE_OTHER): Payer: 59 | Admitting: Family Medicine

## 2015-01-12 VITALS — BP 126/80 | HR 74 | Temp 98.6°F | Resp 16 | Ht 60.0 in | Wt 152.0 lb

## 2015-01-12 DIAGNOSIS — J301 Allergic rhinitis due to pollen: Secondary | ICD-10-CM

## 2015-01-12 DIAGNOSIS — I10 Essential (primary) hypertension: Secondary | ICD-10-CM

## 2015-01-12 DIAGNOSIS — R42 Dizziness and giddiness: Secondary | ICD-10-CM | POA: Diagnosis not present

## 2015-01-12 DIAGNOSIS — Z114 Encounter for screening for human immunodeficiency virus [HIV]: Secondary | ICD-10-CM | POA: Diagnosis not present

## 2015-01-12 DIAGNOSIS — M25561 Pain in right knee: Secondary | ICD-10-CM

## 2015-01-12 LAB — POCT URINE PREGNANCY: Preg Test, Ur: NEGATIVE

## 2015-01-12 LAB — COMPREHENSIVE METABOLIC PANEL
ALT: 26 U/L (ref 6–29)
AST: 40 U/L — ABNORMAL HIGH (ref 10–30)
Albumin: 4 g/dL (ref 3.6–5.1)
Alkaline Phosphatase: 68 U/L (ref 33–115)
BUN: 16 mg/dL (ref 7–25)
CHLORIDE: 98 mmol/L (ref 98–110)
CO2: 26 mmol/L (ref 20–31)
Calcium: 9.4 mg/dL (ref 8.6–10.2)
Creat: 1.07 mg/dL (ref 0.50–1.10)
Glucose, Bld: 82 mg/dL (ref 65–99)
POTASSIUM: 4.2 mmol/L (ref 3.5–5.3)
Sodium: 134 mmol/L — ABNORMAL LOW (ref 135–146)
TOTAL PROTEIN: 7.1 g/dL (ref 6.1–8.1)
Total Bilirubin: 0.3 mg/dL (ref 0.2–1.2)

## 2015-01-12 LAB — POCT URINALYSIS DIP (MANUAL ENTRY)
BILIRUBIN UA: NEGATIVE
Bilirubin, UA: NEGATIVE
Glucose, UA: NEGATIVE
LEUKOCYTES UA: NEGATIVE
Nitrite, UA: NEGATIVE
Protein Ur, POC: NEGATIVE
Spec Grav, UA: 1.015
UROBILINOGEN UA: 0.2
pH, UA: 6

## 2015-01-12 LAB — POCT CBC
GRANULOCYTE PERCENT: 54.1 % (ref 37–80)
HEMATOCRIT: 39.3 % (ref 37.7–47.9)
HEMOGLOBIN: 12.8 g/dL (ref 12.2–16.2)
LYMPH, POC: 3.2 (ref 0.6–3.4)
MCH, POC: 23.5 pg — AB (ref 27–31.2)
MCHC: 32.6 g/dL (ref 31.8–35.4)
MCV: 72 fL — AB (ref 80–97)
MID (cbc): 0.5 (ref 0–0.9)
MPV: 7.7 fL (ref 0–99.8)
POC GRANULOCYTE: 4.3 (ref 2–6.9)
POC LYMPH PERCENT: 40.1 %L (ref 10–50)
POC MID %: 5.8 % (ref 0–12)
Platelet Count, POC: 304 10*3/uL (ref 142–424)
RBC: 5.45 M/uL (ref 4.04–5.48)
RDW, POC: 15.9 %
WBC: 7.9 10*3/uL (ref 4.6–10.2)

## 2015-01-12 MED ORDER — METOPROLOL SUCCINATE ER 50 MG PO TB24: 50.0000 mg | ORAL_TABLET | Freq: Every day | ORAL | Status: DC

## 2015-01-12 MED ORDER — HYDROCHLOROTHIAZIDE 12.5 MG PO TABS: 12.5000 mg | ORAL_TABLET | Freq: Every day | ORAL | Status: DC

## 2015-01-12 NOTE — Progress Notes (Addendum)
Subjective:    Patient ID: Emily Downs, female    DOB: Sep 12, 1980, 34 y.o.   MRN: [id number]  01/12/2015  Knee Problem and Follow-up  This chart was scribed for Reginia Forts, MD by Zola Button, Medical Scribe. This patient was seen in Room 9 and the patient's care was started at 4:18 PM.    HPI  HPI Comments: Emily Downs is a 34 y.o. female with a history of hypertension who presents to the [facility] for a follow-up. I saw her last in April. I recommended weight loss in April; today, she is down 3 lbs. She missed her appointment with me this past Monday because she was at her uncle's funeral. Patient declines flu vaccine today; she has not had the flu vaccine in several years.  Hypertension: Her blood pressure has been stable around 113/70s at home; lowest measured blood pressure 113/72.   Recent visit with gynecology who advised that she would need to stop HCTZ if she became pregnant.  Dizziness: She has had some intermittent dizziness that started about a month ago. The episodes happen about 5 times a week and usually last a few minutes. She did have a room-spinning sensation initially, but it has not been bad more recently. She mostly seems to have episodes when bending over or turning her head quickly. She does have seasonal allergies with a small amount of head congestion; however, she notes that her allergies have not been bad. Her LNMP was during the beginning of the month; however, she cannot deny possibility of pregnancy as she does not always use protection with intercouse. Patient denies nausea, vomiting, headache, palpitations, cough, leg swelling, and chest pain. She has been drinking more fluids. Patient states she has been compliant with all of her medications, with the exception of using the Flonase intermittently.  Knee Pain: Patient reports having flare-ups of bilateral knee pain since she started exercising again 2 weeks ago. She has knee  tightness in both knees, worse in her right knee. She has noticed popping in her knees for several years. Her right knee gave out twice today, which is new. She would like XR imaging of her knees to be safe. Patient notes that she has been having problems with her knees with exercise for a while.  Review of Systems  Constitutional: Negative for fever, chills, diaphoresis and fatigue.  HENT: Positive for congestion, postnasal drip and rhinorrhea. Negative for hearing loss, sinus pressure and tinnitus.   Eyes: Negative for visual disturbance.  Respiratory: Negative for cough and shortness of breath.   Cardiovascular: Negative for chest pain, palpitations and leg swelling.  Gastrointestinal: Negative for nausea, vomiting, abdominal pain, diarrhea and constipation.  Endocrine: Negative for cold intolerance, heat intolerance, polydipsia, polyphagia and polyuria.  Musculoskeletal: Positive for arthralgias.  Allergic/Immunologic: Positive for environmental allergies.  Neurological: Positive for dizziness and weakness. Negative for tremors, seizures, syncope, facial asymmetry, speech difficulty, light-headedness, numbness and headaches.    Past Medical History  Diagnosis Date  . Menometrorrhagia   . Iron deficiency anemia   . Allergy   . Genital herpes   . Hypertension    History reviewed. No pertinent past surgical history. No Known Allergies  Social History   Social History  . Marital Status: Single    Spouse Name: N/A  . Number of Children: N/A  . Years of Education: N/A   Occupational History  . Not on file.   Social History Main Topics  . Smoking status:  Never Smoker   . Smokeless tobacco: Never Used  . Alcohol Use: Yes     Comment: social  . Drug Use: No  . Sexual Activity: Yes    Birth Control/ Protection: Pill   Other Topics Concern  . Not on file   Social History Narrative   Marital status: married      Children: 46 yo daughter.      Lives: with husband, daughter.        Employment:  Hairdresser full time      Tobacco; none      Alcohol: rare      Drugs:  None      Exercise: none   Family History  Problem Relation Age of Onset  . Hypertension Mother   . Diabetes Mother   . Aneurysm Mother 99    brain aneurysm  . Hyperlipidemia Father   . Diabetes Maternal Grandmother   . Cancer Paternal Grandmother   . Crohn's disease Sister   . Hypertension Sister        Objective:    BP 126/80 mmHg  Pulse 74  Temp(Src) 98.6 F (37 C) (Oral)  Resp 16  Ht 5' (1.524 m)  Wt 152 lb (68.947 kg)  BMI 29.69 kg/m2  SpO2 99%  LMP 12/28/2014 Physical Exam  Constitutional: She is oriented to person, place, and time. She appears well-developed and well-nourished. No distress.  HENT:  Head: Normocephalic and atraumatic.  Right Ear: External ear normal.  Left Ear: External ear normal.  Nose: Nose normal.  Mouth/Throat: Oropharynx is clear and moist. No oropharyngeal exudate.  Eyes: Conjunctivae and EOM are normal. Pupils are equal, round, and reactive to light.  Neck: Normal range of motion. Neck supple. Carotid bruit is not present. No thyromegaly present.  Cardiovascular: Normal rate, regular rhythm, normal heart sounds and intact distal pulses.  Exam reveals no gallop and no friction rub.   No murmur heard. Pulmonary/Chest: Effort normal and breath sounds normal. She has no wheezes. She has no rales.  Abdominal: Soft. Bowel sounds are normal. She exhibits no distension and no mass. There is no tenderness. There is no rebound and no guarding.  Musculoskeletal: She exhibits no edema.  Lymphadenopathy:    She has no cervical adenopathy.  Neurological: She is alert and oriented to person, place, and time. No cranial nerve deficit.  Skin: Skin is warm and dry. No rash noted. She is not diaphoretic. No erythema. No pallor.  Psychiatric: She has a normal mood and affect. Her behavior is normal.  Nursing note and vitals reviewed.  Results for orders placed  or performed in visit on 01/12/15  POCT CBC  Result Value Ref Range   WBC 7.9 4.6 - 10.2 K/uL   Lymph, poc 3.2 0.6 - 3.4   POC LYMPH PERCENT 40.1 10 - 50 %L   MID (cbc) 0.5 0 - 0.9   POC MID % 5.8 0 - 12 %M   POC Granulocyte 4.3 2 - 6.9   Granulocyte percent 54.1 37 - 80 %G   RBC 5.45 4.04 - 5.48 M/uL   Hemoglobin 12.8 12.2 - 16.2 g/dL   HCT, POC 39.3 37.7 - 47.9 %   MCV 72.0 (A) 80 - 97 fL   MCH, POC 23.5 (A) 27 - 31.2 pg   MCHC 32.6 31.8 - 35.4 g/dL   RDW, POC 15.9 %   Platelet Count, POC 304 142 - 424 K/uL   MPV 7.7 0 - 99.8 fL  POCT urinalysis  dipstick  Result Value Ref Range   Color, UA yellow yellow   Clarity, UA clear clear   Glucose, UA negative negative   Bilirubin, UA negative negative   Ketones, POC UA negative negative   Spec Grav, UA 1.015    Blood, UA trace-lysed (A) negative   pH, UA 6.0    Protein Ur, POC negative negative   Urobilinogen, UA 0.2    Nitrite, UA Negative Negative   Leukocytes, UA Negative Negative  POCT urine pregnancy  Result Value Ref Range   Preg Test, Ur Negative Negative   [facility] reading (PRIMARY) by  Dr. Tamala Julian.  R KNEE FILMS:  Spurring lateral patella; spurring lateral joint line.      Assessment & Plan:   1. Essential hypertension, benign   2. Dizziness   3. Knee pain, right   4. Screening for HIV (human immunodeficiency virus)   5. Seasonal allergic rhinitis due to pollen      1. HTN: controlled; will decrease HCTZ to 12.5mg  daily; increase Metoprolol ER to 50mg  daily. To call in 2-4 weeks with BP readings; plan to wean HCTZ to off due to anticipated pregnancy. 2.  Dizziness: New.  Onset in past month; obtain labs; encourage hydration and aggressive treatment of allergic rhinitis. 3. Allergic Rhinitis: stable;continue Zyrtec and Flonase. 4.  R knee pain: New; onset with recent exercise program; recommend rest, icing qhs; home exercise program recommend for patellofemoral syndrome. 5.  Screening HIV: obtain HIV; pt agreeable.   6.  Family planning: continue PNV; wean off of HCTZ due to plan of pregnancy in upcoming year.   Orders Placed This Encounter  Procedures  . DG Knee Complete 4 Views Right    Standing Status: Future     Number of Occurrences: 1     Standing Expiration Date: 01/12/2016    Order Specific Question:  Reason for Exam (SYMPTOM  OR DIAGNOSIS REQUIRED)    Answer:  R>L knee pain iwth onset of exercise program; recurrent    Order Specific Question:  Is the patient pregnant?    Answer:  No    Order Specific Question:  Preferred imaging location?    Answer:  External  . Comprehensive metabolic panel  . HIV antibody  . POCT CBC  . POCT urinalysis dipstick  . POCT urine pregnancy   Meds ordered this encounter  Medications  . metoprolol succinate (TOPROL-XL) 50 MG 24 hr tablet    Sig: Take 1 tablet (50 mg total) by mouth daily.    Dispense:  90 tablet    Refill:  3  . hydrochlorothiazide (HYDRODIURIL) 12.5 MG tablet    Sig: Take 1 tablet (12.5 mg total) by mouth daily.    Dispense:  90 tablet    Refill:  1    Return in about 6 months (around 07/13/2015) for recheck high blood pressure.  By signing my name below, I, Zola Button, attest that this documentation has been prepared under the direction and in the presence of Reginia Forts, MD.  Electronically Signed: Zola Button, Medical Scribe. 01/12/2015. 6:59 PM.    I personally performed the services described in this documentation, which was scribed in my presence. The recorded information has been reviewed and considered.  Javan Gonzaga Elayne Guerin, M.D. [facility] [address] [place],   [id number] [phone] phone [phone] fax

## 2015-01-12 NOTE — Patient Instructions (Signed)
Patellofemoral Pain Syndrome  Patellofemoral pain syndrome is a condition that involves a softening or breakdown of the tissue (cartilage) on the underside of your kneecap (patella). This causes pain in the front of the knee. The condition is also called runner's knee or chondromalacia patella. Patellofemoral pain syndrome is most common in young adults who are active in sports.  Your knee is the largest joint in your body. The patella covers the front of your knee and is attached to muscles above and below your knee. The underside of the patella is covered with a smooth type of cartilage (synovium). The smooth surface helps the patella glide easily when you move your knee. Patellofemoral pain syndrome causes swelling in the joint linings and bone surfaces in your knee.   CAUSES   Patellofemoral pain syndrome can be caused by:   Overuse.   Poor alignment of your knee joints.   Weak leg muscles.   A direct blow to your kneecap.  RISK FACTORS  You may be at risk for patellofemoral pain syndrome if you:   Do a lot of activities that can wear down your kneecap. These include:    Running.    Squatting.    Climbing stairs.   Start a new physical activity or exercise program.   Wear shoes that do not fit well.   Do not have good leg strength.   Are overweight.  SIGNS AND SYMPTOMS   Knee pain is the most common symptom of patellofemoral pain syndrome. This may feel like a dull, aching pain underneath your patella, in the front of your knee. There may be a popping or cracking sound when you move your knee. Pain may get worse with:   Exercise.   Climbing stairs.   Running.   Jumping.   Squatting.   Kneeling.   Sitting for a long time.   Moving or pushing on your patella.  DIAGNOSIS   Your health care Aariya Ferrick may be able to diagnose patellofemoral pain syndrome from your symptoms and medical history. You may be asked about your recent physical activities and which ones cause knee pain. Your health care  Airlie Blumenberg may do a physical exam with certain tests to confirm the diagnosis. These may include:   Moving your patella back and forth.   Checking your range of knee motion.   Having you squat or jump to see if you have pain.   Checking the strength of your leg muscles.  An MRI of the knee may also be done.  TREATMENT   Patellofemoral pain syndrome can usually be treated at home with rest, ice, compression, and elevation (RICE). Other treatments may include:   Nonsteroidal anti-inflammatory drugs (NSAIDs).   Physical therapy to stretch and strengthen your leg muscles.   Shoe inserts (orthotics) to take stress off your knee.   A knee brace or knee support.   Surgery to remove damaged cartilage or move the patella to a better position. The need for surgery is rare.  HOME CARE INSTRUCTIONS    Take medicines only as directed by your health care Fatiha Guzy.   Rest your knee.   When resting, keep your knee raised above the level of your heart.   Avoid activities that cause knee pain.   Apply ice to the injured area:   Put ice in a plastic bag.   Place a towel between your skin and the bag.   Leave the ice on for 20 minutes, 2-3 times a day.   Use splints,   braces, knee supports, or walking aids as directed by your health care Sande Pickert.   Perform stretching and strengthening exercises as directed by your health care Jamin Panther or physical therapist.   Keep all follow-up visits as directed by your health care Shiesha Jahn. This is important.  SEEK MEDICAL CARE IF:    Your symptoms get worse.   You are not improving with home care.  MAKE SURE YOU:   Understand these instructions.   Will watch your condition.   Will get help right away if you are not doing well or get worse.     This information is not intended to replace advice given to you by your health care Gissele Narducci. Make sure you discuss any questions you have with your health care Jenkins Risdon.     Document Released: 02/28/2009 Document Revised: 04/02/2014  Document Reviewed: 06/01/2013  Elsevier Interactive Patient Education 2016 [employer]vier Inc[employer]

## 2015-01-13 LAB — HIV ANTIBODY (ROUTINE TESTING W REFLEX): HIV 1&2 Ab, 4th Generation: NONREACTIVE

## 2015-01-14 ENCOUNTER — Encounter: Payer: Self-pay | Admitting: Family Medicine

## 2015-01-27 ENCOUNTER — Ambulatory Visit (INDEPENDENT_AMBULATORY_CARE_PROVIDER_SITE_OTHER): Payer: 59 | Admitting: Emergency Medicine

## 2015-01-27 VITALS — BP 122/78 | HR 82 | Temp 98.4°F | Resp 18 | Ht 61.0 in | Wt 155.0 lb

## 2015-01-27 DIAGNOSIS — R1030 Lower abdominal pain, unspecified: Secondary | ICD-10-CM | POA: Diagnosis not present

## 2015-01-27 DIAGNOSIS — S335XXA Sprain of ligaments of lumbar spine, initial encounter: Secondary | ICD-10-CM | POA: Diagnosis not present

## 2015-01-27 DIAGNOSIS — M545 Low back pain, unspecified: Secondary | ICD-10-CM

## 2015-01-27 LAB — POCT URINALYSIS DIP (MANUAL ENTRY)
Bilirubin, UA: NEGATIVE
GLUCOSE UA: NEGATIVE
Ketones, POC UA: NEGATIVE
Leukocytes, UA: NEGATIVE
NITRITE UA: NEGATIVE
Protein Ur, POC: 30 — AB
RBC UA: NEGATIVE
Spec Grav, UA: 1.02
Urobilinogen, UA: 0.2
pH, UA: 7

## 2015-01-27 LAB — POC MICROSCOPIC URINALYSIS (UMFC): MUCUS RE: ABSENT

## 2015-01-27 LAB — POCT URINE PREGNANCY: Preg Test, Ur: NEGATIVE

## 2015-01-27 MED ORDER — CYCLOBENZAPRINE HCL 10 MG PO TABS: 10.0000 mg | ORAL_TABLET | Freq: Three times a day (TID) | ORAL | Status: DC | PRN

## 2015-01-27 MED ORDER — NAPROXEN SODIUM 550 MG PO TABS: 550.0000 mg | ORAL_TABLET | Freq: Two times a day (BID) | ORAL | Status: DC

## 2015-01-27 NOTE — Progress Notes (Signed)
Subjective:  Patient ID: Emily Downs, female    DOB: [birth year]  Age: 34 y.o. MRN: [id number]  CC: Back Pain and Abdominal Pain   HPI Emily Downs presents  patient has 2 complaints. Said that she's had lower abdominal discomfort for the last week. She has no dysuria urgency or frequency. Has no vaginal discharge or bleeding. Hasn't had intercourse since the pain started she is att had a period last week.empting to get pregnant buthad a normal period last week. She denies any increase in pain with ambulation or riding a car or driving over railroad tracks. She has no fever or chills. She had a negative home pregnancy test. She has no indigestion or heartburn. No food intolerance. She has no pain with eating no nausea vomiting or stool change   She is also complaining of pain in her right lower back that started Tuesday. She denies any injury or overuse. Denies any radiation of pain numbness tingling or weakness. She is in college and getting a master's degree in business. She also works  History Emily Downs has a past medical history of Menometrorrhagia; Iron deficiency anemia; Allergy; Genital herpes; and Hypertension.   She has no past surgical history on file.   Her  family history includes Aneurysm (age of onset: 60) in her mother; Cancer in her paternal grandmother; Crohn's disease in her sister; Diabetes in her maternal grandmother and mother; Hyperlipidemia in her father; Hypertension in her mother and sister.  She   reports that she has never smoked. She has never used smokeless tobacco. She reports that she drinks alcohol. She reports that she does not use illicit drugs.  Outpatient Prescriptions Prior to Visit  Medication Sig Dispense Refill  . cetirizine (ZYRTEC) 10 MG tablet Take 10 mg by mouth daily.    . fluticasone (FLONASE) 50 MCG/ACT nasal spray Place 2 sprays into both nostrils daily. 16 g 11  . hydrochlorothiazide (HYDRODIURIL) 12.5 MG tablet Take 1 tablet  (12.5 mg total) by mouth daily. 90 tablet 1  . metoprolol succinate (TOPROL-XL) 50 MG 24 hr tablet Take 1 tablet (50 mg total) by mouth daily. 90 tablet 3  . Prenatal Multivit-Min-Fe-FA (PRENATAL VITAMINS) 0.8 MG tablet Take 1 tablet by mouth daily. 100 tablet 3   No facility-administered medications prior to visit.    Social History   Social History  . Marital Status: Single    Spouse Name: N/A  . Number of Children: N/A  . Years of Education: N/A   Social History Main Topics  . Smoking status: Never Smoker   . Smokeless tobacco: Never Used  . Alcohol Use: Yes     Comment: social  . Drug Use: No  . Sexual Activity: Yes    Birth Control/ Protection: Pill   Other Topics Concern  . None   Social History Narrative   Marital status: married      Children: 34 yo daughter.      Lives: with husband, daughter.      Employment:  Hairdresser full time      Tobacco; none      Alcohol: rare      Drugs:  None      Exercise: none     Review of Systems  Constitutional: Negative for fever, chills and appetite change.  HENT: Negative for congestion, ear pain, postnasal drip, sinus pressure and sore throat.   Eyes: Negative for pain and redness.  Respiratory: Negative for cough, shortness of breath and wheezing.  Cardiovascular: Negative for leg swelling.  Gastrointestinal: Positive for abdominal pain. Negative for nausea, vomiting, diarrhea, constipation and blood in stool.  Endocrine: Negative for polyuria.  Genitourinary: Negative for dysuria, urgency, frequency and flank pain.  Musculoskeletal: Positive for back pain. Negative for gait problem.  Skin: Negative for rash.  Neurological: Negative for weakness and headaches.  Psychiatric/Behavioral: Negative for confusion and decreased concentration. The patient is not nervous/anxious.     Objective:  BP 122/78 mmHg  Pulse 82  Temp(Src) 98.4 F (36.9 C) (Oral)  Resp 18  Ht 5\' 1"  (1.549 m)  Wt 155 lb (70.308 kg)  BMI 29.30  kg/m2  SpO2 99%  LMP 01/20/2015 (Exact Date)  Physical Exam  Constitutional: She is oriented to person, place, and time. She appears well-developed and well-nourished. No distress.  HENT:  Head: Normocephalic and atraumatic.  Right Ear: External ear normal.  Left Ear: External ear normal.  Nose: Nose normal.  Eyes: Conjunctivae and EOM are normal. Pupils are equal, round, and reactive to light. No scleral icterus.  Neck: Normal range of motion. Neck supple. No tracheal deviation present.  Cardiovascular: Normal rate, regular rhythm and normal heart sounds.   Pulmonary/Chest: Effort normal. No respiratory distress. She has no wheezes. She has no rales.  Abdominal: She exhibits no mass. There is tenderness (She has a questionable Murphy sign other than that her abdomen is soft and nontender there is certainly no rebound rigidity or guarding.). There is no rebound and no guarding.  Musculoskeletal: She exhibits no edema.       Lumbar back: She exhibits tenderness (In the right low back.).  Lymphadenopathy:    She has no cervical adenopathy.  Neurological: She is alert and oriented to person, place, and time. Coordination normal.  Skin: Skin is warm and dry. No rash noted.  Psychiatric: She has a normal mood and affect. Her behavior is normal.      Assessment & Plan:   Emily Downs was seen today for back pain and abdominal pain.  Diagnoses and all orders for this visit:  Bilateral low back pain without sciatica -     POCT urine pregnancy -     POCT urinalysis dipstick -     POCT Microscopic Urinalysis ([facility])  Sprain of lumbar region, initial encounter  Lower abdominal pain -     US Pelvis Complete; Future  Other orders -     naproxen sodium (ANAPROX DS) 550 MG tablet; Take 1 tablet (550 mg total) by mouth 2 (two) times daily with a meal. -     cyclobenzaprine (FLEXERIL) 10 MG tablet; Take 1 tablet (10 mg total) by mouth 3 (three) times daily as needed for muscle spasms.   I am  having Emily Downs start on naproxen sodium and cyclobenzaprine. I am also having her maintain her cetirizine, fluticasone, Prenatal Vitamins, metoprolol succinate, and hydrochlorothiazide.  Meds ordered this encounter  Medications  . naproxen sodium (ANAPROX DS) 550 MG tablet    Sig: Take 1 tablet (550 mg total) by mouth 2 (two) times daily with a meal.    Dispense:  40 tablet    Refill:  0  . cyclobenzaprine (FLEXERIL) 10 MG tablet    Sig: Take 1 tablet (10 mg total) by mouth 3 (three) times daily as needed for muscle spasms.    Dispense:  30 tablet    Refill:  0    she was offered a STD URiprobe and refused. As she is in a monogamous relationship . She  was scheduled for outpatient ultrasound of her abdomen pelvis  Appropriate red flag conditions were discussed with the patient as well as actions that should be taken.  Patient expressed his understanding.  Follow-up: Return if symptoms worsen or fail to improve.  Roselee Culver, MD   Results for orders placed or performed in visit on 01/27/15  POCT urine pregnancy  Result Value Ref Range   Preg Test, Ur Negative Negative  POCT urinalysis dipstick  Result Value Ref Range   Color, UA yellow yellow   Clarity, UA clear clear   Glucose, UA negative negative   Bilirubin, UA negative negative   Ketones, POC UA negative negative   Spec Grav, UA 1.020    Blood, UA negative negative   pH, UA 7.0    Protein Ur, POC =30 (A) negative   Urobilinogen, UA 0.2    Nitrite, UA Negative Negative   Leukocytes, UA Negative Negative  POCT Microscopic Urinalysis ([facility])  Result Value Ref Range   WBC,UR,HPF,POC None None WBC/hpf   RBC,UR,HPF,POC None None RBC/hpf   Bacteria None None, Too numerous to count   Mucus Absent Absent   Epithelial Cells, UR Per Microscopy None None, Too numerous to count cells/hpf

## 2015-01-27 NOTE — Patient Instructions (Signed)

## 2015-01-29 ENCOUNTER — Other Ambulatory Visit: Payer: Self-pay | Admitting: Emergency Medicine

## 2015-02-01 ENCOUNTER — Other Ambulatory Visit: Payer: Self-pay

## 2015-02-01 DIAGNOSIS — R1030 Lower abdominal pain, unspecified: Secondary | ICD-10-CM

## 2015-02-23 ENCOUNTER — Ambulatory Visit (INDEPENDENT_AMBULATORY_CARE_PROVIDER_SITE_OTHER): Payer: 59 | Admitting: Physician Assistant

## 2015-02-23 VITALS — BP 120/78 | HR 83 | Temp 98.0°F | Resp 16 | Ht 61.0 in | Wt 155.0 lb

## 2015-02-23 DIAGNOSIS — A499 Bacterial infection, unspecified: Secondary | ICD-10-CM | POA: Diagnosis not present

## 2015-02-23 DIAGNOSIS — B9689 Other specified bacterial agents as the cause of diseases classified elsewhere: Secondary | ICD-10-CM

## 2015-02-23 DIAGNOSIS — N760 Acute vaginitis: Secondary | ICD-10-CM | POA: Diagnosis not present

## 2015-02-23 DIAGNOSIS — N898 Other specified noninflammatory disorders of vagina: Secondary | ICD-10-CM

## 2015-02-23 DIAGNOSIS — N9489 Other specified conditions associated with female genital organs and menstrual cycle: Secondary | ICD-10-CM

## 2015-02-23 LAB — POCT WET + KOH PREP
TRICH BY WET PREP: ABSENT
YEAST BY WET PREP: ABSENT
Yeast by KOH: ABSENT

## 2015-02-23 MED ORDER — METRONIDAZOLE 500 MG PO TABS: 500.0000 mg | ORAL_TABLET | Freq: Two times a day (BID) | ORAL | Status: DC

## 2015-02-23 NOTE — Progress Notes (Signed)
[facility] [address], [place] [address] [phone]  Date:  02/23/2015   Name:  Emily Downs   DOB:  Feb 19, 1981   MRN:  [id number]  PCP:  Reginia Forts, MD   Chief Complaint  Patient presents with  . Vaginal odor    Onset 3-4 weeks    History of Present Illness:  Emily Downs is a 34 y.o. female patient who presents to [facility] for 3-4 weeks of vaginal discharge that is of an amine odor.  She has no vaginal pruritus or rash.  She has no urinary sxs of frequency, hematuria, or dysuria.  She is trying to conceive at this time, and no longer using any contraception--as of 3 months.     Patient's last menstrual period was 02/14/2015.  Patient Active Problem List   Diagnosis Date Noted  . Essential hypertension, benign 12/11/2013  . Menometrorrhagia   . Iron deficiency anemia     Past Medical History  Diagnosis Date  . Menometrorrhagia   . Iron deficiency anemia   . Allergy   . Genital herpes   . Hypertension     No past surgical history on file.  Social History  Substance Use Topics  . Smoking status: Never Smoker   . Smokeless tobacco: Never Used  . Alcohol Use: Yes     Comment: social    Family History  Problem Relation Age of Onset  . Hypertension Mother   . Diabetes Mother   . Aneurysm Mother 35    brain aneurysm  . Hyperlipidemia Father   . Diabetes Maternal Grandmother   . Cancer Paternal Grandmother   . Crohn's disease Sister   . Hypertension Sister     No Known Allergies  Medication list has been reviewed and updated.  Current Outpatient Prescriptions on File Prior to Visit  Medication Sig Dispense Refill  . cetirizine (ZYRTEC) 10 MG tablet Take 10 mg by mouth daily.    . fluticasone (FLONASE) 50 MCG/ACT nasal spray Place 2 sprays into both nostrils daily. 16 g 11  . hydrochlorothiazide (HYDRODIURIL) 12.5 MG tablet Take 1 tablet (12.5 mg total) by mouth daily. 90 tablet 1  . metoprolol succinate (TOPROL-XL)  50 MG 24 hr tablet Take 1 tablet (50 mg total) by mouth daily. 90 tablet 3  . Prenatal Multivit-Min-Fe-FA (PRENATAL VITAMINS) 0.8 MG tablet Take 1 tablet by mouth daily. 100 tablet 3   No current facility-administered medications on file prior to visit.    ROS ROS otherwise unremarkable unless listed above.   Physical Examination: BP 120/78 mmHg  Pulse 83  Temp(Src) 98 F (36.7 C) (Oral)  Resp 16  Ht 5\' 1"  (1.549 m)  Wt 155 lb (70.308 kg)  BMI 29.30 kg/m2  SpO2 98%  LMP 02/14/2015 Ideal Body Weight: Weight in (lb) to have BMI = 25: 132  Physical Exam  Constitutional: She is oriented to person, place, and time. She appears well-developed and well-nourished. No distress.  HENT:  Head: Normocephalic and atraumatic.  Right Ear: External ear normal.  Left Ear: External ear normal.  Eyes: Conjunctivae and EOM are normal. Pupils are equal, round, and reactive to light.  Cardiovascular: Normal rate.   Pulmonary/Chest: Effort normal. No respiratory distress.  Abdominal: Soft.  Genitourinary: Pelvic exam was performed with patient supine. There is no rash or tenderness on the right labia. There is no rash or tenderness on the left labia. Cervix exhibits no motion tenderness, no discharge (white) and no friability.  Right adnexum displays no mass and no tenderness. Left adnexum displays no mass and no tenderness. No erythema in the vagina.    Neurological: She is alert and oriented to person, place, and time.  Skin: Skin is warm and dry. She is not diaphoretic.  Psychiatric: She has a normal mood and affect. Her behavior is normal.    Results for orders placed or performed in visit on 02/23/15  POCT Wet + KOH Prep  Result Value Ref Range   Yeast by KOH Absent Present, Absent   Yeast by wet prep Absent Present, Absent   WBC by wet prep Many (A) None, Few, Too numerous to count   Clue Cells Wet Prep HPF POC Few (A) None, Too numerous to count   Trich by wet prep Absent Present,  Absent   Bacteria Wet Prep HPF POC Many (A) None, Few, Too numerous to count   Epithelial Cells By [employer] Pref ([facility]) Moderate (A) None, Few, Too numerous to count   RBC,UR,HPF,POC None None RBC/hpf     Assessment and Plan: Emily Downs is a 34 y.o. female who is here today who is here today with cc of vaginal odor.  Treating for BV at this time, given hx of symptoms and length of symptoms.  I have advised her to start a lactobacillus probiotic supplement.  bv risk factors discussed.  Bacterial vaginosis - Plan: metroNIDAZOLE (FLAGYL) 500 MG tablet  Vaginal odor - Plan: POCT Wet + KOH Prep, metroNIDAZOLE (FLAGYL) 500 MG tablet    Ivar Drape, PA-C [facility] 02/23/2015 8:41 AM

## 2015-02-23 NOTE — Patient Instructions (Signed)

## 2015-05-05 ENCOUNTER — Ambulatory Visit (INDEPENDENT_AMBULATORY_CARE_PROVIDER_SITE_OTHER): Payer: 59 | Admitting: Family Medicine

## 2015-05-05 VITALS — BP 124/92 | HR 90 | Temp 98.0°F | Resp 16 | Ht 60.5 in | Wt 157.0 lb

## 2015-05-05 DIAGNOSIS — R0981 Nasal congestion: Secondary | ICD-10-CM | POA: Diagnosis not present

## 2015-05-05 DIAGNOSIS — J392 Other diseases of pharynx: Secondary | ICD-10-CM | POA: Diagnosis not present

## 2015-05-05 DIAGNOSIS — R059 Cough, unspecified: Secondary | ICD-10-CM

## 2015-05-05 DIAGNOSIS — R05 Cough: Secondary | ICD-10-CM | POA: Diagnosis not present

## 2015-05-05 DIAGNOSIS — J3489 Other specified disorders of nose and nasal sinuses: Secondary | ICD-10-CM | POA: Diagnosis not present

## 2015-05-05 LAB — POCT INFLUENZA A/B
Influenza A, POC: NEGATIVE
Influenza B, POC: NEGATIVE

## 2015-05-05 LAB — POC MICROSCOPIC URINALYSIS (UMFC): Mucus: ABSENT

## 2015-05-05 MED ORDER — AMOXICILLIN-POT CLAVULANATE 875-125 MG PO TABS: 1.0000 | ORAL_TABLET | Freq: Two times a day (BID) | ORAL | Status: DC

## 2015-05-05 NOTE — Patient Instructions (Signed)

## 2015-05-05 NOTE — Progress Notes (Addendum)
By signing my name below, I, Rawaa Al Rifaie, attest that this documentation has been prepared under the direction and in the presence of Robyn Haber, [place], Medical Scribe. 05/05/2015.  10:11 AM.  Patient ID: Emily Downs MRN: [id number], DOB: April 17, 1980, 35 y.o. Date of Encounter: 05/05/2015  Primary Physician: Reginia Forts, MD  Chief Complaint:  Chief Complaint  Patient presents with   Generalized Body Aches    since yesterday   Fever   Nasal Congestion    x 2 days   Sore Throat    HPI:  Emily Downs is a 34 y.o. female who presents to [facility] complaining of flu-like symptoms, gradual onset 4 days ago.  Pt reports that her symptoms started with a sore throat, that then developed into having sneezes, cough that has improved today, rhinorrhea, congestion, mild ear ache. Pt notes that her symptoms worsened yesterday when she was presented with fever, and myalgia.  Pt works as a Probation officer.    Past Medical History  Diagnosis Date   Menometrorrhagia    Iron deficiency anemia    Allergy    Genital herpes    Hypertension      Home Meds: Prior to Admission medications   Medication Sig Start Date End Date Taking? Authorizing Provider  cetirizine (ZYRTEC) 10 MG tablet Take 10 mg by mouth daily.   Yes Historical Provider, MD  fluticasone (FLONASE) 50 MCG/ACT nasal spray Place 2 sprays into both nostrils daily. 07/05/14  Yes Wardell Honour, MD  hydrochlorothiazide (HYDRODIURIL) 12.5 MG tablet Take 1 tablet (12.5 mg total) by mouth daily. 01/12/15  Yes Wardell Honour, MD  metoprolol succinate (TOPROL-XL) 50 MG 24 hr tablet Take 1 tablet (50 mg total) by mouth daily. 01/12/15  Yes Wardell Honour, MD  Prenatal Multivit-Min-Fe-FA (PRENATAL VITAMINS) 0.8 MG tablet Take 1 tablet by mouth daily. 07/05/14  Yes Wardell Honour, MD    Allergies: No Known Allergies  Social History   Social History   Marital Status: Single      Spouse Name: N/A   Number of Children: N/A   Years of Education: N/A   Occupational History   Not on file.   Social History Main Topics   Smoking status: Never Smoker    Smokeless tobacco: Never Used   Alcohol Use: Yes     Comment: social   Drug Use: No   Sexual Activity: Yes    Birth Control/ Protection: Pill   Other Topics Concern   Not on file   Social History Narrative   Marital status: married      Children: 79 yo daughter.      Lives: with husband, daughter.      Employment:  Hairdresser full time      Tobacco; none      Alcohol: rare      Drugs:  None      Exercise: none     Review of Systems: Constitutional: negative for chills, night sweats, weight changes, or fatigue. Positive for fever.  HEENT: negative for vision changes, hearing loss, ST, epistaxis, or sinus pressure. Positive for sneezing, rhinorrhea, congestion, ear ache. Cardiovascular: negative for chest pain or palpitations Respiratory: negative for hemoptysis, wheezing, shortness of breath. Positive for cough. Abdominal: negative for abdominal pain, nausea, vomiting, diarrhea, or constipation Dermatological: negative for rash Neurologic: negative for headache, dizziness, or syncope Msk: Positive for myalgia. All other systems reviewed and are otherwise negative with the  exception to those above and in the HPI.  Physical Exam: Blood pressure 124/92, pulse 90, temperature 98 F (36.7 C), resp. rate 16, height 5' 0.5" (1.537 m), weight 157 lb (71.215 kg), last menstrual period 05/05/2015, SpO2 98 %., Body mass index is 30.15 kg/(m^2). General: Well developed, well nourished, in no acute distress. Head: Normocephalic, atraumatic, eyes without discharge, sclera non-icteric, nares are without discharge.Oral cavity moist, posterior pharynx without exudate, erythema, peritonsillar abscess, or post nasal drip. Thick nasal discharge. Both ears have mild bulging of the TM's and clear fluid.  Neck:  Supple. No thyromegaly. Full ROM. No lymphadenopathy. Lungs: Clear bilaterally to auscultation without wheezes, rales, or rhonchi. Breathing is unlabored. Heart: RRR with S1 S2. No murmurs, rubs, or gallops appreciated. Msk:  Strength and tone normal for age. Extremities/Skin: Warm and dry. No clubbing or cyanosis. No edema. No rashes or suspicious lesions. Neuro: Alert and oriented X 3. Moves all extremities spontaneously. Gait is normal. CNII-XII grossly in tact. Psych:  Responds to questions appropriately with a normal affect.    Labs:  ASSESSMENT AND PLAN:  35 y.o. year old female with   This chart was scribed in my presence and reviewed by me personally.    ICD-9-CM ICD-10-CM   1. Rhinorrhea 478.19 J34.89 POCT Influenza A/B  2. Fever in newborn 778.4 P81.8 POCT Influenza A/B  3. Cough 786.2 R05 POCT Influenza A/B  4. Throat irritation 478.29 J39.2 POCT Influenza A/B  5. Sinus congestion 478.19 R09.81 amoxicillin-clavulanate (AUGMENTIN) 875-125 MG tablet     Signed, Robyn Haber, MD 05/05/2015 10:06 AM

## 2015-05-19 ENCOUNTER — Encounter: Payer: Self-pay | Admitting: Family Medicine

## 2015-05-20 ENCOUNTER — Ambulatory Visit (INDEPENDENT_AMBULATORY_CARE_PROVIDER_SITE_OTHER): Payer: 59 | Admitting: Family Medicine

## 2015-05-20 VITALS — BP 114/74 | HR 82 | Temp 98.7°F | Resp 16 | Ht 61.0 in | Wt 162.0 lb

## 2015-05-20 DIAGNOSIS — R300 Dysuria: Secondary | ICD-10-CM

## 2015-05-20 LAB — POCT URINALYSIS DIP (MANUAL ENTRY)
Bilirubin, UA: NEGATIVE
Glucose, UA: NEGATIVE
Ketones, POC UA: NEGATIVE
Nitrite, UA: POSITIVE — AB
Protein Ur, POC: NEGATIVE
Spec Grav, UA: <=1.005
Urobilinogen, UA: 0.2
pH, UA: 6

## 2015-05-20 LAB — POCT URINE PREGNANCY: Preg Test, Ur: NEGATIVE

## 2015-05-20 MED ORDER — NITROFURANTOIN MONOHYD MACRO 100 MG PO CAPS: 100.0000 mg | ORAL_CAPSULE | Freq: Two times a day (BID) | ORAL | Status: DC

## 2015-05-20 NOTE — Progress Notes (Signed)
@UMFCLOGO @  By signing my name below, I, Raven Small, attest that this documentation has been prepared under the direction and in the presence of Robyn Haber, MD.  Electronically Signed: Thea Alken, ED Scribe. 05/20/2015. 9:59 AM.  Patient ID: Emily Downs MRN: [id number], DOB: 27-Mar-1980, 35 y.o. Date of Encounter: 05/20/2015, 9:58 AM  Primary Physician: Reginia Forts, MD  Chief Complaint:  Chief Complaint  Patient presents with  . Urinary Frequency    last night  . Dysuria    HPI: 35 y.o. year old female with history below presents with sudden dysuria and urinary frequency that began last night. She took Azo last night. Pt is trying to conceive a child and is currently taking pre natal vitamins. LMP-2/8. She denies back pain.    Past Medical History  Diagnosis Date  . Menometrorrhagia   . Iron deficiency anemia   . Allergy   . Genital herpes   . Hypertension      Home Meds: Prior to Admission medications   Medication Sig Start Date End Date Taking? Authorizing Provider  cetirizine (ZYRTEC) 10 MG tablet Take 10 mg by mouth daily.   Yes Historical Provider, MD  fluticasone (FLONASE) 50 MCG/ACT nasal spray Place 2 sprays into both nostrils daily. 07/05/14  Yes Wardell Honour, MD  hydrochlorothiazide (HYDRODIURIL) 12.5 MG tablet Take 1 tablet (12.5 mg total) by mouth daily. 01/12/15  Yes Wardell Honour, MD  metoprolol succinate (TOPROL-XL) 50 MG 24 hr tablet Take 1 tablet (50 mg total) by mouth daily. 01/12/15  Yes Wardell Honour, MD  Prenatal Multivit-Min-Fe-FA (PRENATAL VITAMINS) 0.8 MG tablet Take 1 tablet by mouth daily. 07/05/14  Yes Wardell Honour, MD    Allergies: No Known Allergies  Social History   Social History  . Marital Status: Single    Spouse Name: N/A  . Number of Children: N/A  . Years of Education: N/A   Occupational History  . Not on file.   Social History Main Topics  . Smoking status: Never Smoker   . Smokeless tobacco: Never Used  .  Alcohol Use: Yes     Comment: social  . Drug Use: No  . Sexual Activity: Yes    Birth Control/ Protection: Pill   Other Topics Concern  . Not on file   Social History Narrative   Marital status: married      Children: 48 yo daughter.      Lives: with husband, daughter.      Employment:  Hairdresser full time      Tobacco; none      Alcohol: rare      Drugs:  None      Exercise: none    Review of Systems: Constitutional: negative for chills, fever, night sweats, weight changes, or fatigue  HEENT: negative for vision changes, hearing loss, congestion, rhinorrhea, ST, epistaxis, or sinus pressure Cardiovascular: negative for chest pain or palpitations Respiratory: negative for hemoptysis, wheezing, shortness of breath, or cough Abdominal: negative for abdominal pain, nausea, vomiting, diarrhea, or constipation Dermatological: negative for rash Neurologic: negative for headache, dizziness, or syncope All other systems reviewed and are otherwise negative with the exception to those above and in the HPI.   Physical Exam: Blood pressure 114/74, pulse 82, temperature 98.7 F (37.1 C), resp. rate 16, height 5\' 1"  (1.549 m), weight 162 lb (73.483 kg), last menstrual period 05/05/2015., Body mass index is 30.63 kg/(m^2). General: Well developed, well nourished, in no acute distress. Head: Normocephalic, atraumatic, eyes without  discharge, sclera non-icteric, nares are without discharge. Bilateral auditory canals clear, TM's are without perforation, pearly grey and translucent with reflective cone of light bilaterally. Oral cavity moist, posterior pharynx without exudate, erythema, peritonsillar abscess, or post nasal drip.  Neck: Supple. No thyromegaly. Full ROM. No lymphadenopathy. Lungs: Clear bilaterally to auscultation without wheezes, rales, or rhonchi. Breathing is unlabored. Heart: RRR with S1 S2. No murmurs, rubs, or gallops appreciated. Abdomen: Soft, non-tender, non-distended with  normoactive bowel sounds. No hepatomegaly. No rebound/guarding. No obvious abdominal masses. Msk:  Strength and tone normal for age. No CVA tenderness. Extremities/Skin: Warm and dry. No clubbing or cyanosis. No edema. No rashes or suspicious lesions. Neuro: Alert and oriented X 3. Moves all extremities spontaneously. Gait is normal. CNII-XII grossly in tact. Psych:  Responds to questions appropriately with a normal affect.   Labs: Results for orders placed or performed in visit on 05/20/15  POCT urinalysis dipstick  Result Value Ref Range   Color, UA yellow yellow   Clarity, UA clear clear   Glucose, UA negative negative   Bilirubin, UA negative negative   Ketones, POC UA negative negative   Spec Grav, UA <=1.005    Blood, UA trace-lysed (A) negative   pH, UA 6.0    Protein Ur, POC negative negative   Urobilinogen, UA 0.2    Nitrite, UA Positive (A) Negative   Leukocytes, UA small (1+) (A) Negative  POCT urine pregnancy  Result Value Ref Range   Preg Test, Ur Negative Negative      ASSESSMENT AND PLAN:  35 y.o. year old female with  This chart was scribed in my presence and reviewed by me personally.    ICD-9-CM ICD-10-CM   1. Dysuria 788.1 R30.0 POCT urinalysis dipstick     POCT Microscopic Urinalysis ([facility])     POCT urine pregnancy     nitrofurantoin, macrocrystal-monohydrate, (MACROBID) 100 MG capsule    Signed, Robyn Haber, MD 05/20/2015 9:58 AM

## 2015-05-20 NOTE — Patient Instructions (Signed)

## 2015-07-04 ENCOUNTER — Encounter: Payer: Self-pay | Admitting: Family Medicine

## 2015-07-05 ENCOUNTER — Other Ambulatory Visit: Payer: Self-pay

## 2015-07-05 MED ORDER — METOPROLOL SUCCINATE ER 50 MG PO TB24: 50.0000 mg | ORAL_TABLET | Freq: Every day | ORAL | Status: DC

## 2015-07-05 MED ORDER — HYDROCHLOROTHIAZIDE 12.5 MG PO TABS: 12.5000 mg | ORAL_TABLET | Freq: Every day | ORAL | Status: DC

## 2015-07-06 ENCOUNTER — Encounter: Payer: Self-pay | Admitting: Family Medicine

## 2015-07-06 ENCOUNTER — Telehealth: Payer: Self-pay

## 2015-07-06 NOTE — Telephone Encounter (Signed)
Msg is for Dr. Tamala Julian, pt was trying to schedule an appt with her and there are no available slots. She refuses to be seen in the walk-in center.  Please advise  [phone]

## 2015-07-07 ENCOUNTER — Other Ambulatory Visit: Payer: Self-pay

## 2015-07-07 MED ORDER — METOPROLOL SUCCINATE ER 25 MG PO TB24: 25.0000 mg | ORAL_TABLET | Freq: Every day | ORAL | Status: DC

## 2015-07-07 NOTE — Telephone Encounter (Signed)
If patient refuses to see me at the Walk in clinic, I would recommend her call on Monday to see if I have any cancellations for Tuesdays (which frequently happens after patients receive reminder call of appointment on Fridays).  Please explain to her that we have four doctors retiring in the next six months and we are in the process of hiring new doctors; however, I am working less at the appointment center temporarily during this transition.

## 2015-07-08 NOTE — Telephone Encounter (Signed)
If there is a cancellation can we call pt?

## 2015-07-11 NOTE — Telephone Encounter (Signed)
Will add him to Dr. Tamala Julian wait list, so if any cancellation he will be called for future times.

## 2015-07-26 ENCOUNTER — Ambulatory Visit (INDEPENDENT_AMBULATORY_CARE_PROVIDER_SITE_OTHER): Payer: 59 | Admitting: Family Medicine

## 2015-07-26 ENCOUNTER — Encounter: Payer: Self-pay | Admitting: Family Medicine

## 2015-07-26 VITALS — BP 126/84 | HR 83 | Temp 98.4°F | Resp 16 | Ht 61.0 in | Wt 160.4 lb

## 2015-07-26 DIAGNOSIS — R002 Palpitations: Secondary | ICD-10-CM | POA: Diagnosis not present

## 2015-07-26 DIAGNOSIS — N921 Excessive and frequent menstruation with irregular cycle: Secondary | ICD-10-CM

## 2015-07-26 DIAGNOSIS — J301 Allergic rhinitis due to pollen: Secondary | ICD-10-CM

## 2015-07-26 DIAGNOSIS — D509 Iron deficiency anemia, unspecified: Secondary | ICD-10-CM

## 2015-07-26 DIAGNOSIS — I10 Essential (primary) hypertension: Secondary | ICD-10-CM

## 2015-07-26 LAB — LIPID PANEL
CHOL/HDL RATIO: 2.6 ratio (ref <=5.0)
CHOLESTEROL: 196 mg/dL (ref 125–200)
HDL: 75 mg/dL (ref >=46)
LDL Cholesterol: 97 mg/dL (ref <130)
Triglycerides: 122 mg/dL (ref <150)
VLDL: 24 mg/dL (ref <30)

## 2015-07-26 LAB — CBC WITH DIFFERENTIAL/PLATELET
BASOS ABS: 0 {cells}/uL (ref 0–200)
Basophils Relative: 0 %
EOS ABS: 122 {cells}/uL (ref 15–500)
Eosinophils Relative: 2 %
HCT: 40 % (ref 35.0–45.0)
Hemoglobin: 13 g/dL (ref 11.7–15.5)
LYMPHS PCT: 41 %
Lymphs Abs: 2501 cells/uL (ref 850–3900)
MCH: 23.9 pg — AB (ref 27.0–33.0)
MCHC: 32.5 g/dL (ref 32.0–36.0)
MCV: 73.7 fL — AB (ref 80.0–100.0)
MONOS PCT: 6 %
MPV: 10 fL (ref 7.5–12.5)
Monocytes Absolute: 366 cells/uL (ref 200–950)
NEUTROS ABS: 3111 {cells}/uL (ref 1500–7800)
Neutrophils Relative %: 51 %
PLATELETS: 376 10*3/uL (ref 140–400)
RBC: 5.43 MIL/uL — ABNORMAL HIGH (ref 3.80–5.10)
RDW: 16 % — ABNORMAL HIGH (ref 11.0–15.0)
WBC: 6.1 10*3/uL (ref 3.8–10.8)

## 2015-07-26 LAB — COMPREHENSIVE METABOLIC PANEL
ALT: 29 U/L (ref 6–29)
AST: 32 U/L — ABNORMAL HIGH (ref 10–30)
Albumin: 3.8 g/dL (ref 3.6–5.1)
Alkaline Phosphatase: 68 U/L (ref 33–115)
BUN: 17 mg/dL (ref 7–25)
CHLORIDE: 100 mmol/L (ref 98–110)
CO2: 26 mmol/L (ref 20–31)
Calcium: 9.8 mg/dL (ref 8.6–10.2)
Creat: 0.92 mg/dL (ref 0.50–1.10)
Glucose, Bld: 70 mg/dL (ref 65–99)
POTASSIUM: 3.7 mmol/L (ref 3.5–5.3)
Sodium: 140 mmol/L (ref 135–146)
TOTAL PROTEIN: 7.2 g/dL (ref 6.1–8.1)
Total Bilirubin: 0.4 mg/dL (ref 0.2–1.2)

## 2015-07-26 LAB — TSH: TSH: 1.36 mIU/L

## 2015-07-26 MED ORDER — HYDROCHLOROTHIAZIDE 12.5 MG PO TABS: 12.5000 mg | ORAL_TABLET | Freq: Every day | ORAL | Status: DC

## 2015-07-26 MED ORDER — METOPROLOL SUCCINATE ER 25 MG PO TB24: 25.0000 mg | ORAL_TABLET | Freq: Every day | ORAL | Status: DC

## 2015-07-26 MED ORDER — FLUTICASONE PROPIONATE 50 MCG/ACT NA SUSP: 2.0000 | Freq: Every day | NASAL | Status: DC

## 2015-07-26 NOTE — Patient Instructions (Signed)
     IF you received an x-ray today, you will receive an invoice from [facility]. Please contact [facility] at [phone] with questions or concerns regarding your invoice.   IF you received labwork today, you will receive an invoice from [facility]. Please contact [facility] at [phone] with questions or concerns regarding your invoice.   Our billing staff will not be able to assist you with questions regarding bills from these companies.  You will be contacted with the lab results as soon as they are available. The fastest way to get your results is to activate your My Chart account. Instructions are located on the last page of this paperwork. If you have not heard from us regarding the results in 2 weeks, please contact this office.      

## 2015-07-26 NOTE — Progress Notes (Signed)
Subjective:    Patient ID: Emily Downs, female    DOB: 04/14/1980, 35 y.o.   MRN: [id number]  07/26/2015  Follow-up   HPI This 35 y.o. female presents for six month follow-up:  1. HTN: Patient reports good compliance with medication, good tolerance to medication, and good symptom control.  Decreased HTCZ 12.5mg  daily.  104/78, 012/77.  Taking Metoprolol ER 25mg  daily.   Brother in Sports coach hung himself on Sunday.    2.  Depression: has been trying to conceive since end of June; has been almost one year.  Comfort eating due to sadness.  Started yesterday giving up bread, potatoes, sweets.  Menses regular.  Monthly schedule; not exactly 28 days; usually 26-28 days.  Gynecologist [facility].  Taking PNV. Has not called OB/GYN.   Review of Systems  Constitutional: Negative for fever, chills, diaphoresis and fatigue.  Eyes: Negative for visual disturbance.  Respiratory: Negative for cough and shortness of breath.   Cardiovascular: Negative for chest pain, palpitations and leg swelling.  Gastrointestinal: Negative for nausea, vomiting, abdominal pain, diarrhea and constipation.  Endocrine: Negative for cold intolerance, heat intolerance, polydipsia, polyphagia and polyuria.  Neurological: Negative for dizziness, tremors, seizures, syncope, facial asymmetry, speech difficulty, weakness, light-headedness, numbness and headaches.    Past Medical History  Diagnosis Date  . Menometrorrhagia   . Iron deficiency anemia   . Allergy   . Genital herpes   . Hypertension    History reviewed. No pertinent past surgical history. No Known Allergies  Social History   Social History  . Marital Status: Single    Spouse Name: N/A  . Number of Children: N/A  . Years of Education: N/A   Occupational History  . Not on file.   Social History Main Topics  . Smoking status: Never Smoker   . Smokeless tobacco: Never Used  . Alcohol Use: Yes     Comment: social  . Drug Use: No  . Sexual  Activity: Yes    Birth Control/ Protection: Pill   Other Topics Concern  . Not on file   Social History Narrative   Marital status: married      Children: 60 yo daughter.      Lives: with husband, daughter.      Employment:  Hairdresser full time      Tobacco; none      Alcohol: rare      Drugs:  None      Exercise: none   Family History  Problem Relation Age of Onset  . Hypertension Mother   . Diabetes Mother   . Aneurysm Mother 48    brain aneurysm  . Hyperlipidemia Father   . Diabetes Maternal Grandmother   . Cancer Paternal Grandmother   . Crohn's disease Sister   . Hypertension Sister        Objective:    BP 126/84 mmHg  Pulse 83  Temp(Src) 98.4 F (36.9 C) (Oral)  Resp 16  Ht 5\' 1"  (1.549 m)  Wt 160 lb 6.4 oz (72.757 kg)  BMI 30.32 kg/m2  SpO2 99%  LMP 07/20/2015 Physical Exam  Constitutional: She is oriented to person, place, and time. She appears well-developed and well-nourished. No distress.  HENT:  Head: Normocephalic and atraumatic.  Right Ear: External ear normal.  Left Ear: External ear normal.  Nose: Nose normal.  Mouth/Throat: Oropharynx is clear and moist.  Eyes: Conjunctivae and EOM are normal. Pupils are equal, round, and reactive to light.  Neck: Normal range  of motion. Neck supple. Carotid bruit is not present. No thyromegaly present.  Cardiovascular: Normal rate, regular rhythm, normal heart sounds and intact distal pulses.  Exam reveals no gallop and no friction rub.   No murmur heard. Pulmonary/Chest: Effort normal and breath sounds normal. She has no wheezes. She has no rales.  Abdominal: Soft. Bowel sounds are normal. She exhibits no distension and no mass. There is no tenderness. There is no rebound and no guarding.  Lymphadenopathy:    She has no cervical adenopathy.  Neurological: She is alert and oriented to person, place, and time. No cranial nerve deficit.  Skin: Skin is warm and dry. No rash noted. She is not diaphoretic. No  erythema. No pallor.  Psychiatric: She has a normal mood and affect. Her behavior is normal.        Assessment & Plan:   1. Essential hypertension, benign   2. Iron deficiency anemia   3. Menometrorrhagia   4. Palpitations   5. Seasonal allergic rhinitis due to pollen     Orders Placed This Encounter  Procedures  . CBC with Differential/Platelet  . Comprehensive metabolic panel    Order Specific Question:  Has the patient fasted?    Answer:  Yes  . Lipid panel    Order Specific Question:  Has the patient fasted?    Answer:  Yes  . TSH  . EKG 12-Lead   Meds ordered this encounter  Medications  . metoprolol succinate (TOPROL-XL) 25 MG 24 hr tablet    Sig: Take 1 tablet (25 mg total) by mouth daily.    Dispense:  90 tablet    Refill:  1  . hydrochlorothiazide (HYDRODIURIL) 12.5 MG tablet    Sig: Take 1 tablet (12.5 mg total) by mouth daily.    Dispense:  90 tablet    Refill:  1  . fluticasone (FLONASE) 50 MCG/ACT nasal spray    Sig: Place 2 sprays into both nostrils daily.    Dispense:  16 g    Refill:  11    Return in about 6 months (around 01/26/2016) for recheck high blood pressure.    Alvar Malinoski Elayne Guerin, M.D. [facility] [address] [place], [place]  [address] [phone] phone [phone] fax

## 2015-08-17 ENCOUNTER — Encounter: Payer: Self-pay | Admitting: Family Medicine

## 2015-08-19 ENCOUNTER — Other Ambulatory Visit (HOSPITAL_COMMUNITY): Payer: Self-pay | Admitting: Obstetrics and Gynecology

## 2015-08-19 ENCOUNTER — Encounter: Payer: Self-pay | Admitting: Family Medicine

## 2015-08-19 DIAGNOSIS — N970 Female infertility associated with anovulation: Secondary | ICD-10-CM

## 2015-08-24 ENCOUNTER — Ambulatory Visit (HOSPITAL_COMMUNITY)
Admission: RE | Admit: 2015-08-24 | Discharge: 2015-08-24 | Disposition: A | Discharge status: Home / Self Care | Payer: 59 | Source: Ambulatory Visit | Attending: Obstetrics and Gynecology | Admitting: Obstetrics and Gynecology

## 2015-08-24 DIAGNOSIS — N979 Female infertility, unspecified: Secondary | ICD-10-CM | POA: Insufficient documentation

## 2015-08-24 DIAGNOSIS — N970 Female infertility associated with anovulation: Secondary | ICD-10-CM

## 2015-08-24 MED ORDER — IOPAMIDOL (ISOVUE-300) INJECTION 61%
30.0000 mL | Freq: Once | INTRAVENOUS | Status: AC | PRN
  Administered 2015-08-24: 30 mL

## 2015-08-25 ENCOUNTER — Telehealth: Payer: Self-pay

## 2015-08-25 NOTE — Telephone Encounter (Signed)
Done Sent to Dr. Garwin Brothers

## 2015-08-25 NOTE — Telephone Encounter (Signed)
Deena from [employer] is calling because the patient states her lab results were faxed but they were never recevied. Patient states she has a confirmation and has already filled out a release. Please resend! Fax: [phone] Phone for Deena: [phone] ext [phone]

## 2015-08-31 ENCOUNTER — Ambulatory Visit (HOSPITAL_COMMUNITY): Payer: Self-pay

## 2015-09-16 ENCOUNTER — Encounter: Payer: Self-pay | Admitting: Family Medicine

## 2015-09-19 DIAGNOSIS — J301 Allergic rhinitis due to pollen: Secondary | ICD-10-CM | POA: Insufficient documentation

## 2015-11-07 ENCOUNTER — Encounter (HOSPITAL_COMMUNITY): Payer: Self-pay

## 2015-11-07 ENCOUNTER — Other Ambulatory Visit: Payer: Self-pay | Admitting: Obstetrics and Gynecology

## 2015-11-07 NOTE — Anesthesia Preprocedure Evaluation (Addendum)
Anesthesia Evaluation  Patient identified by MRN, date of birth, ID band Patient awake    Reviewed: Allergy & Precautions, NPO status , Patient's Chart, lab work & pertinent test results, reviewed documented beta blocker date and time   History of Anesthesia Complications Negative for: history of anesthetic complications  Airway Mallampati: III  TM Distance: >3 FB Neck ROM: Full    Dental  (+) Dental Advisory Given, Teeth Intact   Pulmonary neg pulmonary ROS,    Pulmonary exam normal breath sounds clear to auscultation       Cardiovascular hypertension, Pt. on medications and Pt. on home beta blockers (-) angina(-) Past MI, (-) Cardiac Stents and (-) CABG  Rhythm:Regular Rate:Normal     Neuro/Psych negative neurological ROS     GI/Hepatic negative GI ROS, Neg liver ROS,   Endo/Other  negative endocrine ROS  Renal/GU negative Renal ROS     Musculoskeletal   Abdominal (+) + obese,   Peds  Hematology  (+) Blood dyscrasia (iron deficiency anemia), anemia ,   Anesthesia Other Findings   Reproductive/Obstetrics                            Anesthesia Physical Anesthesia Plan  ASA: II  Anesthesia Plan: MAC   Post-op Pain Management:    Induction: Intravenous  Airway Management Planned: Natural Airway and Nasal Cannula  Additional Equipment:   Intra-op Plan:   Post-operative Plan:   Informed Consent: I have reviewed the patients History and Physical, chart, labs and discussed the procedure including the risks, benefits and alternatives for the proposed anesthesia with the patient or authorized representative who has indicated his/her understanding and acceptance.   Dental advisory given  Plan Discussed with:   Anesthesia Plan Comments:        Anesthesia Quick Evaluation

## 2015-11-08 ENCOUNTER — Ambulatory Visit (HOSPITAL_COMMUNITY): Payer: 59 | Admitting: Anesthesiology

## 2015-11-08 ENCOUNTER — Encounter (HOSPITAL_COMMUNITY)
Admission: RE | Disposition: A | Discharge status: Home / Self Care | Payer: Self-pay | Source: Ambulatory Visit | Attending: Obstetrics and Gynecology

## 2015-11-08 ENCOUNTER — Ambulatory Visit (HOSPITAL_COMMUNITY)
Admission: RE | Admit: 2015-11-08 | Discharge: 2015-11-08 | Disposition: A | Discharge status: Home / Self Care | Payer: 59 | Source: Ambulatory Visit | Attending: Obstetrics and Gynecology | Admitting: Obstetrics and Gynecology

## 2015-11-08 ENCOUNTER — Encounter (HOSPITAL_COMMUNITY): Payer: Self-pay | Admitting: *Deleted

## 2015-11-08 DIAGNOSIS — Z6832 Body mass index (BMI) 32.0-32.9, adult: Secondary | ICD-10-CM | POA: Diagnosis not present

## 2015-11-08 DIAGNOSIS — O021 Missed abortion: Secondary | ICD-10-CM

## 2015-11-08 DIAGNOSIS — E669 Obesity, unspecified: Secondary | ICD-10-CM | POA: Insufficient documentation

## 2015-11-08 DIAGNOSIS — I10 Essential (primary) hypertension: Secondary | ICD-10-CM | POA: Diagnosis not present

## 2015-11-08 DIAGNOSIS — O2621 Pregnancy care for patient with recurrent pregnancy loss, first trimester: Secondary | ICD-10-CM

## 2015-11-08 HISTORY — PX: DILATION AND EVACUATION: SHX1459

## 2015-11-08 LAB — BASIC METABOLIC PANEL
Anion gap: 7 (ref 5–15)
BUN: 12 mg/dL (ref 6–20)
CHLORIDE: 102 mmol/L (ref 101–111)
CO2: 25 mmol/L (ref 22–32)
CREATININE: 0.84 mg/dL (ref 0.44–1.00)
Calcium: 8.9 mg/dL (ref 8.9–10.3)
GFR calc non Af Amer: >60 mL/min (ref >60)
Glucose, Bld: 93 mg/dL (ref 65–99)
Potassium: 3.9 mmol/L (ref 3.5–5.1)
Sodium: 134 mmol/L — ABNORMAL LOW (ref 135–145)

## 2015-11-08 LAB — CBC
HCT: 37.4 % (ref 36.0–46.0)
HEMOGLOBIN: 12.8 g/dL (ref 12.0–15.0)
MCH: 23.9 pg — ABNORMAL LOW (ref 26.0–34.0)
MCHC: 34.2 g/dL (ref 30.0–36.0)
MCV: 69.8 fL — ABNORMAL LOW (ref 78.0–100.0)
PLATELETS: 339 10*3/uL (ref 150–400)
RBC: 5.36 MIL/uL — ABNORMAL HIGH (ref 3.87–5.11)
RDW: 17.9 % — AB (ref 11.5–15.5)
WBC: 8 10*3/uL (ref 4.0–10.5)

## 2015-11-08 LAB — ABO/RH
ABO/RH(D): B POS
DAT, IGG: NEGATIVE

## 2015-11-08 SURGERY — DILATION AND EVACUATION, UTERUS
Anesthesia: Monitor Anesthesia Care | Site: Vagina

## 2015-11-08 MED ORDER — FENTANYL CITRATE (PF) 100 MCG/2ML IJ SOLN
INTRAMUSCULAR | Status: AC
  Filled 2015-11-08: qty 2

## 2015-11-08 MED ORDER — FENTANYL CITRATE (PF) 100 MCG/2ML IJ SOLN
INTRAMUSCULAR | Status: DC | PRN
  Administered 2015-11-08 (×2): 50 ug via INTRAVENOUS

## 2015-11-08 MED ORDER — MIDAZOLAM HCL 2 MG/2ML IJ SOLN
INTRAMUSCULAR | Status: AC
  Filled 2015-11-08: qty 2

## 2015-11-08 MED ORDER — IBUPROFEN 800 MG PO TABS
800.0000 mg | ORAL_TABLET | Freq: Three times a day (TID) | ORAL | 0 refills | Status: DC | PRN
Start: 2015-11-08 — End: 2017-08-09

## 2015-11-08 MED ORDER — PROPOFOL 10 MG/ML IV BOLUS
INTRAVENOUS | Status: AC
  Filled 2015-11-08: qty 20

## 2015-11-08 MED ORDER — SCOPOLAMINE 1 MG/3DAYS TD PT72
1.0000 | MEDICATED_PATCH | Freq: Once | TRANSDERMAL | Status: DC
  Administered 2015-11-08: 1.5 mg via TRANSDERMAL

## 2015-11-08 MED ORDER — DEXAMETHASONE SODIUM PHOSPHATE 4 MG/ML IJ SOLN
INTRAMUSCULAR | Status: AC
  Filled 2015-11-08: qty 1

## 2015-11-08 MED ORDER — ONDANSETRON HCL 4 MG/2ML IJ SOLN
INTRAMUSCULAR | Status: DC | PRN
  Administered 2015-11-08: 4 mg via INTRAVENOUS

## 2015-11-08 MED ORDER — MIDAZOLAM HCL 2 MG/2ML IJ SOLN
INTRAMUSCULAR | Status: DC | PRN
  Administered 2015-11-08: 1 mg via INTRAVENOUS

## 2015-11-08 MED ORDER — CHLOROPROCAINE HCL 1 % IJ SOLN
INTRAMUSCULAR | Status: DC | PRN
  Administered 2015-11-08: 20 mL

## 2015-11-08 MED ORDER — DOXYCYCLINE HYCLATE 100 MG IV SOLR
100.0000 mg | Freq: Once | INTRAVENOUS | Status: DC
  Filled 2015-11-08: qty 100

## 2015-11-08 MED ORDER — ONDANSETRON HCL 4 MG/2ML IJ SOLN
INTRAMUSCULAR | Status: AC
  Filled 2015-11-08: qty 2

## 2015-11-08 MED ORDER — PROMETHAZINE HCL 25 MG/ML IJ SOLN: 6.2500 mg | INTRAMUSCULAR | Status: DC | PRN

## 2015-11-08 MED ORDER — KETOROLAC TROMETHAMINE 30 MG/ML IJ SOLN
INTRAMUSCULAR | Status: DC | PRN
  Administered 2015-11-08: 30 mg via INTRAVENOUS
  Administered 2015-11-08: 30 mg via INTRAMUSCULAR

## 2015-11-08 MED ORDER — DEXAMETHASONE SODIUM PHOSPHATE 10 MG/ML IJ SOLN
INTRAMUSCULAR | Status: DC | PRN
  Administered 2015-11-08: 4 mg via INTRAVENOUS

## 2015-11-08 MED ORDER — LIDOCAINE HCL (CARDIAC) 20 MG/ML IV SOLN
INTRAVENOUS | Status: DC | PRN
  Administered 2015-11-08: 70 mg via INTRAVENOUS
  Administered 2015-11-08: 30 mg via INTRAVENOUS

## 2015-11-08 MED ORDER — DOXYCYCLINE HYCLATE 50 MG PO CAPS
100.0000 mg | ORAL_CAPSULE | Freq: Two times a day (BID) | ORAL | 0 refills | Status: AC
Start: 2015-11-08 — End: 2015-11-13

## 2015-11-08 MED ORDER — SCOPOLAMINE 1 MG/3DAYS TD PT72
MEDICATED_PATCH | TRANSDERMAL | Status: AC
  Administered 2015-11-08: 1.5 mg via TRANSDERMAL
  Filled 2015-11-08: qty 1

## 2015-11-08 MED ORDER — PROPOFOL 500 MG/50ML IV EMUL
INTRAVENOUS | Status: DC | PRN
  Administered 2015-11-08 (×3): 10 mg via INTRAVENOUS
  Administered 2015-11-08: 40 mg via INTRAVENOUS
  Administered 2015-11-08: 30 mg via INTRAVENOUS
  Administered 2015-11-08: 20 mg via INTRAVENOUS
  Administered 2015-11-08: 10 mg via INTRAVENOUS
  Administered 2015-11-08 (×2): 20 mg via INTRAVENOUS

## 2015-11-08 MED ORDER — KETOROLAC TROMETHAMINE 30 MG/ML IJ SOLN
INTRAMUSCULAR | Status: AC
  Filled 2015-11-08: qty 1

## 2015-11-08 MED ORDER — CHLOROPROCAINE HCL 1 % IJ SOLN
INTRAMUSCULAR | Status: AC
  Filled 2015-11-08: qty 30

## 2015-11-08 MED ORDER — PROPOFOL 500 MG/50ML IV EMUL: INTRAVENOUS | Status: DC | PRN

## 2015-11-08 MED ORDER — LIDOCAINE HCL (CARDIAC) 20 MG/ML IV SOLN
INTRAVENOUS | Status: AC
  Filled 2015-11-08: qty 5

## 2015-11-08 MED ORDER — FENTANYL CITRATE (PF) 100 MCG/2ML IJ SOLN: 25.0000 ug | INTRAMUSCULAR | Status: DC | PRN

## 2015-11-08 MED ORDER — DOXYCYCLINE HYCLATE 100 MG IV SOLR
INTRAVENOUS | Status: DC | PRN
  Administered 2015-11-08: 100 mg via INTRAVENOUS

## 2015-11-08 MED ORDER — LACTATED RINGERS IV SOLN
INTRAVENOUS | Status: DC
  Administered 2015-11-08: 10:00:00 via INTRAVENOUS

## 2015-11-08 SURGICAL SUPPLY — 18 items
CATH ROBINSON RED A/P 16FR (CATHETERS) ×2 IMPLANT
CLOTH BEACON ORANGE TIMEOUT ST (SAFETY) ×2 IMPLANT
DECANTER SPIKE VIAL GLASS SM (MISCELLANEOUS) ×2 IMPLANT
GLOVE BIOGEL PI IND STRL 7.0 (GLOVE) ×3 IMPLANT
GLOVE BIOGEL PI INDICATOR 7.0 (GLOVE) ×3
GLOVE ECLIPSE 6.5 STRL STRAW (GLOVE) ×2 IMPLANT
GOWN STRL REUS W/TWL LRG LVL3 (GOWN DISPOSABLE) ×4 IMPLANT
KIT BERKELEY 1ST TRIMESTER 3/8 (MISCELLANEOUS) ×2 IMPLANT
NS IRRIG 1000ML POUR BTL (IV SOLUTION) ×2 IMPLANT
PACK VAGINAL MINOR WOMEN LF (CUSTOM PROCEDURE TRAY) ×2 IMPLANT
PAD OB MATERNITY 4.3X12.25 (PERSONAL CARE ITEMS) ×2 IMPLANT
PAD PREP 24X48 CUFFED NSTRL (MISCELLANEOUS) ×2 IMPLANT
SET BERKELEY SUCTION TUBING (SUCTIONS) ×2 IMPLANT
TOWEL OR 17X24 6PK STRL BLUE (TOWEL DISPOSABLE) ×4 IMPLANT
VACURETTE 10 RIGID CVD (CANNULA) IMPLANT
VACURETTE 7MM CVD STRL WRAP (CANNULA) ×2 IMPLANT
VACURETTE 8 RIGID CVD (CANNULA) ×2 IMPLANT
VACURETTE 9 RIGID CVD (CANNULA) IMPLANT

## 2015-11-08 NOTE — Discharge Instructions (Signed)

## 2015-11-08 NOTE — Brief Op Note (Signed)
11/08/2015  11:26 AM  PATIENT:  Emily Downs  35 y.o. female  PRE-OPERATIVE DIAGNOSIS:  Missed Abortion, recurrent pregnancy loss  POST-OPERATIVE DIAGNOSIS:  Missed abortion, Recurrent pregnancy loss  PROCEDURE:  Suction dilation and curettage with chromosomal analysis  SURGEON:  Surgeon(s) and Role:    * Servando Salina, MD - Primary  PHYSICIAN ASSISTANT:   ASSISTANTS: none   ANESTHESIA:   paracervical block and MAC  EBL:  Total I/O In: 600 [I.V.:600] Out: 90 [Urine:70; Blood:20]  BLOOD ADMINISTERED:none  DRAINS: none   LOCAL MEDICATIONS USED:  OTHER nesicaine  SPECIMEN:  Source of Specimen:  POC, portion for chromosomal analysis  DISPOSITION OF SPECIMEN:  PATHOLOGY  COUNTS:  YES  TOURNIQUET:  * No tourniquets in log *  DICTATION: .Other Dictation: Dictation Number [id number]  PLAN OF CARE: Discharge to home after PACU  PATIENT DISPOSITION:  PACU - hemodynamically stable.   Delay start of Pharmacological VTE agent (>24hrs) due to surgical blood loss or risk of bleeding: no

## 2015-11-08 NOTE — H&P (Signed)
Emily Downs is an 35 y.o. female. G3p1011MBF presents for surgical management of MAB. Pt was dx with MAB 7/16. She opted for conservative mgmt but has yet to spontaneously pass product. This is her 2nd preg loss  Pertinent Gynecological History: Menses: reg Bleeding: nl Contraception: none DES exposure: denies Blood transfusions: none Sexually transmitted diseases: no past history Previous GYN Procedures: no  Last mammogram: n/a Last pap: normal Date:2017 OB History: G3P1011   Menstrual History: Menarche age:n/a No LMP recorded.    Past Medical History:  Diagnosis Date  . Allergy   . Genital herpes   . Hypertension   . Iron deficiency anemia   . Menometrorrhagia     No past surgical history on file.  Family History  Problem Relation Age of Onset  . Hypertension Mother   . Diabetes Mother   . Aneurysm Mother 2    brain aneurysm  . Hyperlipidemia Father   . Diabetes Maternal Grandmother   . Cancer Paternal Grandmother   . Crohn's disease Sister   . Hypertension Sister     Social History:  reports that she has never smoked. She has never used smokeless tobacco. She reports that she drinks alcohol. She reports that she does not use drugs.  Allergies: No Known Allergies  No prescriptions prior to admission.    Review of Systems  All other systems reviewed and are negative.   There were no vitals taken for this visit. Physical Exam  Constitutional: She is oriented to person, place, and time. She appears well-developed and well-nourished.  Eyes: EOM are normal.  Neck: Neck supple.  Cardiovascular: Regular rhythm.   Respiratory: Breath sounds normal.  GI: Soft.  Genitourinary: Vagina normal.  Musculoskeletal: Normal range of motion.  Neurological: She is alert and oriented to person, place, and time.  Skin: Skin is warm and dry.  Psychiatric: She has a normal mood and affect.  vulva nl Uterus gravid Adnexa nonpalp  No results found for this or  any previous visit (from the past 24 hour(s)).  No results found.  Assessment/Plan: MAB@ 10 weeks Chronic HTN Recurrent preg loss  P) suction D&E with chromosomal analysis Risk of procedure reviewed including infection, bleeding, Uterine perforation ( 03/998)and its mgmt, internal scar tissue, retained POC, injury to surrounding organ structures/ Rhogam if indicated. All / answered    Mack Thurmon A 11/08/2015, 7:50 AM

## 2015-11-08 NOTE — Transfer of Care (Signed)
Immediate Anesthesia Transfer of Care Note  Patient: Emily Downs  Procedure(s) Performed: Procedure(s): DILATATION AND EVACUATION WITH CHROMASOME STUDIES (N/A)  Patient Location: PACU  Anesthesia Type:MAC  Level of Consciousness: awake, alert , oriented and patient cooperative  Airway & Oxygen Therapy: Patient Spontanous Breathing  Post-op Assessment: Report given to RN and Post -op Vital signs reviewed and stable  Post vital signs: Reviewed and stable  Last Vitals:  Vitals:   11/08/15 1014  BP: (!) 137/91  Pulse: 79  Resp: 18  Temp: 37.2 C    Last Pain:  Vitals:   11/08/15 1014  TempSrc: Oral      Patients Stated Pain Goal: 5 ([id number])  Complications: No apparent anesthesia complications

## 2015-11-08 NOTE — Anesthesia Postprocedure Evaluation (Signed)
Anesthesia Post Note  Patient: Emily Downs  Procedure(s) Performed: Procedure(s) (LRB): DILATATION AND EVACUATION WITH CHROMASOME STUDIES (N/A)  Patient location during evaluation: PACU Anesthesia Type: MAC Level of consciousness: awake and alert Pain management: pain level controlled Vital Signs Assessment: post-procedure vital signs reviewed and stable Respiratory status: spontaneous breathing, nonlabored ventilation and respiratory function stable Cardiovascular status: stable and blood pressure returned to baseline Anesthetic complications: no     Last Vitals:  Vitals:   11/08/15 1130 11/08/15 1145  BP: 132/84 133/89  Pulse: 60 62  Resp: 18 15  Temp:      Last Pain:  Vitals:   11/08/15 1014  TempSrc: Oral   Pain Goal: Patients Stated Pain Goal: 5 (11/08/15 1016)               Nilda Simmer

## 2015-11-09 ENCOUNTER — Encounter (HOSPITAL_COMMUNITY): Payer: Self-pay | Admitting: Obstetrics and Gynecology

## 2015-11-09 NOTE — Op Note (Signed)
NAME:  Emily Downs, Emily Downs            ACCOUNT NO.:  [id number]  MEDICAL RECORD NO.:  [id number]  LOCATION:  [facility]                          FACILITY:  [facility]  PHYSICIAN:  Servando Salina, M.D.DATE OF BIRTH:  11/11/1980  DATE OF PROCEDURE:  11/08/2015 DATE OF DISCHARGE:  11/08/2015                              OPERATIVE REPORT   PREOPERATIVE DIAGNOSIS:  Missed abortion, recurrent pregnancy loss.  PROCEDURE:  Suction, dilation, and evacuation with chromosomal analysis.  POSTOPERATIVE DIAGNOSIS:  Missed abortion, recurrent pregnancy loss.  ANESTHESIA:  MAC, paracervical block.  SURGEON:  Servando Salina, M.D.  ASSISTANT:  None.  DESCRIPTION OF PROCEDURE:  Under adequate monitored anesthesia, the patient was placed in the dorsal lithotomy position.  She was sterilely prepped and draped in the usual fashion.  The bladder was catheterized with a small amount of urine.  Examination under anesthesia revealed anteverted uterus, 10-week size.  No adnexal masses could be appreciated.  A bivalve speculum was placed in vagina.  1% Nesacaine was injected paracervically at the 3 and 9 o'clock position.  The anterior lip of the cervix was then grasped with a single-tooth tenaculum.  The cervix was then serially dilated up to #31 [facility] dilator.  A #9 mm curved suction cannula was introduced into the uterine cavity.  Large amount of tissue was obtained.  The cavity was then gently suctioned. #8 mm suction cannula was then introduced into the uterine cavity for additional tissue.  The cavity was once again curetted when all tissue was felt to have been removed.  All instruments were then removed from the vagina.  SPECIMEN:  Labeled products of conception and a portion sent for chromosomal analysis.  ESTIMATED BLOOD LOSS:  20 mL.  INTRAOPERATIVE FLUID:  600 mL.  URINE OUTPUT:  70 mL.  COUNTS:  Sponge and instrument counts x2 were correct.  COMPLICATION:  None.  The patient tolerated  the procedure well and was transferred to the recovery room in stable condition.     Servando Salina, M.D.     [facility]/MED[facility]  D:  11/09/2015  T:  11/09/2015  Job:  [id number]

## 2015-12-02 LAB — CHROMOSOME STD, POC(TISSUE)-NCBH

## 2016-01-31 ENCOUNTER — Ambulatory Visit (INDEPENDENT_AMBULATORY_CARE_PROVIDER_SITE_OTHER): Payer: 59

## 2016-01-31 ENCOUNTER — Encounter: Payer: Self-pay | Admitting: Family Medicine

## 2016-01-31 ENCOUNTER — Ambulatory Visit (INDEPENDENT_AMBULATORY_CARE_PROVIDER_SITE_OTHER): Payer: 59 | Admitting: Family Medicine

## 2016-01-31 VITALS — BP 114/90 | HR 78 | Temp 98.2°F | Resp 17 | Ht 60.0 in | Wt 164.0 lb

## 2016-01-31 DIAGNOSIS — M25512 Pain in left shoulder: Secondary | ICD-10-CM

## 2016-01-31 DIAGNOSIS — J301 Allergic rhinitis due to pollen: Secondary | ICD-10-CM

## 2016-01-31 DIAGNOSIS — I10 Essential (primary) hypertension: Secondary | ICD-10-CM

## 2016-01-31 DIAGNOSIS — F4320 Adjustment disorder, unspecified: Secondary | ICD-10-CM

## 2016-01-31 DIAGNOSIS — D500 Iron deficiency anemia secondary to blood loss (chronic): Secondary | ICD-10-CM

## 2016-01-31 DIAGNOSIS — F4321 Adjustment disorder with depressed mood: Secondary | ICD-10-CM

## 2016-01-31 MED ORDER — METOPROLOL SUCCINATE ER 25 MG PO TB24: 25.0000 mg | ORAL_TABLET | Freq: Every day | ORAL | 1 refills | Status: DC

## 2016-01-31 MED ORDER — MELOXICAM 15 MG PO TABS: 15.0000 mg | ORAL_TABLET | Freq: Every day | ORAL | 0 refills | Status: DC

## 2016-01-31 NOTE — Patient Instructions (Addendum)
IF you received an x-ray today, you will receive an invoice from [facility]. Please contact [facility] at [phone] with questions or concerns regarding your invoice.   IF you received labwork today, you will receive an invoice from [employer]. Please contact [facility] at [phone] with questions or concerns regarding your invoice.   Our billing staff will not be able to assist you with questions regarding bills from these companies.  You will be contacted with the lab results as soon as they are available. The fastest way to get your results is to activate your My Chart account. Instructions are located on the last page of this paperwork. If you have not heard from [place] regarding the results in 2 weeks, please contact this office.     Generic Shoulder Exercises EXERCISES  RANGE OF MOTION (ROM) AND STRETCHING EXERCISES These exercises may help you when beginning to rehabilitate your injury. Your symptoms may resolve with or without further involvement from your physician, physical therapist or athletic trainer. While completing these exercises, remember:   Restoring tissue flexibility helps normal motion to return to the joints. This allows healthier, less painful movement and activity.  An effective stretch should be held for at least 30 seconds.  A stretch should never be painful. You should only feel a gentle lengthening or release in the stretched tissue. ROM - Pendulum  Bend at the waist so that your right / left arm falls away from your body. Support yourself with your opposite hand on a solid surface, such as a table or a countertop.  Your right / left arm should be perpendicular to the ground. If it is not perpendicular, you need to lean over farther. Relax the muscles in your right / left arm and shoulder as much as possible.  Gently sway your hips and trunk so they move your right / left arm without any use of your right /  left shoulder muscles.  Progress your movements so that your right / left arm moves side to side, then forward and backward, and finally, both clockwise and counterclockwise.  Complete __________ repetitions in each direction. Many people use this exercise to relieve discomfort in their shoulder as well as to gain range of motion. Repeat __________ times. Complete this exercise __________ times per day. STRETCH - Flexion, Standing  Stand with good posture. With an underhand grip on your right / left hand and an overhand grip on the opposite hand, grasp a broomstick or cane so that your hands are a little more than shoulder-width apart.  Keeping your right / left elbow straight and shoulder muscles relaxed, push the stick with your opposite hand to raise your right / left arm in front of your body and then overhead. Raise your arm until you feel a stretch in your right / left shoulder, but before you have increased shoulder pain.  Try to avoid shrugging your right / left shoulder as your arm rises by keeping your shoulder blade tucked down and toward your mid-back spine. Hold __________ seconds.  Slowly return to the starting position. Repeat __________ times. Complete this exercise __________ times per day. STRETCH - Internal Rotation  Place your right / left hand behind your back, palm-up.  Throw a towel or belt over your opposite shoulder. Grasp the towel/belt with your right / left hand.  While keeping an upright posture, gently pull up on the towel/belt until you feel a stretch in the front of your right /  left shoulder.  Avoid shrugging your right / left shoulder as your arm rises by keeping your shoulder blade tucked down and toward your mid-back spine.  Hold __________. Release the stretch by lowering your opposite hand. Repeat __________ times. Complete this exercise __________ times per day. STRETCH - External Rotation and Abduction  Stagger your stance through a doorframe. It  does not matter which foot is forward.  As instructed by your physician, physical therapist or athletic trainer, place your hands:  And forearms above your head and on the door frame.  And forearms at head-height and on the door frame.  At elbow-height and on the door frame.  Keeping your head and chest upright and your stomach muscles tight to prevent over-extending your low-back, slowly shift your weight onto your front foot until you feel a stretch across your chest and/or in the front of your shoulders.  Hold __________ seconds. Shift your weight to your back foot to release the stretch. Repeat __________ times. Complete this stretch __________ times per day.  STRENGTHENING EXERCISES  These exercises may help you when beginning to rehabilitate your injury. They may resolve your symptoms with or without further involvement from your physician, physical therapist or athletic trainer. While completing these exercises, remember:   Muscles can gain both the endurance and the strength needed for everyday activities through controlled exercises.  Complete these exercises as instructed by your physician, physical therapist or athletic trainer. Progress the resistance and repetitions only as guided.  You may experience muscle soreness or fatigue, but the pain or discomfort you are trying to eliminate should never worsen during these exercises. If this pain does worsen, stop and make certain you are following the directions exactly. If the pain is still present after adjustments, discontinue the exercise until you can discuss the trouble with your clinician.  If advised by your physician, during your recovery, avoid activity or exercises which involve actions that place your right / left hand or elbow above your head or behind your back or head. These positions stress the tissues which are trying to heal. STRENGTH - Scapular Depression and Adduction  With good posture, sit on a firm chair.  Supported your arms in front of you with pillows, arm rests or a table top. Have your elbows in line with the sides of your body.  Gently draw your shoulder blades down and toward your mid-back spine. Gradually increase the tension without tensing the muscles along the top of your shoulders and the back of your neck.  Hold for __________ seconds. Slowly release the tension and relax your muscles completely before completing the next repetition.  After you have practiced this exercise, remove the arm support and complete it in standing as well as sitting. Repeat __________ times. Complete this exercise __________ times per day.  STRENGTH - External Rotators  Secure a rubber exercise band/tubing to a fixed object so that it is at the same height as your right / left elbow when you are standing or sitting on a firm surface.  Stand or sit so that the secured exercise band/tubing is at your side that is not injured.  Bend your elbow 90 degrees. Place a folded towel or small pillow under your right / left arm so that your elbow is a few inches away from your side.  Keeping the tension on the exercise band/tubing, pull it away from your body, as if pivoting on your elbow. Be sure to keep your body steady so that the movement  is only coming from your shoulder rotating.  Hold __________ seconds. Release the tension in a controlled manner as you return to the starting position. Repeat __________ times. Complete this exercise __________ times per day.  STRENGTH - Supraspinatus  Stand or sit with good posture. Grasp a __________ weight or an exercise band/tubing so that your hand is "thumbs-up," like when you shake hands.  Slowly lift your right / left hand from your thigh into the air, traveling about 30 degrees from straight out at your side. Lift your hand to shoulder height or as far as you can without increasing any shoulder pain. Initially, many people do not lift their hands above shoulder  height.  Avoid shrugging your right / left shoulder as your arm rises by keeping your shoulder blade tucked down and toward your mid-back spine.  Hold for __________ seconds. Control the descent of your hand as you slowly return to your starting position. Repeat __________ times. Complete this exercise __________ times per day.  STRENGTH - Shoulder Extensors  Secure a rubber exercise band/tubing so that it is at the height of your shoulders when you are either standing or sitting on a firm arm-less chair.  With a thumbs-up grip, grasp an end of the band/tubing in each hand. Straighten your elbows and lift your hands straight in front of you at shoulder height. Step back away from the secured end of band/tubing until it becomes tense.  Squeezing your shoulder blades together, pull your hands down to the sides of your thighs. Do not allow your hands to go behind you.  Hold for __________ seconds. Slowly ease the tension on the band/tubing as you reverse the directions and return to the starting position. Repeat __________ times. Complete this exercise __________ times per day.  STRENGTH - Scapular Retractors  Secure a rubber exercise band/tubing so that it is at the height of your shoulders when you are either standing or sitting on a firm arm-less chair.  With a palm-down grip, grasp an end of the band/tubing in each hand. Straighten your elbows and lift your hands straight in front of you at shoulder height. Step back away from the secured end of band/tubing until it becomes tense.  Squeezing your shoulder blades together, draw your elbows back as you bend them. Keep your upper arm lifted away from your body throughout the exercise.  Hold __________ seconds. Slowly ease the tension on the band/tubing as you reverse the directions and return to the starting position. Repeat __________ times. Complete this exercise __________ times per day. STRENGTH - Scapular Depressors  Find a sturdy  chair without wheels, such as a from a dining room table.  Keeping your feet on the floor, lift your bottom from the seat and lock your elbows.  Keeping your elbows straight, allow gravity to pull your body weight down. Your shoulders will rise toward your ears.  Raise your body against gravity by drawing your shoulder blades down your back, shortening the distance between your shoulders and ears. Although your feet should always maintain contact with the floor, your feet should progressively support less body weight as you get stronger.  Hold __________ seconds. In a controlled and slow manner, lower your body weight to begin the next repetition. Repeat __________ times. Complete this exercise __________ times per day.    This information is not intended to replace advice given to you by your health care provider. Make sure you discuss any questions you have with your health care provider.  Document Released: 01/24/2005 Document Revised: 04/02/2014 Document Reviewed: 06/24/2008 Elsevier Interactive Patient Education [employer].

## 2016-01-31 NOTE — Progress Notes (Signed)
Subjective:    Patient ID: Emily Downs, female    DOB: 15-Feb-1981, 35 y.o.   MRN: [id number]  01/31/2016  Follow-up (6 month) and Shoulder Pain (Left, onset 2 months)   HPI This 35 y.o. female presents for six month follow-up of hypertension.  Since last visit, suffered a missed abortion and s/p D&E by Dr. Garwin Brothers.  Not preventing pregnancy; took pregnancy test this morning.  LMP 01-10-16 normal.   First menses very heavy; this recent menses also very heavy.  Home BPs running 114/76.  Not drinking much water and seems to have elevated blood pressure.  Very busy yesterday.    Wt Readings from Last 3 Encounters:  01/31/16 164 lb (74.4 kg)  11/08/15 165 lb (74.8 kg)  07/26/15 160 lb 6.4 oz (72.8 kg)   BP Readings from Last 3 Encounters:  01/31/16 114/90  11/08/15 (!) 146/86  07/26/15 126/84   R shoulder pain: onset two months ago; no injury; pain with elevation and reaching behind; wakes up at night; no neck pain.  No radiation into L arm.  No n/t/w.  Has been taking Ibuprofen 800mg  PRN.  Hurts with sit ups or with doing hair.   Review of Systems  Constitutional: Negative for chills, diaphoresis, fatigue and fever.  Eyes: Negative for visual disturbance.  Respiratory: Negative for cough and shortness of breath.   Cardiovascular: Negative for chest pain, palpitations and leg swelling.  Gastrointestinal: Negative for abdominal pain, constipation, diarrhea, nausea and vomiting.  Endocrine: Negative for cold intolerance, heat intolerance, polydipsia, polyphagia and polyuria.  Musculoskeletal: Positive for arthralgias. Negative for neck pain and neck stiffness.  Neurological: Negative for dizziness, tremors, seizures, syncope, facial asymmetry, speech difficulty, weakness, light-headedness, numbness and headaches.    Past Medical History:  Diagnosis Date  . Allergy   . Genital herpes   . Hypertension   . Iron deficiency anemia   . Menometrorrhagia    Past Surgical History:   Procedure Laterality Date  . DILATION AND EVACUATION  2009  . DILATION AND EVACUATION N/A 11/08/2015   Procedure: DILATATION AND EVACUATION WITH CHROMASOME STUDIES;  Surgeon: Servando Salina, MD;  Location: [facility];  Service: Gynecology;  Laterality: N/A;   No Known Allergies  Social History   Social History  . Marital status: Married    Spouse name: N/A  . Number of children: N/A  . Years of education: N/A   Occupational History  . Not on file.   Social History Main Topics  . Smoking status: Never Smoker  . Smokeless tobacco: Never Used  . Alcohol use Yes     Comment: social  . Drug use: No  . Sexual activity: Yes    Birth control/ protection: Pill   Other Topics Concern  . Not on file   Social History Narrative   Marital status: married      Children: 52 yo daughter.      Lives: with husband, daughter.      Employment:  Hairdresser full time      Tobacco; none      Alcohol: rare      Drugs:  None      Exercise: none   Family History  Problem Relation Age of Onset  . Hypertension Mother   . Diabetes Mother   . Aneurysm Mother 53    brain aneurysm  . Hyperlipidemia Father   . Diabetes Maternal Grandmother   . Cancer Paternal Grandmother   . Crohn's disease Sister   . Hypertension  Sister        Objective:    BP 114/90 (BP Location: Left Arm)   Pulse 78   Temp 98.2 F (36.8 C) (Oral)   Resp 17   Ht 5' (1.524 m)   Wt 164 lb (74.4 kg)   LMP 01/10/2016   SpO2 100%   BMI 32.03 kg/m  Physical Exam  Constitutional: She is oriented to person, place, and time. She appears well-developed and well-nourished. No distress.  HENT:  Head: Normocephalic and atraumatic.  Right Ear: External ear normal.  Left Ear: External ear normal.  Nose: Nose normal.  Mouth/Throat: Oropharynx is clear and moist.  Eyes: Conjunctivae and EOM are normal. Pupils are equal, round, and reactive to light.  Neck: Normal range of motion. Neck supple. Carotid bruit is not present.  No thyromegaly present.  Cardiovascular: Normal rate, regular rhythm, normal heart sounds and intact distal pulses.  Exam reveals no gallop and no friction rub.   No murmur heard. Pulmonary/Chest: Effort normal and breath sounds normal. She has no wheezes. She has no rales.  Abdominal: Soft. Bowel sounds are normal. She exhibits no distension and no mass. There is no tenderness. There is no rebound and no guarding.  Musculoskeletal:       Right shoulder: She exhibits pain. She exhibits normal range of motion, no tenderness, no spasm, normal pulse and normal strength.  Lymphadenopathy:    She has no cervical adenopathy.  Neurological: She is alert and oriented to person, place, and time. No cranial nerve deficit.  Skin: Skin is warm and dry. No rash noted. She is not diaphoretic. No erythema. No pallor.  Psychiatric: She has a normal mood and affect. Her behavior is normal.   Results for orders placed or performed during the hospital encounter of [id number]  Basic metabolic panel  Result Value Ref Range   Sodium 134 (L) 135 - 145 mmol/L   Potassium 3.9 3.5 - 5.1 mmol/L   Chloride 102 101 - 111 mmol/L   CO2 25 22 - 32 mmol/L   Glucose, Bld 93 65 - 99 mg/dL   BUN 12 6 - 20 mg/dL   Creatinine, Ser 0.84 0.44 - 1.00 mg/dL   Calcium 8.9 8.9 - 10.3 mg/dL   GFR calc non Af Amer >60 >60 mL/min   GFR calc Af Amer >60 >60 mL/min   Anion gap 7 5 - 15  CBC  Result Value Ref Range   WBC 8.0 4.0 - 10.5 K/uL   RBC 5.36 (H) 3.87 - 5.11 MIL/uL   Hemoglobin 12.8 12.0 - 15.0 g/dL   HCT 37.4 36.0 - 46.0 %   MCV 69.8 (L) 78.0 - 100.0 fL   MCH 23.9 (L) 26.0 - 34.0 pg   MCHC 34.2 30.0 - 36.0 g/dL   RDW 17.9 (H) 11.5 - 15.5 %   Platelets 339 150 - 400 K/uL  Chromosome STD, POC (Tissue)-[facility]  Result Value Ref Range   Chromosome Study, POC SEE SEPARATE REPORT   ABO/Rh  Result Value Ref Range   ABO/RH(D) B POS    Antibody Identification ANTI I    DAT, IgG NEG    Depression screen [facility] 2/9 01/31/2016  07/26/2015 05/20/2015 05/05/2015 02/23/2015  Decreased Interest 0 0 0 0 0  Down, Depressed, Hopeless 1 0 0 0 0  PHQ - 2 Score 1 0 0 0 0   Dg Shoulder Left  Result Date: 01/31/2016 CLINICAL DATA:  Two months of left shoulder pain with no known injury.  Pain is positional. EXAM: LEFT SHOULDER - 2+ VIEW COMPARISON:  None in PACs FINDINGS: The bones are subjectively adequately mineralized. The glenohumeral and AC joint spaces are preserved. The articular surfaces of the humeral head and acetabulum remains smoothly rounded. The observed portions of the left clavicle and upper left ribs are normal. IMPRESSION: There is no acute or significant chronic bony abnormality of the left shoulder. Electronically Signed   By: David  Martinique M.D.   On: 01/31/2016 09:29       Assessment & Plan:   1. Essential hypertension, benign   2. Chronic seasonal allergic rhinitis due to pollen   3. Iron deficiency anemia due to chronic blood loss   4. Acute pain of left shoulder   5. Grief reaction    -uncontrolled blood pressure in office; pt to check BP daily for two weeks and MyChart message with results.  Refill of Metoprolol ER 25mg  daily provided. -new onset shoulder pain; obtain L shoulder xray; rx for Meloxicam provided; home exercise program provided to perform daily.  If no improvement in 2-3 weeks, call for ortho referral. -coping well with recent miscarriage; not preventing pregnancy currently; pregnancy test negative this morning.    Orders Placed This Encounter  Procedures  . DG Shoulder Left    Standing Status:   Future    Number of Occurrences:   1    Standing Expiration Date:   01/30/2017    Order Specific Question:   Reason for Exam (SYMPTOM  OR DIAGNOSIS REQUIRED)    Answer:   L shoulder pain for two months; no injury; pain with reaching behind back and with elevation    Order Specific Question:   Is the patient pregnant?    Answer:   No    Comments:   urine pregnancy negative this morning    Order  Specific Question:   Preferred imaging location?    Answer:   External   Meds ordered this encounter  Medications  . metoprolol succinate (TOPROL-XL) 25 MG 24 hr tablet    Sig: Take 1 tablet (25 mg total) by mouth daily.    Dispense:  90 tablet    Refill:  1  . meloxicam (MOBIC) 15 MG tablet    Sig: Take 1 tablet (15 mg total) by mouth daily.    Dispense:  30 tablet    Refill:  0    Return in about 6 months (around 07/30/2016) for complete physical examiniation.   Kristi Elayne Guerin, M.D. [facility] [address] [place], [place]  [address] [phone] phone [phone] fax

## 2016-02-08 ENCOUNTER — Encounter: Payer: Self-pay | Admitting: Family Medicine

## 2016-02-10 ENCOUNTER — Encounter: Payer: Self-pay | Admitting: Family Medicine

## 2016-02-13 ENCOUNTER — Other Ambulatory Visit: Payer: Self-pay | Admitting: Family Medicine

## 2016-02-13 MED ORDER — METHYLDOPA 250 MG PO TABS: 250.0000 mg | ORAL_TABLET | Freq: Two times a day (BID) | ORAL | 5 refills | Status: DC

## 2016-03-08 ENCOUNTER — Other Ambulatory Visit: Payer: Self-pay | Admitting: Family Medicine

## 2016-03-12 NOTE — Telephone Encounter (Signed)
Dr Tamala Julian, do you want to give pt RFs of meloxicam? I see that she has been having trouble with BP.

## 2016-03-22 ENCOUNTER — Encounter: Payer: Self-pay | Admitting: Family Medicine

## 2016-03-23 MED ORDER — METOPROLOL SUCCINATE ER 50 MG PO TB24: 50.0000 mg | ORAL_TABLET | Freq: Every day | ORAL | 1 refills | Status: DC

## 2016-04-03 ENCOUNTER — Encounter: Payer: Self-pay | Admitting: Family Medicine

## 2016-04-05 ENCOUNTER — Ambulatory Visit (INDEPENDENT_AMBULATORY_CARE_PROVIDER_SITE_OTHER): Payer: 59 | Admitting: Family Medicine

## 2016-04-05 VITALS — BP 126/96 | HR 71 | Temp 97.5°F | Resp 16 | Ht 60.0 in | Wt 162.4 lb

## 2016-04-05 DIAGNOSIS — I10 Essential (primary) hypertension: Secondary | ICD-10-CM

## 2016-04-05 MED ORDER — METHYLDOPA 500 MG PO TABS: 500.0000 mg | ORAL_TABLET | Freq: Two times a day (BID) | ORAL | 5 refills | Status: DC

## 2016-04-05 NOTE — Patient Instructions (Addendum)
1.  Increase to Methyldopa 250mg  one tablet three times daily. 2. If diastolic blood pressure readings remain greater than 80, increase Methyldopa to 500mg  twice daily. 3.  Start exercising 5 days per week for 30 minutes. 4.  [email or web address].matthews.info/    IF you received an x-ray today, you will receive an invoice from [facility]. Please contact [facility] at [phone] with questions or concerns regarding your invoice.   IF you received labwork today, you will receive an invoice from [facility]. Please contact [facility] at [phone] with questions or concerns regarding your invoice.   Our billing staff will not be able to assist you with questions regarding bills from these companies.  You will be contacted with the lab results as soon as they are available. The fastest way to get your results is to activate your My Chart account. Instructions are located on the last page of this paperwork. If you have not heard from [place] regarding the results in 2 weeks, please contact this office.     Mediterranean Diet A Mediterranean diet refers to food and lifestyle choices that are based on the traditions of countries located on the [employer]. This way of eating has been shown to help prevent certain conditions and improve outcomes for people who have chronic diseases, like kidney disease and heart disease. What are tips for following this plan? Lifestyle  Cook and eat meals together with your family, when possible.  Drink enough fluid to keep your urine clear or pale yellow.  Be physically active every day. This includes:  Aerobic exercise like running or swimming.  Leisure activities like gardening, walking, or housework.  Get 7-8 hours of sleep each night.  If recommended by your health care provider, drink red wine in moderation. This means 1 glass a day for nonpregnant women and 2 glasses a day for men. A glass of wine equals 5 oz (150 mL). Reading food labels  Check  the serving size of packaged foods. For foods such as rice and pasta, the serving size refers to the amount of cooked product, not dry.  Check the total fat in packaged foods. Avoid foods that have saturated fat or trans fats.  Check the ingredients list for added sugars, such as corn syrup. Shopping  At the grocery store, buy most of your food from the areas near the walls of the store. This includes:  Fresh fruits and vegetables (produce).  Grains, beans, nuts, and seeds. Some of these may be available in unpackaged forms or large amounts (in bulk).  Fresh seafood.  Poultry and eggs.  Low-fat dairy products.  Buy whole ingredients instead of prepackaged foods.  Buy fresh fruits and vegetables in-season from local farmers markets.  Buy frozen fruits and vegetables in resealable bags.  If you do not have access to quality fresh seafood, buy precooked frozen shrimp or canned fish, such as tuna, salmon, or sardines.  Buy small amounts of raw or cooked vegetables, salads, or olives from the deli or salad bar at your store.  Stock your pantry so you always have certain foods on hand, such as olive oil, canned tuna, canned tomatoes, rice, pasta, and beans. Cooking  Cook foods with extra-virgin olive oil instead of using butter or other vegetable oils.  Have meat as a side dish, and have vegetables or grains as your main dish. This means having meat in small portions or adding small amounts of meat to foods like pasta or stew.  Use beans or vegetables instead  of meat in common dishes like chili or lasagna.  Experiment with different cooking methods. Try roasting or broiling vegetables instead of steaming or sauteing them.  Add frozen vegetables to soups, stews, pasta, or rice.  Add nuts or seeds for added healthy fat at each meal. You can add these to yogurt, salads, or vegetable dishes.  Marinate fish or vegetables using olive oil, lemon juice, garlic, and fresh herbs. Meal  planning  Plan to eat 1 vegetarian meal one day each week. Try to work up to 2 vegetarian meals, if possible.  Eat seafood 2 or more times a week.  Have healthy snacks readily available, such as:  Vegetable sticks with hummus.  Greek yogurt.  Fruit and nut trail mix.  Eat balanced meals throughout the week. This includes:  Fruit: 2-3 servings a day  Vegetables: 4-5 servings a day  Low-fat dairy: 2 servings a day  Fish, poultry, or lean meat: 1 serving a day  Beans and legumes: 2 or more servings a week  Nuts and seeds: 1-2 servings a day  Whole grains: 6-8 servings a day  Extra-virgin olive oil: 3-4 servings a day  Limit red meat and sweets to only a few servings a month What are my food choices?  Mediterranean diet  Recommended  Grains: Whole-grain pasta. Brown rice. Bulgar wheat. Polenta. Couscous. Whole-wheat bread. [place].  Vegetables: Artichokes. Beets. Broccoli. Cabbage. Carrots. Eggplant. Green beans. Chard. Kale. Spinach. Onions. Leeks. Peas. Squash. Tomatoes. Peppers. Radishes.  Fruits: Apples. Apricots. Avocado. Berries. Bananas. Cherries. Dates. Figs. Grapes. Lemons. Melon. Oranges. Peaches. Plums. Pomegranate.  Meats and other protein foods: Beans. Almonds. Sunflower seeds. Pine nuts. Peanuts. [place]. Salmon. Scallops. Shrimp. [place]. Tilapia. Clams. Oysters. Eggs.  Dairy: Low-fat milk. Cheese. Greek yogurt.  Beverages: Water. Red wine. Herbal tea.  Fats and oils: Extra virgin olive oil. Avocado oil. Grape seed oil.  Sweets and desserts: [place] yogurt with honey. Baked apples. Poached pears. Trail mix.  Seasoning and other foods: Basil. Cilantro. Coriander. Cumin. Mint. Parsley. Sage. Rosemary. Tarragon. Garlic. Oregano. Thyme. Pepper. Balsalmic vinegar. Tahini. Hummus. Tomato sauce. Olives. Mushrooms.  Limit these  Grains: Prepackaged pasta or rice dishes. Prepackaged cereal with added sugar.  Vegetables: Deep fried potatoes (french  fries).  Fruits: Fruit canned in syrup.  Meats and other protein foods: Beef. Pork. Lamb. Poultry with skin. Hot dogs. Berniece Salines.  Dairy: Ice cream. Sour cream. Whole milk.  Beverages: Juice. Sugar-sweetened soft drinks. Beer. Liquor and spirits.  Fats and oils: Butter. Canola oil. Vegetable oil. Beef fat (tallow). Lard.  Sweets and desserts: Cookies. Cakes. Pies. Candy.  Seasoning and other foods: Mayonnaise. Premade sauces and marinades.  The items listed may not be a complete list. Talk with your dietitian about what dietary choices are right for you. Summary  The Mediterranean diet includes both food and lifestyle choices.  Eat a variety of fresh fruits and vegetables, beans, nuts, seeds, and whole grains.  Limit the amount of red meat and sweets that you eat.  Talk with your health care provider about whether it is safe for you to drink red wine in moderation. This means 1 glass a day for nonpregnant women and 2 glasses a day for men. A glass of wine equals 5 oz (150 mL). This information is not intended to replace advice given to you by your health care provider. Make sure you discuss any questions you have with your health care provider. Document Released: 11/03/2015 Document Revised: 12/06/2015 Document Reviewed: 11/03/2015 Elsevier Interactive Patient  Education  2017 Elsevier Inc.  

## 2016-04-05 NOTE — Progress Notes (Signed)
Subjective:    Patient ID: Emily Downs, female    DOB: 10-09-1980, 36 y.o.   MRN: [id number]  04/05/2016  Follow-up (HTN)   HPI This 36 y.o. female presents for two month hypertension follow-up.Started Methyldopa at last visit 250mg  bid.  Metoprolol XL 50mg  daily.  Suffered with headaches with first starting Methyldopa; headaches resolved; did not start Methyldopa until 03/26/2016.   Had a slight headache yesterday.  131/95, 135/92.  147/100, 146/110.  Same meter.    When got pregnant, continued Metoprolol and stopped HCTZ.  Not sleeping well at night; usually wakes up around 3:00am; up for 1-2 hours; gets up at 6:30am; does not get up 6:45-7:00am.    Immunization History  Administered Date(s) Administered  . Tdap 11/25/2006   BP Readings from Last 3 Encounters:  04/05/16 (!) 126/96  01/31/16 114/90  11/08/15 (!) 146/86   Wt Readings from Last 3 Encounters:  04/05/16 162 lb 6.4 oz (73.7 kg)  01/31/16 164 lb (74.4 kg)  11/08/15 165 lb (74.8 kg)    Review of Systems  Constitutional: Negative for chills, diaphoresis, fatigue and fever.  Eyes: Negative for visual disturbance.  Respiratory: Negative for cough and shortness of breath.   Cardiovascular: Negative for chest pain, palpitations and leg swelling.  Gastrointestinal: Negative for abdominal pain, constipation, diarrhea, nausea and vomiting.  Endocrine: Negative for cold intolerance, heat intolerance, polydipsia, polyphagia and polyuria.  Neurological: Positive for headaches. Negative for dizziness, tremors, seizures, syncope, facial asymmetry, speech difficulty, weakness, light-headedness and numbness.    Past Medical History:  Diagnosis Date  . Allergy   . Genital herpes   . Hypertension   . Iron deficiency anemia   . Menometrorrhagia    Past Surgical History:  Procedure Laterality Date  . DILATION AND EVACUATION  2009  . DILATION AND EVACUATION N/A 11/08/2015   Procedure: DILATATION AND EVACUATION WITH  CHROMASOME STUDIES;  Surgeon: Servando Salina, MD;  Location: [facility];  Service: Gynecology;  Laterality: N/A;   No Known Allergies  Social History   Social History  . Marital status: Married    Spouse name: N/A  . Number of children: N/A  . Years of education: N/A   Occupational History  . Not on file.   Social History Main Topics  . Smoking status: Never Smoker  . Smokeless tobacco: Never Used  . Alcohol use Yes     Comment: social  . Drug use: No  . Sexual activity: Yes    Birth control/ protection: Pill   Other Topics Concern  . Not on file   Social History Narrative   Marital status: married      Children: 50 yo daughter.      Lives: with husband, daughter.      Employment:  Hairdresser full time      Tobacco; none      Alcohol: rare      Drugs:  None      Exercise: none   Family History  Problem Relation Age of Onset  . Hypertension Mother   . Diabetes Mother   . Aneurysm Mother 40    brain aneurysm  . Hyperlipidemia Father   . Diabetes Maternal Grandmother   . Cancer Paternal Grandmother   . Crohn's disease Sister   . Hypertension Sister        Objective:    BP (!) 126/96 (BP Location: Right Arm, Cuff Size: Normal)   Pulse 71   Temp 97.5 F (36.4 C) (Oral)  Resp 16   Ht 5' (1.524 m)   Wt 162 lb 6.4 oz (73.7 kg)   LMP 12/28/2015   SpO2 99%   BMI 31.72 kg/m  Physical Exam  Constitutional: She is oriented to person, place, and time. She appears well-developed and well-nourished. No distress.  HENT:  Head: Normocephalic and atraumatic.  Right Ear: External ear normal.  Left Ear: External ear normal.  Nose: Nose normal.  Mouth/Throat: Oropharynx is clear and moist.  Eyes: Conjunctivae and EOM are normal. Pupils are equal, round, and reactive to light.  Neck: Normal range of motion. Neck supple. Carotid bruit is not present. No thyromegaly present.  Cardiovascular: Normal rate, regular rhythm, normal heart sounds and intact distal pulses.   Exam reveals no gallop and no friction rub.   No murmur heard. Pulmonary/Chest: Effort normal and breath sounds normal. She has no wheezes. She has no rales.  Abdominal: Soft. Bowel sounds are normal. She exhibits no distension and no mass. There is no tenderness. There is no rebound and no guarding.  Lymphadenopathy:    She has no cervical adenopathy.  Neurological: She is alert and oriented to person, place, and time. No cranial nerve deficit.  Skin: Skin is warm and dry. No rash noted. She is not diaphoretic. No erythema. No pallor.  Psychiatric: She has a normal mood and affect. Her behavior is normal.   Results for orders placed or performed during the hospital encounter of [id number]  Basic metabolic panel  Result Value Ref Range   Sodium 134 (L) 135 - 145 mmol/L   Potassium 3.9 3.5 - 5.1 mmol/L   Chloride 102 101 - 111 mmol/L   CO2 25 22 - 32 mmol/L   Glucose, Bld 93 65 - 99 mg/dL   BUN 12 6 - 20 mg/dL   Creatinine, Ser 0.84 0.44 - 1.00 mg/dL   Calcium 8.9 8.9 - 10.3 mg/dL   GFR calc non Af Amer >60 >60 mL/min   GFR calc Af Amer >60 >60 mL/min   Anion gap 7 5 - 15  CBC  Result Value Ref Range   WBC 8.0 4.0 - 10.5 K/uL   RBC 5.36 (H) 3.87 - 5.11 MIL/uL   Hemoglobin 12.8 12.0 - 15.0 g/dL   HCT 37.4 36.0 - 46.0 %   MCV 69.8 (L) 78.0 - 100.0 fL   MCH 23.9 (L) 26.0 - 34.0 pg   MCHC 34.2 30.0 - 36.0 g/dL   RDW 17.9 (H) 11.5 - 15.5 %   Platelets 339 150 - 400 K/uL  Chromosome STD, POC (Tissue)-[facility]  Result Value Ref Range   Chromosome Study, POC SEE SEPARATE REPORT   ABO/Rh  Result Value Ref Range   ABO/RH(D) B POS    Antibody Identification ANTI I    DAT, IgG NEG        Assessment & Plan:   1. Essential hypertension, benign    -increased Methyldopa to 250mg  tid; if diastolic still > 80, increase Methyldopa to 500mg  bid. -start exercising 5 days per week; also start utilizing https://www.matthews.info/   No orders of the defined types were placed in this encounter.  Meds  ordered this encounter  Medications  . methyldopa (ALDOMET) 500 MG tablet    Sig: Take 1 tablet (500 mg total) by mouth 2 (two) times daily.    Dispense:  60 tablet    Refill:  5    Return in about 3 months (around 07/04/2016) for recheck high blood pressure.   Lindey Renzulli Hassell Done  Tamala Julian, M.D. [facility] [address] [place], [place]  [phone] phone [phone] fax

## 2016-04-09 NOTE — Telephone Encounter (Signed)
Patient seen in office 04/05/16.

## 2016-07-03 ENCOUNTER — Ambulatory Visit: Payer: 59 | Admitting: Family Medicine

## 2016-07-16 ENCOUNTER — Ambulatory Visit (INDEPENDENT_AMBULATORY_CARE_PROVIDER_SITE_OTHER): Payer: 59 | Admitting: Physician Assistant

## 2016-07-16 VITALS — BP 129/84 | HR 86 | Temp 97.6°F | Resp 18 | Ht 60.0 in | Wt 167.0 lb

## 2016-07-16 DIAGNOSIS — R05 Cough: Secondary | ICD-10-CM

## 2016-07-16 DIAGNOSIS — R059 Cough, unspecified: Secondary | ICD-10-CM

## 2016-07-16 DIAGNOSIS — J301 Allergic rhinitis due to pollen: Secondary | ICD-10-CM

## 2016-07-16 MED ORDER — AZELASTINE HCL 0.15 % NA SOLN: 2.0000 | Freq: Two times a day (BID) | NASAL | 0 refills | Status: DC

## 2016-07-16 MED ORDER — HYDROCOD POLST-CPM POLST ER 10-8 MG/5ML PO SUER: 5.0000 mL | Freq: Every evening | ORAL | 0 refills | Status: DC | PRN

## 2016-07-16 MED ORDER — BENZONATATE 100 MG PO CAPS: 100.0000 mg | ORAL_CAPSULE | Freq: Three times a day (TID) | ORAL | 0 refills | Status: DC | PRN

## 2016-07-16 NOTE — Patient Instructions (Signed)
     IF you received an x-ray today, you will receive an invoice from [facility]. Please contact [facility] at [phone] with questions or concerns regarding your invoice.   IF you received labwork today, you will receive an invoice from [facility]. Please contact [facility] at [phone] with questions or concerns regarding your invoice.   Our billing staff will not be able to assist you with questions regarding bills from these companies.  You will be contacted with the lab results as soon as they are available. The fastest way to get your results is to activate your My Chart account. Instructions are located on the last page of this paperwork. If you have not heard from us regarding the results in 2 weeks, please contact this office.     

## 2016-07-16 NOTE — Progress Notes (Signed)
Patient ID: Emily Downs, female    DOB: 10-30-1980, 36 y.o.   MRN: [id number]  PCP: Reginia Forts, MD  Chief Complaint  Patient presents with  . Cough    x a week   . Laryngitis    Subjective:   Presents for evaluation of cough and laryngitis x 1 week. Feels like her seasonal allergies, but cetirizine and Flonase are not adequately controlling her symptoms.  Cough is productive of green sputum. It suddenly changed from clear until 1-2 days ago. Feels like she coughed up some "tissue," a clump of pink material. Has had occasional blood streaking in the sputum since then.  Nasal congestion is severe, also waxes and wanes.  No fever, chills, nausea, vomiting, no unexplained muscle/joint pain. Cough is keeping her awake at night. No wheezing. No headache or dizziness. Feels like she can't take a deep breath due to coughing. Some ear fullness. Sore throat.     Review of Systems As above.    Patient Active Problem List   Diagnosis Date Noted  . Seasonal allergic rhinitis due to pollen 09/19/2015  . Essential hypertension, benign 12/11/2013  . Menometrorrhagia   . Iron deficiency anemia      Prior to Admission medications   Medication Sig Start Date End Date Taking? Authorizing Provider  cetirizine (ZYRTEC) 10 MG tablet Take 10 mg by mouth daily.   Yes Historical Provider, MD  fluticasone (FLONASE) 50 MCG/ACT nasal spray Place 2 sprays into both nostrils daily. 07/26/15  Yes Wardell Honour, MD  ibuprofen (ADVIL,MOTRIN) 800 MG tablet Take 1 tablet (800 mg total) by mouth every 8 (eight) hours as needed for moderate pain. 11/08/15  Yes Servando Salina, MD  methyldopa (ALDOMET) 500 MG tablet Take 1 tablet (500 mg total) by mouth 2 (two) times daily. 04/05/16  Yes Wardell Honour, MD  metoprolol succinate (TOPROL-XL) 50 MG 24 hr tablet Take 1 tablet (50 mg total) by mouth daily. 03/23/16  Yes Wardell Honour, MD  Prenatal Multivit-Min-Fe-FA (PRENATAL VITAMINS) 0.8 MG  tablet Take 1 tablet by mouth daily. 07/05/14  Yes Wardell Honour, MD  meloxicam (MOBIC) 15 MG tablet TAKE 1 TABLET EVERY DAY Patient not taking: Reported on 07/16/2016 03/12/16   Wardell Honour, MD     No Known Allergies     Objective:  Physical Exam  Constitutional: She is oriented to person, place, and time. She appears well-developed and well-nourished. She is active and cooperative. No distress.  BP 129/84   Pulse 86   Temp 97.6 F (36.4 C) (Oral)   Resp 18   Ht 5' (1.524 m)   Wt 167 lb (75.8 kg)   LMP 07/05/2016   SpO2 100%   Breastfeeding? Unknown   BMI 32.61 kg/m    HENT:  Head: Normocephalic and atraumatic.  Right Ear: Hearing, tympanic membrane, external ear and ear canal normal.  Left Ear: Hearing, tympanic membrane, external ear and ear canal normal.  Nose: Mucosal edema and rhinorrhea (clear) present.  No foreign bodies. Right sinus exhibits no maxillary sinus tenderness and no frontal sinus tenderness. Left sinus exhibits no maxillary sinus tenderness and no frontal sinus tenderness.  Mouth/Throat: Uvula is midline, oropharynx is clear and moist and mucous membranes are normal. No uvula swelling. No oropharyngeal exudate.  Eyes: Conjunctivae and EOM are normal. Pupils are equal, round, and reactive to light. Right eye exhibits no discharge. Left eye exhibits no discharge. No scleral icterus.  Neck: Trachea normal, normal range  of motion and full passive range of motion without pain. Neck supple. No thyroid mass and no thyromegaly present.  Cardiovascular: Normal rate, regular rhythm and normal heart sounds.   Pulmonary/Chest: Effort normal and breath sounds normal.  Lymphadenopathy:       Head (right side): No submandibular, no tonsillar, no preauricular, no posterior auricular and no occipital adenopathy present.       Head (left side): No submandibular, no tonsillar, no preauricular and no occipital adenopathy present.    She has no cervical adenopathy.        Right: No supraclavicular adenopathy present.       Left: No supraclavicular adenopathy present.  Neurological: She is alert and oriented to person, place, and time. She has normal strength. No cranial nerve deficit or sensory deficit.  Skin: Skin is warm, dry and intact. No rash noted.  Psychiatric: She has a normal mood and affect. Her speech is normal and behavior is normal.           Assessment & Plan:   1. Cough Likely due to post-nasal drainage, secondary to #2. Use Tessalon during the day and Tussionex as needed at HS. Would consider oral steroids if symptoms persist, but elect to avoid initially due to HTN, which is controlled today. - benzonatate (TESSALON) 100 MG capsule; Take 1-2 capsules (100-200 mg total) by mouth 3 (three) times daily as needed for cough.  Dispense: 40 capsule; Refill: 0 - chlorpheniramine-HYDROcodone (TUSSIONEX PENNKINETIC ER) 10-8 MG/5ML SUER; Take 5 mLs by mouth at bedtime as needed for cough.  Dispense: 60 mL; Refill: 0  2. Seasonal allergic rhinitis due to pollen See above. Continue cetirizine and Flonase. - Azelastine HCl 0.15 % SOLN; Place 2 sprays into both nostrils 2 (two) times daily.  Dispense: 30 mL; Refill: 0    Return if symptoms worsen or fail to improve.   Fara Chute, PA-C [facility]

## 2016-07-31 ENCOUNTER — Ambulatory Visit (INDEPENDENT_AMBULATORY_CARE_PROVIDER_SITE_OTHER): Payer: 59 | Admitting: Family Medicine

## 2016-07-31 ENCOUNTER — Encounter: Payer: Self-pay | Admitting: Family Medicine

## 2016-07-31 VITALS — BP 128/88 | HR 74 | Temp 98.2°F | Resp 16 | Ht 60.5 in | Wt 162.6 lb

## 2016-07-31 DIAGNOSIS — N898 Other specified noninflammatory disorders of vagina: Secondary | ICD-10-CM | POA: Diagnosis not present

## 2016-07-31 DIAGNOSIS — Z131 Encounter for screening for diabetes mellitus: Secondary | ICD-10-CM | POA: Diagnosis not present

## 2016-07-31 DIAGNOSIS — I10 Essential (primary) hypertension: Secondary | ICD-10-CM

## 2016-07-31 DIAGNOSIS — Z23 Encounter for immunization: Secondary | ICD-10-CM

## 2016-07-31 DIAGNOSIS — Z01411 Encounter for gynecological examination (general) (routine) with abnormal findings: Secondary | ICD-10-CM

## 2016-07-31 DIAGNOSIS — Z0000 Encounter for general adult medical examination without abnormal findings: Secondary | ICD-10-CM | POA: Diagnosis not present

## 2016-07-31 DIAGNOSIS — Z124 Encounter for screening for malignant neoplasm of cervix: Secondary | ICD-10-CM | POA: Diagnosis not present

## 2016-07-31 DIAGNOSIS — J301 Allergic rhinitis due to pollen: Secondary | ICD-10-CM

## 2016-07-31 LAB — POCT URINALYSIS DIP (MANUAL ENTRY)
BILIRUBIN UA: NEGATIVE
GLUCOSE UA: NEGATIVE mg/dL
Ketones, POC UA: NEGATIVE mg/dL
Leukocytes, UA: NEGATIVE
Nitrite, UA: NEGATIVE
PH UA: 6.5 (ref 5.0–8.0)
RBC UA: NEGATIVE
SPEC GRAV UA: 1.01 (ref 1.010–1.025)
Urobilinogen, UA: 0.2 E.U./dL

## 2016-07-31 MED ORDER — PRENATAL VITAMINS 0.8 MG PO TABS: 1.0000 | ORAL_TABLET | Freq: Every day | ORAL | 3 refills | Status: DC

## 2016-07-31 MED ORDER — METOPROLOL SUCCINATE ER 50 MG PO TB24: 50.0000 mg | ORAL_TABLET | Freq: Every day | ORAL | 1 refills | Status: DC

## 2016-07-31 MED ORDER — METHYLDOPA 500 MG PO TABS: 500.0000 mg | ORAL_TABLET | Freq: Two times a day (BID) | ORAL | 1 refills | Status: DC

## 2016-07-31 NOTE — Progress Notes (Signed)
Subjective:    Patient ID: Emily Downs, female    DOB: Dec 30, 1980, 36 y.o.   MRN: [id number]  07/31/2016  Annual Exam   HPI This 36 y.o. female presents for Routine Physical Examination.  Last physical:  Not sure Pap smear:  09-29-2014 Cousins; follow-up not sure.  Regular; every 28 days.   Eye exam:  No glasses or contacts; 2017 Dental exam:  Every 3 months   Visual Acuity Screening   Right eye Left eye Both eyes  Without correction: 20/13 20/13 20/13   With correction:       Immunization History  Administered Date(s) Administered  . Tdap 11/25/2006, 07/31/2016   BP Readings from Last 3 Encounters:  07/31/16 128/88  07/16/16 129/84  04/05/16 (!) 126/96   Wt Readings from Last 3 Encounters:  07/31/16 162 lb 9.6 oz (73.8 kg)  07/16/16 167 lb (75.8 kg)  04/05/16 162 lb 6.4 oz (73.7 kg)    Review of Systems  Constitutional: Negative for activity change, appetite change, chills, diaphoresis, fatigue, fever and unexpected weight change.  HENT: Negative for congestion, dental problem, drooling, ear discharge, ear pain, facial swelling, hearing loss, mouth sores, nosebleeds, postnasal drip, rhinorrhea, sinus pressure, sneezing, sore throat, tinnitus, trouble swallowing and voice change.   Eyes: Negative for photophobia, pain, discharge, redness, itching and visual disturbance.  Respiratory: Negative for apnea, cough, choking, chest tightness, shortness of breath, wheezing and stridor.   Cardiovascular: Negative for chest pain, palpitations and leg swelling.  Gastrointestinal: Negative for abdominal distention, abdominal pain, anal bleeding, blood in stool, constipation, diarrhea, nausea, rectal pain and vomiting.  Endocrine: Negative for cold intolerance, heat intolerance, polydipsia, polyphagia and polyuria.  Genitourinary: Negative for decreased urine volume, difficulty urinating, dyspareunia, dysuria, enuresis, flank pain, frequency, genital sores, hematuria, menstrual  problem, pelvic pain, urgency, vaginal bleeding, vaginal discharge and vaginal pain.  Musculoskeletal: Negative for arthralgias, back pain, gait problem, joint swelling, myalgias, neck pain and neck stiffness.  Skin: Negative for color change, pallor, rash and wound.  Allergic/Immunologic: Negative for environmental allergies, food allergies and immunocompromised state.  Neurological: Negative for dizziness, tremors, seizures, syncope, facial asymmetry, speech difficulty, weakness, light-headedness, numbness and headaches.  Hematological: Negative for adenopathy. Does not bruise/bleed easily.  Psychiatric/Behavioral: Negative for agitation, behavioral problems, confusion, decreased concentration, dysphoric mood, hallucinations, self-injury, sleep disturbance and suicidal ideas. The patient is not nervous/anxious and is not hyperactive.     Past Medical History:  Diagnosis Date  . Allergy   . Genital herpes   . Hypertension   . Iron deficiency anemia   . Menometrorrhagia    Past Surgical History:  Procedure Laterality Date  . DILATION AND EVACUATION  2009  . DILATION AND EVACUATION N/A 11/08/2015   Procedure: DILATATION AND EVACUATION WITH CHROMASOME STUDIES;  Surgeon: Servando Salina, MD;  Location: [facility];  Service: Gynecology;  Laterality: N/A;   No Known Allergies  Social History   Social History  . Marital status: Married    Spouse name: Tashona Calk  . Number of children: 1  . Years of education: MBA   Occupational History  . hair stylist   . residence Charity fundraiser     [place] (04/23/2016)   Social History Main Topics  . Smoking status: Never Smoker  . Smokeless tobacco: Never Used  . Alcohol use Yes     Comment: social  . Drug use: No  . Sexual activity: Yes    Birth control/ protection: None   Other Topics Concern  . Not  on file   Social History Narrative   Marital status: married x 7 years      Children: 50 yo daughter.      Lives: with husband,  daughter.      Employment:  Theme park manager, Residence Charity fundraiser      Tobacco; none      Alcohol: rare to none in 2018      Drugs:  None      Exercise: none in 2018      Seatbelt: 100%   Family History  Problem Relation Age of Onset  . Hypertension Mother   . Diabetes Mother   . Aneurysm Mother 29    brain aneurysm  . Sarcoidosis Mother   . Hyperlipidemia Father   . Diabetes Maternal Grandmother   . Stroke Maternal Grandmother   . Cancer Paternal Grandmother   . Crohn's disease Sister   . Hypertension Sister   . Diabetes Sister        Objective:    BP 128/88   Pulse 74   Temp 98.2 F (36.8 C) (Oral)   Resp 16   Ht 5' 0.5" (1.537 m)   Wt 162 lb 9.6 oz (73.8 kg)   LMP 07/05/2016   SpO2 99%   BMI 31.23 kg/m  Physical Exam  Constitutional: She is oriented to person, place, and time. She appears well-developed and well-nourished. No distress.  HENT:  Head: Normocephalic and atraumatic.  Right Ear: External ear normal.  Left Ear: External ear normal.  Nose: Nose normal.  Mouth/Throat: Oropharynx is clear and moist.  Eyes: Conjunctivae and EOM are normal. Pupils are equal, round, and reactive to light.  Neck: Normal range of motion and full passive range of motion without pain. Neck supple. No JVD present. Carotid bruit is not present. No thyromegaly present.  Cardiovascular: Normal rate, regular rhythm and normal heart sounds.  Exam reveals no gallop and no friction rub.   No murmur heard. Pulmonary/Chest: Effort normal and breath sounds normal. She has no wheezes. She has no rales. Right breast exhibits no inverted nipple, no mass, no nipple discharge, no skin change and no tenderness. Left breast exhibits no inverted nipple, no mass, no nipple discharge, no skin change and no tenderness.  Abdominal: Soft. Bowel sounds are normal. She exhibits no distension and no mass. There is no tenderness. There is no rebound and no guarding.  Genitourinary: Vagina normal. There is  no rash, tenderness, lesion or injury on the right labia. There is no rash, tenderness, lesion or injury on the left labia.  Musculoskeletal:       Right shoulder: Normal.       Left shoulder: Normal.       Cervical back: Normal.  Lymphadenopathy:    She has no cervical adenopathy.  Neurological: She is alert and oriented to person, place, and time. She has normal reflexes. No cranial nerve deficit. She exhibits normal muscle tone. Coordination normal.  Skin: Skin is warm and dry. No rash noted. She is not diaphoretic. No erythema. No pallor.  Psychiatric: She has a normal mood and affect. Her behavior is normal. Judgment and thought content normal.  Nursing note and vitals reviewed.  Depression screen [facility] 2/9 07/31/2016 07/31/2016 07/16/2016 04/05/2016 01/31/2016  Decreased Interest 0 0 0 0 0  Down, Depressed, Hopeless 0 0 0 0 1  PHQ - 2 Score 0 0 0 0 1        Assessment & Plan:   1. Routine physical examination  2. Need for Tdap vaccination   3. Encounter for gynecological examination with abnormal finding   4. Vaginal discharge   5. Essential hypertension, benign   6. Seasonal allergic rhinitis due to pollen   7. Cervical cancer screening   8. Screening for diabetes mellitus    -anticipatory guidance provided --- exercise, weight loss, safe driving practices, safe sex practices. -obtain age appropriate screening labs and labs for chronic disease management. -obtain wet prep due to recent vaginal discharge.  -s/p TDAP    Orders Placed This Encounter  Procedures  . Wet Prep for [employer], Clue  . Tdap vaccine greater than or equal to 7yo IM  . CBC with Differential/Platelet  . Comprehensive metabolic panel    Order Specific Question:   Has the patient fasted?    Answer:   Yes  . Hemoglobin A1c  . Lipid panel    Order Specific Question:   Has the patient fasted?    Answer:   Yes  . TSH  . POCT urinalysis dipstick  . EKG 12-Lead   Meds ordered this encounter    Medications  . methyldopa (ALDOMET) 500 MG tablet    Sig: Take 1 tablet (500 mg total) by mouth 2 (two) times daily.    Dispense:  180 tablet    Refill:  1  . metoprolol succinate (TOPROL-XL) 50 MG 24 hr tablet    Sig: Take 1 tablet (50 mg total) by mouth daily.    Dispense:  90 tablet    Refill:  1  . Prenatal Multivit-Min-Fe-FA (PRENATAL VITAMINS) 0.8 MG tablet    Sig: Take 1 tablet by mouth daily.    Dispense:  100 tablet    Refill:  3    Return in about 6 months (around 01/31/2017) for recheck high blood pressure.   Narcissa Melder Elayne Guerin, M.D. Primary Care at [facility] previously [facility] [address] [place], [place]  [phone] phone [phone] fax

## 2016-07-31 NOTE — Patient Instructions (Addendum)
IF you received an x-ray today, you will receive an invoice from [facility]. Please contact [facility] at [phone] with questions or concerns regarding your invoice.   IF you received labwork today, you will receive an invoice from [facility]. Please contact [facility] at [phone] with questions or concerns regarding your invoice.   Our billing staff will not be able to assist you with questions regarding bills from these companies.  You will be contacted with the lab results as soon as they are available. The fastest way to get your results is to activate your My Chart account. Instructions are located on the last page of this paperwork. If you have not heard from [place] regarding the results in 2 weeks, please contact this office.    [facility]pe Preventive Care 18-39 Years, Female Preventive care refers to lifestyle choices and visits with your health care provider that can promote health and wellness. What does preventive care include?  A yearly physical exam. This is also called an annual well check.  Dental exams once or twice a year.  Routine eye exams. Ask your health care provider how often you should have your eyes checked.  Personal lifestyle choices, including:  Daily care of your teeth and gums.  Regular physical activity.  Eating a healthy diet.  Avoiding tobacco and drug use.  Limiting alcohol use.  Practicing safe sex.  Taking vitamin and mineral supplements as recommended by your health care provider. What happens during an annual well check? The services and screenings done by your health care provider during your annual well check will depend on your age, overall health, lifestyle risk factors, and family history of disease. Counseling  Your health care provider may ask you questions about your:  Alcohol use.  Tobacco use.  Drug use.  Emotional well-being.  Home and relationship well-being.  Sexual activity.  Eating  habits.  Work and work Statistician.  Method of birth control.  Menstrual cycle.  Pregnancy history. Screening  You may have the following tests or measurements:  Height, weight, and BMI.  Diabetes screening. This is done by checking your blood sugar (glucose) after you have not eaten for a while (fasting).  Blood pressure.  Lipid and cholesterol levels. These may be checked every 5 years starting at age 51.  Skin check.  Hepatitis C blood test.  Hepatitis B blood test.  Sexually transmitted disease (STD) testing.  BRCA-related cancer screening. This may be done if you have a family history of breast, ovarian, tubal, or peritoneal cancers.  Pelvic exam and Pap test. This may be done every 3 years starting at age 53. Starting at age 3, this may be done every 5 years if you have a Pap test in combination with an HPV test. Discuss your test results, treatment options, and if necessary, the need for more tests with your health care provider. Vaccines  Your health care provider may recommend certain vaccines, such as:  Influenza vaccine. This is recommended every year.  Tetanus, diphtheria, and acellular pertussis (Tdap, Td) vaccine. You may need a Td booster every 10 years.  Varicella vaccine. You may need this if you have not been vaccinated.  HPV vaccine. If you are 30 or younger, you may need three doses over 6 months.  Measles, mumps, and rubella (MMR) vaccine. You may need at least one dose of MMR. You may also need a second dose.  Pneumococcal 13-valent conjugate (PCV13) vaccine. You may need this if you have certain conditions  conditions and were not previously vaccinated.  Pneumococcal polysaccharide (PPSV23) vaccine. You may need one or two doses if you smoke cigarettes or if you have certain conditions.  Meningococcal vaccine. One dose is recommended if you are age 19-21 years and a first-year college student living in a residence hall, or if you have one of several  medical conditions. You may also need additional booster doses.  Hepatitis A vaccine. You may need this if you have certain conditions or if you travel or work in places where you may be exposed to hepatitis A.  Hepatitis B vaccine. You may need this if you have certain conditions or if you travel or work in places where you may be exposed to hepatitis B.  Haemophilus influenzae type b (Hib) vaccine. You may need this if you have certain risk factors. Talk to your health care provider about which screenings and vaccines you need and how often you need them. This information is not intended to replace advice given to you by your health care provider. Make sure you discuss any questions you have with your health care provider. Document Released: 05/08/2001 Document Revised: 11/30/2015 Document Reviewed: 01/11/2015 Elsevier Interactive Patient Education  2017 Elsevier Inc.  

## 2016-07-31 NOTE — Progress Notes (Signed)
   Subjective:    Patient ID: Emily Downs, female    DOB: Jul 17, 1980, 36 y.o.   MRN: [id number]  HPI    Review of Systems  Constitutional: Negative.   HENT: Positive for congestion, postnasal drip, rhinorrhea, sinus pressure and sneezing.   Eyes: Positive for itching.  Respiratory: Positive for cough.   Cardiovascular: Negative.   Gastrointestinal: Positive for constipation.  Endocrine: Negative.   Genitourinary: Positive for vaginal discharge.  Musculoskeletal: Positive for arthralgias.  Skin: Negative.   Allergic/Immunologic: Positive for environmental allergies.  Neurological: Negative.   Hematological: Negative.   Psychiatric/Behavioral: Negative.        Objective:   Physical Exam        Assessment & Plan:

## 2016-08-01 LAB — COMPREHENSIVE METABOLIC PANEL
A/G RATIO: 1.1 — AB (ref 1.2–2.2)
ALBUMIN: 3.9 g/dL (ref 3.5–5.5)
ALT: 27 IU/L (ref 0–32)
AST: 29 IU/L (ref 0–40)
Alkaline Phosphatase: 85 IU/L (ref 39–117)
BUN / CREAT RATIO: 11 (ref 9–23)
BUN: 10 mg/dL (ref 6–20)
Bilirubin Total: 0.3 mg/dL (ref 0.0–1.2)
CALCIUM: 9 mg/dL (ref 8.7–10.2)
CO2: 26 mmol/L (ref 18–29)
Chloride: 101 mmol/L (ref 96–106)
Creatinine, Ser: 0.9 mg/dL (ref 0.57–1.00)
GFR, EST AFRICAN AMERICAN: 94 mL/min/{1.73_m2} (ref >59)
GFR, EST NON AFRICAN AMERICAN: 82 mL/min/{1.73_m2} (ref >59)
GLOBULIN, TOTAL: 3.4 g/dL (ref 1.5–4.5)
Glucose: 83 mg/dL (ref 65–99)
Potassium: 4.1 mmol/L (ref 3.5–5.2)
SODIUM: 140 mmol/L (ref 134–144)
TOTAL PROTEIN: 7.3 g/dL (ref 6.0–8.5)

## 2016-08-01 LAB — CBC WITH DIFFERENTIAL/PLATELET
BASOS: 0 %
Basophils Absolute: 0 10*3/uL (ref 0.0–0.2)
EOS (ABSOLUTE): 0.1 10*3/uL (ref 0.0–0.4)
EOS: 2 %
HEMATOCRIT: 38.3 % (ref 34.0–46.6)
HEMOGLOBIN: 12.3 g/dL (ref 11.1–15.9)
IMMATURE GRANS (ABS): 0 10*3/uL (ref 0.0–0.1)
IMMATURE GRANULOCYTES: 0 %
LYMPHS: 30 %
Lymphocytes Absolute: 1.7 10*3/uL (ref 0.7–3.1)
MCH: 22.8 pg — ABNORMAL LOW (ref 26.6–33.0)
MCHC: 32.1 g/dL (ref 31.5–35.7)
MCV: 71 fL — ABNORMAL LOW (ref 79–97)
Monocytes Absolute: 0.3 10*3/uL (ref 0.1–0.9)
Monocytes: 5 %
NEUTROS ABS: 3.5 10*3/uL (ref 1.4–7.0)
NEUTROS PCT: 63 %
Platelets: 392 10*3/uL — ABNORMAL HIGH (ref 150–379)
RBC: 5.4 x10E6/uL — ABNORMAL HIGH (ref 3.77–5.28)
RDW: 17.3 % — ABNORMAL HIGH (ref 12.3–15.4)
WBC: 5.5 10*3/uL (ref 3.4–10.8)

## 2016-08-01 LAB — WET PREP FOR TRICH, YEAST, CLUE
Clue Cell Exam: NEGATIVE
TRICHOMONAS EXAM: NEGATIVE
Yeast Exam: NEGATIVE

## 2016-08-01 LAB — HEMOGLOBIN A1C
ESTIMATED AVERAGE GLUCOSE: 120 mg/dL
HEMOGLOBIN A1C: 5.8 % — AB (ref 4.8–5.6)

## 2016-08-01 LAB — LIPID PANEL
CHOLESTEROL TOTAL: 184 mg/dL (ref 100–199)
Chol/HDL Ratio: 2.7 ratio (ref 0.0–4.4)
HDL: 67 mg/dL (ref >39)
LDL Calculated: 101 mg/dL — ABNORMAL HIGH (ref 0–99)
Triglycerides: 78 mg/dL (ref 0–149)
VLDL Cholesterol Cal: 16 mg/dL (ref 5–40)

## 2016-08-01 LAB — TSH: TSH: 1.14 u[IU]/mL (ref 0.450–4.500)

## 2016-08-03 LAB — PAP IG, CT-NG NAA, HPV HIGH-RISK
CHLAMYDIA, NUC. ACID AMP: NEGATIVE
Gonococcus by Nucleic Acid Amp: NEGATIVE
HPV, HIGH-RISK: POSITIVE — AB
PAP Smear Comment: 0

## 2016-08-11 LAB — PAP IG AND HPV HIGH-RISK
HPV, high-risk: POSITIVE — AB
PAP Smear Comment: 0

## 2016-08-29 LAB — PAP IG, CT-NG NAA, HPV HIGH-RISK
HPV, HIGH-RISK: POSITIVE — AB
PAP Smear Comment: 0

## 2016-09-04 ENCOUNTER — Encounter: Payer: Self-pay | Admitting: Family Medicine

## 2017-02-01 NOTE — Telephone Encounter (Signed)
done

## 2017-02-05 ENCOUNTER — Encounter: Payer: Self-pay | Admitting: Family Medicine

## 2017-02-06 ENCOUNTER — Ambulatory Visit: Payer: 59 | Admitting: Family Medicine

## 2017-02-12 ENCOUNTER — Other Ambulatory Visit: Payer: Self-pay | Admitting: Family Medicine

## 2017-02-12 NOTE — Telephone Encounter (Signed)
Rx refill

## 2017-02-12 NOTE — Telephone Encounter (Signed)
Please advise 

## 2017-02-25 LAB — OB RESULTS CONSOLE GC/CHLAMYDIA
Chlamydia: NEGATIVE
Gonorrhea: NEGATIVE

## 2017-02-25 LAB — OB RESULTS CONSOLE ANTIBODY SCREEN: ANTIBODY SCREEN: NEGATIVE

## 2017-02-25 LAB — OB RESULTS CONSOLE ABO/RH: RH Type: POSITIVE

## 2017-02-25 LAB — OB RESULTS CONSOLE RUBELLA ANTIBODY, IGM: Rubella: IMMUNE

## 2017-02-25 LAB — OB RESULTS CONSOLE HIV ANTIBODY (ROUTINE TESTING): HIV: NONREACTIVE

## 2017-02-25 LAB — OB RESULTS CONSOLE RPR: RPR: NONREACTIVE

## 2017-02-25 LAB — OB RESULTS CONSOLE HEPATITIS B SURFACE ANTIGEN: Hepatitis B Surface Ag: NEGATIVE

## 2017-02-28 ENCOUNTER — Other Ambulatory Visit: Payer: Self-pay

## 2017-03-08 ENCOUNTER — Other Ambulatory Visit (HOSPITAL_COMMUNITY): Payer: Self-pay | Admitting: Obstetrics

## 2017-03-08 DIAGNOSIS — Z363 Encounter for antenatal screening for malformations: Secondary | ICD-10-CM

## 2017-03-12 ENCOUNTER — Encounter (HOSPITAL_COMMUNITY): Payer: Self-pay | Admitting: *Deleted

## 2017-03-14 ENCOUNTER — Encounter (HOSPITAL_COMMUNITY): Payer: Self-pay

## 2017-03-14 ENCOUNTER — Ambulatory Visit (HOSPITAL_COMMUNITY)
Admission: RE | Admit: 2017-03-14 | Discharge: 2017-03-14 | Disposition: A | Discharge status: Home / Self Care | Payer: Medicaid Other | Source: Ambulatory Visit | Attending: Obstetrics | Admitting: Obstetrics

## 2017-03-14 DIAGNOSIS — Z3A17 17 weeks gestation of pregnancy: Secondary | ICD-10-CM | POA: Insufficient documentation

## 2017-03-14 DIAGNOSIS — O3412 Maternal care for benign tumor of corpus uteri, second trimester: Secondary | ICD-10-CM | POA: Diagnosis not present

## 2017-03-14 DIAGNOSIS — O09522 Supervision of elderly multigravida, second trimester: Secondary | ICD-10-CM | POA: Insufficient documentation

## 2017-03-14 DIAGNOSIS — O99212 Obesity complicating pregnancy, second trimester: Secondary | ICD-10-CM | POA: Diagnosis not present

## 2017-03-14 DIAGNOSIS — O09892 Supervision of other high risk pregnancies, second trimester: Secondary | ICD-10-CM | POA: Diagnosis not present

## 2017-03-14 DIAGNOSIS — E669 Obesity, unspecified: Secondary | ICD-10-CM | POA: Diagnosis not present

## 2017-03-14 DIAGNOSIS — O09529 Supervision of elderly multigravida, unspecified trimester: Secondary | ICD-10-CM

## 2017-03-14 DIAGNOSIS — Z363 Encounter for antenatal screening for malformations: Secondary | ICD-10-CM | POA: Diagnosis present

## 2017-03-14 NOTE — Progress Notes (Signed)
Genetic Counseling  High-Risk Gestation Note  Appointment Date:  03/14/2017 Referred By: Jerelyn Charles, MD Date of Birth:  January 30, 1981 Partner:  Isabel Caprice   Pregnancy History: [id number] Estimated Date of Delivery: 08/22/17 Estimated Gestational Age: [redacted]w[redacted]d Attending: Renella Cunas, MD   Mrs. Emily Downs and her husband, Mr. Nidia Grogan, were seen for genetic counseling because of a maternal age of 36 y.o. and no results on NIPS (Panorama) due to uninformative SNP pattern.     In summary:  Discussed AMA and associated risk for fetal aneuploidy  Reviewed previous NIPS (Panorama) yielded no results due to uninformative DNA pattern  Reviewed various explanations including consanguinity, underlying aneuploidy, normal variation, and other possible unknown factors  Discussed options for screening  Quad screen- declined  NIPS to a different lab using different methodology- declined  Ultrasound- performed today; see separate report  Discussed diagnostic testing options  Amniocentesis- declined  Reviewed family history concerns  They were counseled regarding maternal age and the association with risk for chromosome conditions due to nondisjunction with aging of the ova.   We reviewed chromosomes, nondisjunction, and the associated 1 in 9 risk for fetal aneuploidy related to a maternal age of 36 y.o. at [redacted]w[redacted]d gestation.  They were counseled that the risk for aneuploidy decreases as gestational age increases, accounting for those pregnancies which spontaneously abort.  We specifically discussed Down syndrome (trisomy 63), trisomies 10 and 48, and sex chromosome aneuploidies (47,XXX and 47,XXY) including the common features and prognoses of each.   Ms. Keng previously had noninvasive prenatal screening (NIPs)/prenatal cell free DNA testing through her OB office. This screen, Panorama through [facility] laboratory, did not yield results due to an uninformative DNA pattern. We  reviewed the methodology of NIPS in general and the conditions for which it screens and specifically reviewed the SNP analysis technology of Panorama. We reviewed that there can be various explanations for uninformative DNA pattern including consanguinity, underlying chromosome difference, or normal variation due to chance. The couple reported no known consanguinity. We discussed the option of repeat NIPS through a different laboratory that uses counting methodology rather than SNP technology and reviewed benefits and limitations.   We reviewed additional available screening options including Quad screen and detailed ultrasound.  They were counseled that screening tests are used to modify a patient's a priori risk for aneuploidy, typically based on age. This estimate provides a pregnancy specific risk assessment. We reviewed the benefits and limitations of each option. Specifically, we discussed the conditions for which each test screens, the detection rates, and false positive rates of each. They were also counseled regarding diagnostic testing via amniocentesis. We reviewed the approximate 1 in [phone] risk for complications from amniocentesis, including spontaneous pregnancy loss. We discussed the possible results that the tests might provide including: positive, negative, unanticipated, and no result. Finally, they were counseled regarding the cost of each option and potential out of pocket expenses. After consideration of all the options, they elected ultrasound only and declined repeat NIPS to different laboratory and declined amniocentesis.   A complete ultrasound was performed today. The ultrasound report will be sent under separate cover. There were no visualized fetal anomalies or markers suggestive of aneuploidy, though anatomic survey was limited by gestational age. They understand/s that screening tests cannot rule out all birth defects or genetic syndromes. The patient was advised of this  limitation and states she still does not want additional testing at this time. Follow-up ultrasound was scheduled in 4 weeks to complete  fetal anatomic survey.   The couple declined detailed pedigree Architect and detailed family history review but reported that their family histories were noncontributory for birth defects, intellectual disability, and known genetic conditions. Without further information regarding the provided family history, an accurate genetic risk cannot be calculated. Further genetic counseling is warranted if more information is obtained.  Mrs. LILLYBETH TAL denied exposure to environmental toxins or chemical agents. She denied the use of alcohol, tobacco or street drugs. She denied significant viral illnesses during the course of her pregnancy. She reported a personal history of high blood pressure, for which she is currently treated with medication.   I counseled this couple regarding the above risks and available options.  The approximate face-to-face time with the genetic counselor was 35 minutes.  Chipper Oman, MS,  Certified Genetic Counselor 03/14/2017

## 2017-03-15 ENCOUNTER — Other Ambulatory Visit (HOSPITAL_COMMUNITY): Payer: Self-pay | Admitting: *Deleted

## 2017-03-15 DIAGNOSIS — IMO0002 Reserved for concepts with insufficient information to code with codable children: Secondary | ICD-10-CM

## 2017-03-15 DIAGNOSIS — Z0489 Encounter for examination and observation for other specified reasons: Secondary | ICD-10-CM

## 2017-03-26 NOTE — L&D Delivery Note (Signed)
Delivery Note Patient was noted to have deep variable decelerations with position change.  Cervix was found to be 10/10/+3.  She pushed with two contractions.  At 12:55 AM a viable female was delivered via Vaginal, Spontaneous (Presentation:LOA ).  APGAR: 6, 8; weight pending  .  There was a short and tight nuchal cord x2 that was unable to be reduced prior to delivery.  The baby was stunned immediately after delivery, so the cord was clamped and cut without a 60 second delay.  He was transferred to the warmer for warm - dry stimulation and bulb suctioning.  After the five minute Apgar, he was transferred to mom for skin to skin.    Placenta status: Spontaneous, in tact.   Cord: 3V  with the following complications: .  Cord pH: n/a  Anesthesia:  Epidural Episiotomy: None Lacerations: None Suture Repair: n/a Est. Blood Loss (mL): 300  Mom to postpartum.  Baby to Couplet care / Skin to Skin.  [place] 08/10/2017, 1:18 AM

## 2017-04-11 ENCOUNTER — Other Ambulatory Visit (HOSPITAL_COMMUNITY): Payer: Self-pay | Admitting: *Deleted

## 2017-04-11 ENCOUNTER — Ambulatory Visit (HOSPITAL_COMMUNITY)
Admission: RE | Admit: 2017-04-11 | Discharge: 2017-04-11 | Disposition: A | Discharge status: Home / Self Care | Payer: Medicaid Other | Source: Ambulatory Visit | Attending: Obstetrics | Admitting: Obstetrics

## 2017-04-11 ENCOUNTER — Other Ambulatory Visit (HOSPITAL_COMMUNITY): Payer: Self-pay | Admitting: Maternal and Fetal Medicine

## 2017-04-11 ENCOUNTER — Encounter (HOSPITAL_COMMUNITY): Payer: Self-pay

## 2017-04-11 DIAGNOSIS — O10019 Pre-existing essential hypertension complicating pregnancy, unspecified trimester: Secondary | ICD-10-CM

## 2017-04-11 DIAGNOSIS — Z362 Encounter for other antenatal screening follow-up: Secondary | ICD-10-CM | POA: Insufficient documentation

## 2017-04-11 DIAGNOSIS — O99212 Obesity complicating pregnancy, second trimester: Secondary | ICD-10-CM | POA: Diagnosis not present

## 2017-04-11 DIAGNOSIS — Z0489 Encounter for examination and observation for other specified reasons: Secondary | ICD-10-CM

## 2017-04-11 DIAGNOSIS — Z3689 Encounter for other specified antenatal screening: Secondary | ICD-10-CM | POA: Diagnosis not present

## 2017-04-11 DIAGNOSIS — IMO0002 Reserved for concepts with insufficient information to code with codable children: Secondary | ICD-10-CM

## 2017-04-11 DIAGNOSIS — O09522 Supervision of elderly multigravida, second trimester: Secondary | ICD-10-CM

## 2017-04-11 DIAGNOSIS — D259 Leiomyoma of uterus, unspecified: Secondary | ICD-10-CM | POA: Insufficient documentation

## 2017-04-11 DIAGNOSIS — O3412 Maternal care for benign tumor of corpus uteri, second trimester: Secondary | ICD-10-CM | POA: Diagnosis not present

## 2017-04-11 DIAGNOSIS — E669 Obesity, unspecified: Secondary | ICD-10-CM | POA: Diagnosis not present

## 2017-04-11 DIAGNOSIS — O10012 Pre-existing essential hypertension complicating pregnancy, second trimester: Secondary | ICD-10-CM | POA: Diagnosis not present

## 2017-04-11 DIAGNOSIS — Z3A21 21 weeks gestation of pregnancy: Secondary | ICD-10-CM | POA: Insufficient documentation

## 2017-05-04 ENCOUNTER — Other Ambulatory Visit: Payer: Self-pay | Admitting: Family Medicine

## 2017-05-09 ENCOUNTER — Ambulatory Visit (HOSPITAL_COMMUNITY)
Admission: RE | Admit: 2017-05-09 | Discharge: 2017-05-09 | Disposition: A | Discharge status: Home / Self Care | Payer: Medicaid Other | Source: Ambulatory Visit | Attending: Obstetrics | Admitting: Obstetrics

## 2017-05-09 ENCOUNTER — Encounter (HOSPITAL_COMMUNITY): Payer: Self-pay

## 2017-05-09 ENCOUNTER — Other Ambulatory Visit (HOSPITAL_COMMUNITY): Payer: Self-pay | Admitting: Obstetrics and Gynecology

## 2017-05-09 DIAGNOSIS — O99212 Obesity complicating pregnancy, second trimester: Secondary | ICD-10-CM

## 2017-05-09 DIAGNOSIS — O09522 Supervision of elderly multigravida, second trimester: Secondary | ICD-10-CM | POA: Diagnosis not present

## 2017-05-09 DIAGNOSIS — Z362 Encounter for other antenatal screening follow-up: Secondary | ICD-10-CM | POA: Diagnosis not present

## 2017-05-09 DIAGNOSIS — E669 Obesity, unspecified: Secondary | ICD-10-CM | POA: Insufficient documentation

## 2017-05-09 DIAGNOSIS — O10012 Pre-existing essential hypertension complicating pregnancy, second trimester: Secondary | ICD-10-CM

## 2017-05-09 DIAGNOSIS — Z3A25 25 weeks gestation of pregnancy: Secondary | ICD-10-CM | POA: Diagnosis not present

## 2017-05-30 DIAGNOSIS — I10 Essential (primary) hypertension: Secondary | ICD-10-CM | POA: Diagnosis not present

## 2017-06-05 ENCOUNTER — Encounter: Payer: Self-pay | Admitting: Physician Assistant

## 2017-06-05 ENCOUNTER — Other Ambulatory Visit: Payer: Self-pay

## 2017-06-05 ENCOUNTER — Ambulatory Visit (INDEPENDENT_AMBULATORY_CARE_PROVIDER_SITE_OTHER): Payer: BLUE CROSS/BLUE SHIELD | Admitting: Physician Assistant

## 2017-06-05 VITALS — BP 116/73 | HR 103 | Temp 99.2°F | Resp 20 | Ht 60.43 in | Wt 181.8 lb

## 2017-06-05 DIAGNOSIS — J029 Acute pharyngitis, unspecified: Secondary | ICD-10-CM | POA: Diagnosis not present

## 2017-06-05 DIAGNOSIS — Z20818 Contact with and (suspected) exposure to other bacterial communicable diseases: Secondary | ICD-10-CM | POA: Diagnosis not present

## 2017-06-05 LAB — POCT RAPID STREP A (OFFICE): Rapid Strep A Screen: NEGATIVE

## 2017-06-05 MED ORDER — AMOXICILLIN 875 MG PO TABS: 875.0000 mg | ORAL_TABLET | Freq: Two times a day (BID) | ORAL | 0 refills | Status: AC

## 2017-06-05 NOTE — Progress Notes (Signed)
06/05/2017 1:42 PM   DOB: April 26, 1980 / MRN: [id number]  SUBJECTIVE:  Emily Downs is a 37 y.o. female presenting for sore throat.  Associates fatigue and body aches. Her daughter tested positive for strep in the office yesterday. Patient prefers treatment over waiting on culture if strep is negative today.   She has No Known Allergies.   She  has a past medical history of Allergy, Genital herpes, Hypertension, Iron deficiency anemia, and Menometrorrhagia.    She  reports that  has never smoked. she has never used smokeless tobacco. She reports that she does not drink alcohol or use drugs. She  reports that she currently engages in sexual activity. She reports using the following method of birth control/protection: None. The patient  has a past surgical history that includes Dilation and evacuation (2009) and Dilation and evacuation (N/A, 11/08/2015).  Her family history includes Aneurysm (age of onset: 10) in her mother; Cancer in her paternal grandmother; Crohn's disease in her sister; Diabetes in her maternal grandmother, mother, and sister; Hyperlipidemia in her father; Hypertension in her mother and sister; Sarcoidosis in her mother; Stroke in her maternal grandmother.  Review of Systems  Constitutional: Positive for chills, fever and malaise/fatigue. Negative for diaphoresis.  Respiratory: Negative for shortness of breath.   Cardiovascular: Negative for chest pain, orthopnea and leg swelling.  Gastrointestinal: Negative for nausea.  Skin: Negative for rash.  Neurological: Negative for dizziness.    The problem list and medications were reviewed and updated by myself where necessary and exist elsewhere in the encounter.   OBJECTIVE:  BP 116/73 (BP Location: Right Arm, Patient Position: Sitting, Cuff Size: Normal)   Pulse (!) 103   Temp 99.2 F (37.3 C) (Oral)   Resp 20   Ht 5' 0.43" (1.535 m)   Wt 181 lb 12.8 oz (82.5 kg)   LMP 11/15/2016   SpO2 98%   BMI 35.00 kg/m     Wt Readings from Last 3 Encounters:  06/05/17 181 lb 12.8 oz (82.5 kg)  05/09/17 180 lb 9.6 oz (81.9 kg)  04/11/17 178 lb 6.4 oz (80.9 kg)   Temp Readings from Last 3 Encounters:  06/05/17 99.2 F (37.3 C) (Oral)  07/31/16 98.2 F (36.8 C) (Oral)  07/16/16 97.6 F (36.4 C) (Oral)   BP Readings from Last 3 Encounters:  06/05/17 116/73  05/09/17 120/76  04/11/17 124/77   Pulse Readings from Last 3 Encounters:  06/05/17 (!) 103  05/09/17 90  04/11/17 83     Physical Exam  Constitutional: She is active.  Non-toxic appearance.  HENT:  Nose: Nose normal.  Mouth/Throat: Oropharynx is clear and moist. No oropharyngeal exudate.  Cardiovascular: S1 normal and S2 normal. Tachycardia present. Exam reveals no gallop, no friction rub and no decreased pulses.  No murmur heard. Pulmonary/Chest: Effort normal. No stridor. No tachypnea. No respiratory distress. She has no wheezes. She has no rales.  Abdominal: She exhibits no distension.  Musculoskeletal: She exhibits no edema.  Neurological: She is alert.  Skin: Skin is warm and dry. She is not diaphoretic. No pallor.    Results for orders placed or performed in visit on 06/05/17 (from the past 72 hour(s))  POCT rapid strep A     Status: None   Collection Time: 06/05/17  1:39 PM  Result Value Ref Range   Rapid Strep A Screen Negative Negative    No results found.  ASSESSMENT AND PLAN:  Emily Downs was seen today for sore throat, generalized  body aches and cough.  Diagnoses and all orders for this visit:  Sore throat: Mildly tachycardic with low-grade fever and throat pain.  Her daughter tested positive for strep on a rapid test yesterday.  Patient is 7 months pregnant.  I am going to treat her with amoxicillin regardless of the rapid antigen test today.  I will call her if the test comes back negative so we can stop the amoxicillin. -     POCT rapid strep A  Exposure to strep throat -     Culture, Group A Strep  Other  orders -     amoxicillin (AMOXIL) 875 MG tablet; Take 1 tablet (875 mg total) by mouth 2 (two) times daily for 10 days.    The patient is advised to call or return to clinic if she does not see an improvement in symptoms, or to seek the care of the closest emergency department if she worsens with the above plan.   Philis Fendt, MHS, PA-C [facility] 06/05/2017 1:42 PM

## 2017-06-05 NOTE — Patient Instructions (Addendum)
I am going to treat you for strep regardless of rapid antigen testing in the office.  We will culture your throat and if you are negative on throat culture is reasonable to stop taking the antibiotic.  Amoxicillin is considered safe in pregnancy.  If the culture is negative will advise you to stop the antibiotic and continue taking Tylenol as needed for aches and pains.  Your symptoms and exam are not consistent with the flu.    IF you received an x-ray today, you will receive an invoice from [facility]. Please contact [facility] at [phone] with questions or concerns regarding your invoice.   IF you received labwork today, you will receive an invoice from [facility]. Please contact [facility] at [phone] with questions or concerns regarding your invoice.   Our billing staff will not be able to assist you with questions regarding bills from these companies.  You will be contacted with the lab results as soon as they are available. The fastest way to get your results is to activate your My Chart account. Instructions are located on the last page of this paperwork. If you have not heard from [place] regarding the results in 2 weeks, please contact this office.

## 2017-06-07 LAB — CULTURE, GROUP A STREP: Strep A Culture: NEGATIVE

## 2017-06-12 ENCOUNTER — Encounter: Payer: Medicaid Other | Attending: Obstetrics and Gynecology | Admitting: Registered"

## 2017-06-12 DIAGNOSIS — Z713 Dietary counseling and surveillance: Secondary | ICD-10-CM | POA: Diagnosis not present

## 2017-06-12 DIAGNOSIS — O24419 Gestational diabetes mellitus in pregnancy, unspecified control: Secondary | ICD-10-CM | POA: Diagnosis not present

## 2017-06-12 DIAGNOSIS — R7309 Other abnormal glucose: Secondary | ICD-10-CM

## 2017-06-18 ENCOUNTER — Encounter: Payer: Self-pay | Admitting: Registered"

## 2017-06-18 DIAGNOSIS — R7309 Other abnormal glucose: Secondary | ICD-10-CM | POA: Insufficient documentation

## 2017-06-18 NOTE — Progress Notes (Signed)
Patient was seen on 06/12/2017 for Gestational Diabetes self-management class at the [facility]. The following learning objectives were met by the patient during this course:   States the definition of Gestational Diabetes  States why dietary management is important in controlling blood glucose  Describes the effects each nutrient has on blood glucose levels  Demonstrates ability to create a balanced meal plan  Demonstrates carbohydrate counting   States when to check blood glucose levels  Demonstrates proper blood glucose monitoring techniques  States the effect of stress and exercise on blood glucose levels  States the importance of limiting caffeine and abstaining from alcohol and smoking  Blood glucose monitor given: Accu-Chek Lot # [id number] Exp: 06-13-2018 Blood glucose reading: 81  Patient instructed to monitor glucose levels: FBS: 60 - <95; 1 hour: <140; 2 hour: <120  Patient received handouts:  Nutrition Diabetes and Pregnancy, including carb counting list  Patient will be seen for follow-up as needed. 

## 2017-06-26 DIAGNOSIS — O24410 Gestational diabetes mellitus in pregnancy, diet controlled: Secondary | ICD-10-CM | POA: Diagnosis not present

## 2017-07-03 DIAGNOSIS — O26843 Uterine size-date discrepancy, third trimester: Secondary | ICD-10-CM | POA: Diagnosis not present

## 2017-07-03 DIAGNOSIS — I10 Essential (primary) hypertension: Secondary | ICD-10-CM | POA: Diagnosis not present

## 2017-07-03 DIAGNOSIS — Z23 Encounter for immunization: Secondary | ICD-10-CM | POA: Diagnosis not present

## 2017-07-11 DIAGNOSIS — O24419 Gestational diabetes mellitus in pregnancy, unspecified control: Secondary | ICD-10-CM | POA: Diagnosis not present

## 2017-07-11 DIAGNOSIS — I10 Essential (primary) hypertension: Secondary | ICD-10-CM | POA: Diagnosis not present

## 2017-07-15 DIAGNOSIS — O24419 Gestational diabetes mellitus in pregnancy, unspecified control: Secondary | ICD-10-CM | POA: Diagnosis not present

## 2017-07-18 DIAGNOSIS — O24419 Gestational diabetes mellitus in pregnancy, unspecified control: Secondary | ICD-10-CM | POA: Diagnosis not present

## 2017-07-18 DIAGNOSIS — Z3A35 35 weeks gestation of pregnancy: Secondary | ICD-10-CM | POA: Diagnosis not present

## 2017-07-18 DIAGNOSIS — Z36 Encounter for antenatal screening for chromosomal anomalies: Secondary | ICD-10-CM | POA: Diagnosis not present

## 2017-07-18 LAB — OB RESULTS CONSOLE GBS: STREP GROUP B AG: NEGATIVE

## 2017-07-22 DIAGNOSIS — O24419 Gestational diabetes mellitus in pregnancy, unspecified control: Secondary | ICD-10-CM | POA: Diagnosis not present

## 2017-07-25 DIAGNOSIS — O09523 Supervision of elderly multigravida, third trimester: Secondary | ICD-10-CM | POA: Diagnosis not present

## 2017-07-29 DIAGNOSIS — O26849 Uterine size-date discrepancy, unspecified trimester: Secondary | ICD-10-CM | POA: Diagnosis not present

## 2017-07-29 DIAGNOSIS — O24419 Gestational diabetes mellitus in pregnancy, unspecified control: Secondary | ICD-10-CM | POA: Diagnosis not present

## 2017-07-30 ENCOUNTER — Encounter (HOSPITAL_COMMUNITY): Payer: Self-pay | Admitting: *Deleted

## 2017-07-30 ENCOUNTER — Telehealth (HOSPITAL_COMMUNITY): Payer: Self-pay | Admitting: *Deleted

## 2017-07-30 NOTE — Telephone Encounter (Signed)
Preadmission screen  

## 2017-08-01 DIAGNOSIS — O24419 Gestational diabetes mellitus in pregnancy, unspecified control: Secondary | ICD-10-CM | POA: Diagnosis not present

## 2017-08-05 DIAGNOSIS — O24419 Gestational diabetes mellitus in pregnancy, unspecified control: Secondary | ICD-10-CM | POA: Diagnosis not present

## 2017-08-09 ENCOUNTER — Encounter (HOSPITAL_COMMUNITY): Payer: Self-pay

## 2017-08-09 ENCOUNTER — Inpatient Hospital Stay (HOSPITAL_COMMUNITY)
Admission: RE | Admit: 2017-08-09 | Discharge: 2017-08-11 | DRG: 806 | Disposition: A | Discharge status: Home / Self Care | Payer: Medicaid Other | Source: Ambulatory Visit | Attending: Obstetrics | Admitting: Obstetrics

## 2017-08-09 ENCOUNTER — Inpatient Hospital Stay (HOSPITAL_COMMUNITY): Payer: Medicaid Other | Admitting: Anesthesiology

## 2017-08-09 DIAGNOSIS — O9902 Anemia complicating childbirth: Secondary | ICD-10-CM | POA: Diagnosis present

## 2017-08-09 DIAGNOSIS — O1002 Pre-existing essential hypertension complicating childbirth: Principal | ICD-10-CM | POA: Diagnosis present

## 2017-08-09 DIAGNOSIS — D563 Thalassemia minor: Secondary | ICD-10-CM | POA: Diagnosis present

## 2017-08-09 DIAGNOSIS — D649 Anemia, unspecified: Secondary | ICD-10-CM | POA: Diagnosis present

## 2017-08-09 DIAGNOSIS — A6000 Herpesviral infection of urogenital system, unspecified: Secondary | ICD-10-CM | POA: Diagnosis present

## 2017-08-09 DIAGNOSIS — Z3A38 38 weeks gestation of pregnancy: Secondary | ICD-10-CM

## 2017-08-09 DIAGNOSIS — O24420 Gestational diabetes mellitus in childbirth, diet controlled: Secondary | ICD-10-CM | POA: Diagnosis present

## 2017-08-09 DIAGNOSIS — O9832 Other infections with a predominantly sexual mode of transmission complicating childbirth: Secondary | ICD-10-CM | POA: Diagnosis present

## 2017-08-09 DIAGNOSIS — O10013 Pre-existing essential hypertension complicating pregnancy, third trimester: Secondary | ICD-10-CM

## 2017-08-09 LAB — CBC
HCT: 36.7 % (ref 36.0–46.0)
HEMATOCRIT: 37 % (ref 36.0–46.0)
HEMOGLOBIN: 11.8 g/dL — AB (ref 12.0–15.0)
Hemoglobin: 11.7 g/dL — ABNORMAL LOW (ref 12.0–15.0)
MCH: 23 pg — ABNORMAL LOW (ref 26.0–34.0)
MCH: 22.9 pg — AB (ref 26.0–34.0)
MCHC: 31.9 g/dL (ref 30.0–36.0)
MCHC: 31.9 g/dL (ref 30.0–36.0)
MCV: 72 fL — ABNORMAL LOW (ref 78.0–100.0)
MCV: 72 fL — ABNORMAL LOW (ref 78.0–100.0)
Platelets: 220 10*3/uL (ref 150–400)
Platelets: 227 10*3/uL (ref 150–400)
RBC: 5.14 MIL/uL — ABNORMAL HIGH (ref 3.87–5.11)
RBC: 5.1 MIL/uL (ref 3.87–5.11)
RDW: 17.2 % — AB (ref 11.5–15.5)
RDW: 17.3 % — ABNORMAL HIGH (ref 11.5–15.5)
WBC: 6.8 10*3/uL (ref 4.0–10.5)
WBC: 6.9 10*3/uL (ref 4.0–10.5)

## 2017-08-09 LAB — COMPREHENSIVE METABOLIC PANEL
ALBUMIN: 2.9 g/dL — AB (ref 3.5–5.0)
ALK PHOS: 161 U/L — AB (ref 38–126)
ALT: 28 U/L (ref 14–54)
AST: 37 U/L (ref 15–41)
Anion gap: 10 (ref 5–15)
BILIRUBIN TOTAL: 0.5 mg/dL (ref 0.3–1.2)
BUN: 12 mg/dL (ref 6–20)
CALCIUM: 9.1 mg/dL (ref 8.9–10.3)
CO2: 21 mmol/L — ABNORMAL LOW (ref 22–32)
Chloride: 106 mmol/L (ref 101–111)
Creatinine, Ser: 0.79 mg/dL (ref 0.44–1.00)
GFR calc Af Amer: >60 mL/min (ref >60)
GLUCOSE: 80 mg/dL (ref 65–99)
Potassium: 3.9 mmol/L (ref 3.5–5.1)
SODIUM: 137 mmol/L (ref 135–145)
TOTAL PROTEIN: 6.3 g/dL — AB (ref 6.5–8.1)

## 2017-08-09 LAB — GLUCOSE, CAPILLARY
GLUCOSE-CAPILLARY: 82 mg/dL (ref 65–99)
Glucose-Capillary: 65 mg/dL (ref 65–99)
Glucose-Capillary: 71 mg/dL (ref 65–99)
Glucose-Capillary: 86 mg/dL (ref 65–99)

## 2017-08-09 LAB — TYPE AND SCREEN
ABO/RH(D): B POS
ANTIBODY SCREEN: NEGATIVE

## 2017-08-09 LAB — RPR: RPR Ser Ql: NONREACTIVE

## 2017-08-09 MED ORDER — DIPHENHYDRAMINE HCL 50 MG/ML IJ SOLN: 12.5000 mg | INTRAMUSCULAR | Status: DC | PRN

## 2017-08-09 MED ORDER — LACTATED RINGERS IV SOLN: 500.0000 mL | Freq: Once | INTRAVENOUS | Status: DC

## 2017-08-09 MED ORDER — TERBUTALINE SULFATE 1 MG/ML IJ SOLN
0.2500 mg | Freq: Once | INTRAMUSCULAR | Status: AC | PRN
  Administered 2017-08-09: 0.25 mg via SUBCUTANEOUS

## 2017-08-09 MED ORDER — FENTANYL 2.5 MCG/ML BUPIVACAINE 1/10 % EPIDURAL INFUSION (WH - ANES)
14.0000 mL/h | INTRAMUSCULAR | Status: DC | PRN
  Administered 2017-08-09: 14 mL/h via EPIDURAL
  Filled 2017-08-09: qty 100

## 2017-08-09 MED ORDER — PHENYLEPHRINE 40 MCG/ML (10ML) SYRINGE FOR IV PUSH (FOR BLOOD PRESSURE SUPPORT)
80.0000 ug | PREFILLED_SYRINGE | INTRAVENOUS | Status: DC | PRN
  Filled 2017-08-09: qty 5
  Filled 2017-08-09: qty 10

## 2017-08-09 MED ORDER — FENTANYL CITRATE (PF) 100 MCG/2ML IJ SOLN
50.0000 ug | INTRAMUSCULAR | Status: DC | PRN
  Administered 2017-08-09 (×2): 50 ug via INTRAVENOUS
  Filled 2017-08-09 (×3): qty 2

## 2017-08-09 MED ORDER — LIDOCAINE HCL (PF) 1 % IJ SOLN
INTRAMUSCULAR | Status: DC | PRN
  Administered 2017-08-09 (×2): 5 mL via EPIDURAL

## 2017-08-09 MED ORDER — LACTATED RINGERS IV SOLN
500.0000 mL | Freq: Once | INTRAVENOUS | Status: AC
  Administered 2017-08-09: 500 mL via INTRAVENOUS

## 2017-08-09 MED ORDER — LACTATED RINGERS IV SOLN
INTRAVENOUS | Status: DC
  Administered 2017-08-09 (×3): via INTRAVENOUS

## 2017-08-09 MED ORDER — PHENYLEPHRINE 40 MCG/ML (10ML) SYRINGE FOR IV PUSH (FOR BLOOD PRESSURE SUPPORT)
80.0000 ug | PREFILLED_SYRINGE | INTRAVENOUS | Status: DC | PRN
  Filled 2017-08-09: qty 5

## 2017-08-09 MED ORDER — OXYTOCIN 40 UNITS IN LACTATED RINGERS INFUSION - SIMPLE MED
1.0000 m[IU]/min | INTRAVENOUS | Status: DC
  Administered 2017-08-09: 2 m[IU]/min via INTRAVENOUS

## 2017-08-09 MED ORDER — ONDANSETRON HCL 4 MG/2ML IJ SOLN: 4.0000 mg | Freq: Four times a day (QID) | INTRAMUSCULAR | Status: DC | PRN

## 2017-08-09 MED ORDER — OXYCODONE-ACETAMINOPHEN 5-325 MG PO TABS: 2.0000 | ORAL_TABLET | ORAL | Status: DC | PRN

## 2017-08-09 MED ORDER — LIDOCAINE HCL (PF) 1 % IJ SOLN
30.0000 mL | INTRAMUSCULAR | Status: DC | PRN
  Filled 2017-08-09: qty 30

## 2017-08-09 MED ORDER — LACTATED RINGERS IV SOLN
500.0000 mL | INTRAVENOUS | Status: DC | PRN
  Administered 2017-08-09: 300 mL via INTRAVENOUS

## 2017-08-09 MED ORDER — EPHEDRINE 5 MG/ML INJ
10.0000 mg | INTRAVENOUS | Status: DC | PRN
  Filled 2017-08-09: qty 2

## 2017-08-09 MED ORDER — OXYTOCIN 40 UNITS IN LACTATED RINGERS INFUSION - SIMPLE MED: 2.5000 [IU]/h | INTRAVENOUS | Status: DC

## 2017-08-09 MED ORDER — SOD CITRATE-CITRIC ACID 500-334 MG/5ML PO SOLN
30.0000 mL | ORAL | Status: DC | PRN
  Filled 2017-08-09: qty 15

## 2017-08-09 MED ORDER — OXYTOCIN 40 UNITS IN LACTATED RINGERS INFUSION - SIMPLE MED
1.0000 m[IU]/min | INTRAVENOUS | Status: DC
  Administered 2017-08-09: 1 m[IU]/min via INTRAVENOUS
  Filled 2017-08-09: qty 1000

## 2017-08-09 MED ORDER — OXYTOCIN BOLUS FROM INFUSION
500.0000 mL | Freq: Once | INTRAVENOUS | Status: AC
  Administered 2017-08-10: 500 mL via INTRAVENOUS

## 2017-08-09 MED ORDER — MISOPROSTOL 25 MCG QUARTER TABLET
25.0000 ug | ORAL_TABLET | ORAL | Status: DC | PRN
  Administered 2017-08-09: 25 ug via VAGINAL
  Filled 2017-08-09 (×2): qty 1

## 2017-08-09 MED ORDER — TERBUTALINE SULFATE 1 MG/ML IJ SOLN
0.2500 mg | Freq: Once | INTRAMUSCULAR | Status: DC | PRN
  Filled 2017-08-09 (×2): qty 1

## 2017-08-09 MED ORDER — OXYCODONE-ACETAMINOPHEN 5-325 MG PO TABS: 1.0000 | ORAL_TABLET | ORAL | Status: DC | PRN

## 2017-08-09 MED ORDER — ACETAMINOPHEN 325 MG PO TABS: 650.0000 mg | ORAL_TABLET | ORAL | Status: DC | PRN

## 2017-08-09 NOTE — Progress Notes (Signed)
CTBS by RN at 1816 for recurrent deep variable decelerations.  Per nurse, patient with strong contractions lasting approximately 2-3 minutes.  Pitocin was discontinued and terbutaline administered prior to my arrival.  IVF bolus had also been administered.  Approximately 1 hour later, I rechecked the patient and she was 7/90/-2.  Contractions had spaced but intermittent variable vs late decelerations were noted.  Patient was repositioned and IUPC placed.   At this time, fetal status is reassuring  EFM: 150s, mod var, + accels, no decels Toco: q4 min, MVUs inadequate  Will restart pitocin

## 2017-08-09 NOTE — Anesthesia Pain Management Evaluation Note (Signed)
  CRNA Pain Management Visit Note  Patient: Emily Downs, 37 y.o., female  "Hello I am a member of the anesthesia team at [facility]. We have an anesthesia team available at all times to provide care throughout the hospital, including epidural management and anesthesia for C-section. I don't know your plan for the delivery whether it a natural birth, water birth, IV sedation, nitrous supplementation, doula or epidural, but we want to meet your pain goals."   1.Was your pain managed to your expectations on prior hospitalizations?   Yes   2.What is your expectation for pain management during this hospitalization?     Epidural and IV pain meds  3.How can we help you reach that goal? Be on call for epidural when ready   Record the patient's initial score and the patient's pain goal.   Pain: 0  Pain Goal: 8 [facility] wants you to be able to say your pain was always managed very well.  Kathie Rhodes 08/09/2017

## 2017-08-09 NOTE — Anesthesia Preprocedure Evaluation (Signed)
Anesthesia Evaluation  Patient identified by MRN, date of birth, ID band Patient awake    Reviewed: Allergy & Precautions, H&P , NPO status , Patient's Chart, lab work & pertinent test results  Airway Mallampati: II   Neck ROM: full    Dental   Pulmonary neg pulmonary ROS,    breath sounds clear to auscultation       Cardiovascular hypertension,  Rhythm:regular Rate:Normal     Neuro/Psych    GI/Hepatic   Endo/Other  diabetes  Renal/GU      Musculoskeletal   Abdominal   Peds  Hematology  (+) anemia ,   Anesthesia Other Findings   Reproductive/Obstetrics                             Anesthesia Physical Anesthesia Plan  ASA: II  Anesthesia Plan: Epidural   Post-op Pain Management:    Induction: Intravenous  PONV Risk Score and Plan: 2 and Treatment may vary due to age or medical condition  Airway Management Planned: Natural Airway  Additional Equipment:   Intra-op Plan:   Post-operative Plan:   Informed Consent: I have reviewed the patients History and Physical, chart, labs and discussed the procedure including the risks, benefits and alternatives for the proposed anesthesia with the patient or authorized representative who has indicated his/her understanding and acceptance.     Plan Discussed with: Anesthesiologist  Anesthesia Plan Comments:         Anesthesia Quick Evaluation

## 2017-08-09 NOTE — Progress Notes (Signed)
Comfortable w epidural  BP 128/85   Pulse 80   Temp 97.8 F (36.6 C) (Oral)   Resp 18   Ht 5' (1.524 m)   Wt 83 kg (183 lb)   LMP 11/15/2016   SpO2 100%   BMI 35.74 kg/m   Toco: q2-3 min, irregular EFM: 130s, mod var, +accels, occ var decel- SVE: 5/60/-2, AROM copious clear fluid  A&P: G3P1011 @ [redacted]w[redacted]d CHTN--BPs mild range on metoprolol / aldoment GMDA1--BG < 120 Cont pitocin--still in latent labor FSR/vtx/gbs neg

## 2017-08-09 NOTE — Anesthesia Procedure Notes (Signed)
Epidural Patient location during procedure: OB Start time: 08/09/2017 4:41 PM End time: 08/09/2017 4:51 PM  Staffing Anesthesiologist: Albertha Ghee, MD Performed: anesthesiologist   Preanesthetic Checklist Completed: patient identified, site marked, pre-op evaluation, timeout performed, IV checked, risks and benefits discussed and monitors and equipment checked  Epidural Patient position: sitting Prep: DuraPrep Patient monitoring: heart rate, cardiac monitor, continuous pulse ox and blood pressure Approach: midline Location: L2-L3 Injection technique: LOR saline  Needle:  Needle type: Tuohy  Needle gauge: 17 G Needle length: 9 cm Needle insertion depth: 6 cm Catheter type: closed end flexible Catheter size: 19 Gauge Catheter at skin depth: 12 cm Test dose: negative and Other  Assessment Events: blood not aspirated, injection not painful, no injection resistance and negative IV test  Additional Notes Informed consent obtained prior to proceeding including risk of failure, 1% risk of PDPH, risk of minor discomfort and bruising.  Discussed rare but serious complications including epidural abscess, permanent nerve injury, epidural hematoma.  Discussed alternatives to epidural analgesia and patient desires to proceed.  Timeout performed pre-procedure verifying patient name, procedure, and platelet count.  Patient tolerated procedure well. Reason for block:procedure for pain

## 2017-08-09 NOTE — H&P (Signed)
37 y.o. G3P1011 @ [redacted]w[redacted]d presents for IOL for CHTN at term.  Otherwise has good fetal movement and no bleeding.  Pregnancy c/b: 1. CHTN--on aldomet and metoprolol prior to pregnancy.  BPs have been well controlled without necessitating dose adjustment 2. GDMA1--EFW on 5/6--6lb 1oz (32%), DVP 6 3. Genital herpes--on suppressive valtrex--denies prodromal symptoms or lesions 4.  AMA--NIPS in early pregnancy with no result--saw genetic counseling and had anatomy scan (normal) at [facility].  Declined further genetic screening or diagnostic procedure  Past Medical History:  Diagnosis Date  . Allergy   . Beta thalassemia trait   . Genital herpes   . Gestational diabetes   . Hx of pre-eclampsia in prior pregnancy, currently pregnant   . Hypertension   . Iron deficiency anemia   . Menometrorrhagia     Past Surgical History:  Procedure Laterality Date  . DILATION AND EVACUATION  2009  . DILATION AND EVACUATION N/A 11/08/2015   Procedure: DILATATION AND EVACUATION WITH CHROMASOME STUDIES;  Surgeon: Servando Salina, MD;  Location: [facility];  Service: Gynecology;  Laterality: N/A;    OB History  Gravida Para Term Preterm AB Living  3 1 1   1 1   SAB TAB Ectopic Multiple Live Births  1       1    # Outcome Date GA Lbr Len/2nd Weight Sex Delivery Anes PTL Lv  3 Current           2 Term 2009 [redacted]w[redacted]d  2.75 kg (6 lb 1 oz) F Vag-Spont   LIV  1 SAB             Social History   Socioeconomic History  . Marital status: Married    Spouse name: Shadiamond Koska  . Number of children: 1  . Years of education: MBA  . Highest education level: Not on file  Occupational History  . Occupation: Probation officer  . Occupation: residence Charity fundraiser    Comment: [place] (04/23/2016)  Social Needs  . Financial resource strain: Not on file  . Food insecurity:    Worry: Not on file    Inability: Not on file  . Transportation needs:    Medical: Not on file    Non-medical: Not on file  Tobacco Use  . Smoking  status: Never Smoker  . Smokeless tobacco: Never Used  Substance and Sexual Activity  . Alcohol use: No    Frequency: Never  . Drug use: No  . Sexual activity: Yes    Birth control/protection: None  Other Topics Concern  . Not on file  Social History Narrative   Marital status: married x 7 years      Children: 72 yo daughter.      Lives: with husband, daughter.      Employment:  Theme park manager, Residence Charity fundraiser      Tobacco; none      Alcohol: rare to none in 2018      Drugs:  None      Exercise: none in 2018      Seatbelt: 100%   Patient has no known allergies.    Prenatal Transfer Tool  Maternal Diabetes: Yes:  Diabetes Type:  Diet controlled Genetic Screening: Abnormal:  Results: Other:  NIPS with no result--saw GC--declined further screening or amnio Maternal Ultrasounds/Referrals: Normal Fetal Ultrasounds or other Referrals:  None Maternal Substance Abuse:  No Significant Maternal Medications:  Meds include: Other:  aldomet, metoprolol XR, valtrex Significant Maternal Lab Results: Lab values include: Group B Strep  negative  ABO, Rh: --/--/B POS (05/17 2774) Antibody: NEG (05/17 0808) Rubella: Immune (12/03 0000) RPR: Nonreactive (12/03 0000)  HBsAg: Negative (12/03 0000)  HIV: Non-reactive (12/03 0000)  GBS: Negative (04/25 0000)     Vitals:   08/09/17 0755  BP: 133/88  Pulse: 87  Resp: 18  Temp: 97.8 F (36.6 C)     General:  NAD Abdomen:  soft, gravid  Ex:  no edema SVE:  Vulva / perineum / vagina / cervix without lesions. 1.5/long/high/posterior.  FB placed and filled with 60 cc fluid FHTs:  130s, mod var, +accels, cat 1 Toco:  quiet   A/P   29 y.o. G3P1011 [redacted]w[redacted]d presents with IOL for CHTN at term IOL--unfavorable cervix--FB/cytotec for ripening CHTN--cont home meds GDMA1--q4h FSBG in early labor, q2h in active labor HSV--no evidence of active lesions  FSR/ vtx/ GBS neg  [place]

## 2017-08-10 LAB — CBC
HCT: 33.6 % — ABNORMAL LOW (ref 36.0–46.0)
HEMOGLOBIN: 10.9 g/dL — AB (ref 12.0–15.0)
MCH: 23.1 pg — ABNORMAL LOW (ref 26.0–34.0)
MCHC: 32.4 g/dL (ref 30.0–36.0)
MCV: 71.2 fL — ABNORMAL LOW (ref 78.0–100.0)
PLATELETS: 195 10*3/uL (ref 150–400)
RBC: 4.72 MIL/uL (ref 3.87–5.11)
RDW: 17 % — ABNORMAL HIGH (ref 11.5–15.5)
WBC: 17.5 10*3/uL — ABNORMAL HIGH (ref 4.0–10.5)

## 2017-08-10 LAB — GLUCOSE, CAPILLARY: GLUCOSE-CAPILLARY: 95 mg/dL (ref 65–99)

## 2017-08-10 MED ORDER — SENNOSIDES-DOCUSATE SODIUM 8.6-50 MG PO TABS
2.0000 | ORAL_TABLET | ORAL | Status: DC
  Administered 2017-08-10: 2 via ORAL
  Filled 2017-08-10: qty 2

## 2017-08-10 MED ORDER — WITCH HAZEL-GLYCERIN EX PADS: 1.0000 "application " | MEDICATED_PAD | CUTANEOUS | Status: DC | PRN

## 2017-08-10 MED ORDER — OXYCODONE HCL 5 MG PO TABS: 5.0000 mg | ORAL_TABLET | ORAL | Status: DC | PRN

## 2017-08-10 MED ORDER — METHYLDOPA 500 MG PO TABS
500.0000 mg | ORAL_TABLET | Freq: Two times a day (BID) | ORAL | Status: DC
  Administered 2017-08-10: 500 mg via ORAL
  Filled 2017-08-10 (×4): qty 1

## 2017-08-10 MED ORDER — PRENATAL MULTIVITAMIN CH
1.0000 | ORAL_TABLET | Freq: Every day | ORAL | Status: DC
  Administered 2017-08-10: 1 via ORAL
  Filled 2017-08-10: qty 1

## 2017-08-10 MED ORDER — TETANUS-DIPHTH-ACELL PERTUSSIS 5-2.5-18.5 LF-MCG/0.5 IM SUSP: 0.5000 mL | Freq: Once | INTRAMUSCULAR | Status: DC

## 2017-08-10 MED ORDER — OXYCODONE HCL 5 MG PO TABS: 10.0000 mg | ORAL_TABLET | ORAL | Status: DC | PRN

## 2017-08-10 MED ORDER — DIPHENHYDRAMINE HCL 25 MG PO CAPS: 25.0000 mg | ORAL_CAPSULE | Freq: Four times a day (QID) | ORAL | Status: DC | PRN

## 2017-08-10 MED ORDER — BENZOCAINE-MENTHOL 20-0.5 % EX AERO: 1.0000 "application " | INHALATION_SPRAY | CUTANEOUS | Status: DC | PRN

## 2017-08-10 MED ORDER — ONDANSETRON HCL 4 MG/2ML IJ SOLN: 4.0000 mg | INTRAMUSCULAR | Status: DC | PRN

## 2017-08-10 MED ORDER — METHYLDOPA 500 MG PO TABS
500.0000 mg | ORAL_TABLET | Freq: Three times a day (TID) | ORAL | Status: DC
Start: 2017-08-10 — End: 2017-08-11
  Administered 2017-08-10 – 2017-08-11 (×2): 500 mg via ORAL
  Filled 2017-08-10 (×2): qty 1

## 2017-08-10 MED ORDER — METOPROLOL SUCCINATE ER 50 MG PO TB24
50.0000 mg | ORAL_TABLET | Freq: Every day | ORAL | Status: DC
  Administered 2017-08-10 – 2017-08-11 (×2): 50 mg via ORAL
  Filled 2017-08-10 (×3): qty 1

## 2017-08-10 MED ORDER — COCONUT OIL OIL: 1.0000 "application " | TOPICAL_OIL | Status: DC | PRN

## 2017-08-10 MED ORDER — DIBUCAINE 1 % RE OINT: 1.0000 "application " | TOPICAL_OINTMENT | RECTAL | Status: DC | PRN

## 2017-08-10 MED ORDER — METHYLDOPA 500 MG PO TABS
500.0000 mg | ORAL_TABLET | Freq: Once | ORAL | Status: AC
  Administered 2017-08-10: 500 mg via ORAL
  Filled 2017-08-10: qty 1

## 2017-08-10 MED ORDER — ACETAMINOPHEN 325 MG PO TABS
650.0000 mg | ORAL_TABLET | ORAL | Status: DC | PRN
  Administered 2017-08-10 (×2): 650 mg via ORAL
  Filled 2017-08-10 (×2): qty 2

## 2017-08-10 MED ORDER — FLUTICASONE PROPIONATE 50 MCG/ACT NA SUSP
2.0000 | Freq: Every day | NASAL | Status: DC
  Filled 2017-08-10: qty 16

## 2017-08-10 MED ORDER — SIMETHICONE 80 MG PO CHEW: 80.0000 mg | CHEWABLE_TABLET | ORAL | Status: DC | PRN

## 2017-08-10 MED ORDER — IBUPROFEN 600 MG PO TABS
600.0000 mg | ORAL_TABLET | Freq: Four times a day (QID) | ORAL | Status: DC
  Administered 2017-08-10 – 2017-08-11 (×5): 600 mg via ORAL
  Filled 2017-08-10 (×5): qty 1

## 2017-08-10 MED ORDER — ONDANSETRON HCL 4 MG PO TABS: 4.0000 mg | ORAL_TABLET | ORAL | Status: DC | PRN

## 2017-08-10 NOTE — Anesthesia Postprocedure Evaluation (Signed)
Anesthesia Post Note  Patient: Emily Downs  Procedure(s) Performed: AN [facility] [place]     Patient location during evaluation: Mother Baby Anesthesia Type: Epidural Level of consciousness: awake and alert Pain management: pain level controlled Vital Signs Assessment: post-procedure vital signs reviewed and stable Respiratory status: spontaneous breathing, nonlabored ventilation and respiratory function stable Cardiovascular status: stable Postop Assessment: no headache, no backache and epidural receding Anesthetic complications: no    Last Vitals:  Vitals:   08/10/17 0300 08/10/17 0400  BP: 137/87 (!) 141/81  Pulse: 82 72  Resp: 18   Temp: 37.2 C 37 C  SpO2:      Last Pain:  Vitals:   08/10/17 0400  TempSrc: Oral  PainSc: 2    Pain Goal:                 Kristyna Bradstreet

## 2017-08-10 NOTE — Lactation Note (Addendum)
This note was copied from a baby's chart. Lactation Consultation Note  Patient Name: Emily Downs [id number] Date: 08/10/2017 Reason for consult: Initial assessment;Early term 37-38.6wks(LC enc mom to call on her nurses light for feeding assess )  Mothers Meds - Aldomet L2 , Toprol XL -L2, Flonase - L3  Baby is 29 hours old  LC reviewed and updated the doc flow sheets per mom, for age WNL.  Presently baby asleep and per mom plans to get a nap.  Mother informed of post-discharge support and given phone number to the lactation department, including services for phone call assistance; out-patient appointments; and breastfeeding support group. List of other breastfeeding resources in the community given in the handout. Encouraged mother to call for problems or concerns related to breastfeeding.    Maternal Data Does the patient have breastfeeding experience prior to this delivery?: Yes  Feeding Feeding Type: (pe rmom baby last fed at 1152 - 12  mins ) Length of feed: 13 min  LATCH Score                   Interventions Interventions: Breast feeding basics reviewed  Lactation Tools Discussed/Used WIC Program: No   Consult Status Consult Status: Follow-up Date: 08/10/17 Follow-up type: In-patient    [place] 08/10/2017, 2:34 PM

## 2017-08-10 NOTE — Progress Notes (Signed)
Patient's B/P 155/92, asymptomatic. Notified Dr. Carlis Abbott. New orders written.

## 2017-08-10 NOTE — Progress Notes (Signed)
Notified Dr. Carlis Abbott about patient's recent blood pressure of 141/81. Dr. Carlis Abbott is aware that the patient has chronic hypertension and will be starting her two blood pressure medications this morning at 1000 that she has been taking at home. Dr. Carlis Abbott stated that she since she has chronic hypertension, she does not need to be notified when the patient's blood pressures are slightly elevated from the parameters in the order sets. The RN will continue to monitor the patient.

## 2017-08-10 NOTE — Progress Notes (Signed)
Patient is doing well.  She is ambulating, voiding, tolerating PO.  Pain control is good.  Lochia is appropriate  Vitals:   08/10/17 0230 08/10/17 0300 08/10/17 0400 08/10/17 0921  BP: 136/74 137/87 (!) 141/81 133/88  Pulse: 74 82 72 75  Resp:  18    Temp:  99 F (37.2 C) 98.6 F (37 C) 97.8 F (36.6 C)  TempSrc:  Oral Oral Oral  SpO2:    100%  Weight:      Height:        NAD Fundus firm Ext: trace edema b/l  Lab Results  Component Value Date   WBC 17.5 (H) 08/10/2017   HGB 10.9 (L) 08/10/2017   HCT 33.6 (L) 08/10/2017   MCV 71.2 (L) 08/10/2017   PLT 195 08/10/2017    --/--/B POS (05/17 0808)/RImmune  A/P 38 y.o. G3P1011 PPD#0. Routine care.   CHTN--bps mild range--cont aldomet / metoprolo--will monitor closely today GDMA1--FSBG fasting tomorrow Expect d/c tomorrow.    [place]

## 2017-08-10 NOTE — Progress Notes (Signed)
CSW received consult for EDPS score of 12. CSW met with mother of baby at bedside to offer support and complete assessment. CSW obtained permission from patient to discuss consult in front of family. Patient reports having a good support system from her family and FOB. Patient states her mood is tired but good. Patient denies suicidal or homicidal ideations. CSW provided education regarding the baby blues period vs. perinatal mood disorders. CSW provided review of Sudden Infant Death Syndrome (SIDS) precautions. CSW identifies no further need for intervention and no barriers to discharge at this time.  Madilyn Fireman, MSW, [place] Social Worker [facility] [phone]

## 2017-08-11 ENCOUNTER — Encounter (HOSPITAL_COMMUNITY): Payer: Self-pay | Admitting: *Deleted

## 2017-08-11 LAB — GLUCOSE, CAPILLARY: Glucose-Capillary: 68 mg/dL (ref 65–99)

## 2017-08-11 MED ORDER — METHYLDOPA 500 MG PO TABS: 500.0000 mg | ORAL_TABLET | Freq: Three times a day (TID) | ORAL | 1 refills | Status: DC

## 2017-08-11 MED ORDER — IBUPROFEN 600 MG PO TABS: 600.0000 mg | ORAL_TABLET | Freq: Four times a day (QID) | ORAL | 0 refills | Status: DC

## 2017-08-11 NOTE — Progress Notes (Signed)
Patient is doing well.  She is ambulating, voiding, tolerating PO.  Pain control is good.  Lochia is appropriate  BPs somewhat elevated yesterday in the 140-150s/90s.  Aldomet was increased to TID and have been mild range since.  She is asymptomatic.   Vitals:   08/10/17 1935 08/10/17 2010 08/10/17 2153 08/11/17 0530  BP: (!) 155/92 132/86 122/80 139/90  Pulse: 72 71  65  Resp:    18  Temp:  97.9 F (36.6 C)  97.6 F (36.4 C)  TempSrc:  Oral  Oral  SpO2:  100%    Weight:      Height:        NAD Fundus firm Ext: trace edema b/l  Lab Results  Component Value Date   WBC 17.5 (H) 08/10/2017   HGB 10.9 (L) 08/10/2017   HCT 33.6 (L) 08/10/2017   MCV 71.2 (L) 08/10/2017   PLT 195 08/10/2017    --/--/B POS (05/17 0808)/RImmune  A/P 37 y.o. G3P1011 PPD#1. Routine care.   CHTN--cont aldomet 500 TID and metoprolol XR 50mg  daily--will monitor BPs at home BID and call if >140/90 GDMA1--plan 2hr gtt postpartum Desires d/c to home today  Circ outpatient    [place]

## 2017-08-11 NOTE — Progress Notes (Signed)
Patient complains of sore and tender right nipple. Patient declined coconut oil stating she has some lanolin.   Patient requests being set up on the DEBP.  RN reviewed instructions for using the DEBP including setup, using and cleaning equipment. Patient verbalized understanding.  Patient was pumping on right side when RN left the room.

## 2017-08-11 NOTE — Lactation Note (Signed)
This note was copied from a baby's chart. Lactation Consultation Note  Patient Name: Emily Downs [id number] Date: 08/11/2017 Reason for consult: Follow-up assessment;Early term 37-38.6wks;Infant weight loss;Nipple pain/trauma  Baby is 62 hours old, 6% weight loss .  LC reviewed and updated the doc flow sheets per mom  Per  Mom I'm very tired due to being up all night with the baby and he probably isn't getting much from me.  LC recommended to mom once she gets home to try to rest / nap. Afterwards to offer breast and feed for 15 - 20 mins offer 2nd breast , of supplement EBM or formula until milk comes in and feed on the breast that is less sore.  Post pump to get the milk coming in quicker.  Sore nipple and engorgement prevention and tx reviewed. LC instructed mom on the use comfort gels , and shells.  Mother informed of post-discharge support and given phone number to the lactation department, including services for phone call assistance; out-patient appointments; and breastfeeding support group. List of other breastfeeding resources in the community given in the handout. Encouraged mother to call for problems or concerns related to breastfeeding.  LC unable to see a feeding due to baby being asleep and mom desiring to give a bottle due to baby being on her  Breast for 60 mins, and supplemented.     Maternal Data Has patient been taught Hand Expression?: Yes  Feeding Feeding Type: (pe rmom has been breastfeeding on and off for the last 60 mins and then got supplemented ) Nipple Type: Slow - flow Length of feed: 2 min  LATCH Score                   Interventions Interventions: Breast feeding basics reviewed;Shells;Comfort gels  Lactation Tools Discussed/Used Tools: Shells;Comfort gels Shell Type: Inverted Pump Review: Setup, frequency, and cleaning;Milk Storage(was set up by the RN )   Consult Status Consult Status: Complete Date: 08/11/17    [name] 08/11/2017, 10:26 AM

## 2017-08-11 NOTE — Discharge Instructions (Signed)
Pelvic rest x 6 weeks (no intercourse or tampons)   Call your doctor if you have heavy vaginal bleeding (soaking through a pad an hour or more for >2 hours in a row), temperature >101F, severe nausea, vomiting, severe or worsening abdominal pain, dizziness, shortness of breath, chest pain or any other concerns.  Please take your blood pressure twice daily and call if it is > 140/90

## 2017-08-11 NOTE — Discharge Summary (Signed)
Obstetric Discharge Summary Reason for Admission: induction of labor for chronic hypertension Prenatal Procedures: NST and ultrasound Intrapartum Procedures: spontaneous vaginal delivery Postpartum Procedures: none Complications-Operative and Postpartum: none Hemoglobin  Date Value Ref Range Status  08/10/2017 10.9 (L) 12.0 - 15.0 g/dL Final  07/31/2016 12.3 11.1 - 15.9 g/dL Final   HGB  Date Value Ref Range Status  03/09/2013 12.1 11.6 - 15.9 g/dL Final   HCT  Date Value Ref Range Status  08/10/2017 33.6 (L) 36.0 - 46.0 % Final  03/09/2013 36.9 34.8 - 46.6 % Final   Hematocrit  Date Value Ref Range Status  07/31/2016 38.3 34.0 - 46.6 % Final    Physical Exam:  General: alert, cooperative and appears stated age 8: appropriate Uterine Fundus: firm DVT Evaluation: No evidence of DVT seen on physical exam.  Discharge Diagnoses: Term Pregnancy-delivered  Discharge Information: Date: 08/11/2017 Activity: pelvic rest Diet: routine Medications: PNV, Ibuprofen and aldomet, metoprolol XR Condition: stable Instructions: refer to practice specific booklet Discharge to: home Follow-up Information    Jerelyn Charles, MD Follow up in 4 week(s).   Specialty:  Obstetrics Contact information: [place] [address] [phone]           Newborn Data: Live born female  Birth Weight: 6 lb 4.2 oz (2841 g) APGAR: 6, 8  Newborn Delivery   Time head delivered:  08/10/2017 00:54:00 Birth date/time:  08/10/2017 00:55:00 Delivery type:  Vaginal, Spontaneous     Home with mother.  [place] 08/11/2017, 8:52 AM

## 2017-08-11 NOTE — Progress Notes (Signed)
Patient requested supplementing formula with breastfeeding.  RN went over LEAD (1.Supplementing formula leads to lower milk supply, 2. Can lead to engorgement 3. Can lead to allergies/asthma 4. Can lead to depression in mother) with patient and patient still chooses to supplement with formula.  RN gave Similac 19 calorie to patient.  RN still encourages mother to continue breastfeeding and pumping.  Patient verbalized understanding.

## 2017-08-13 ENCOUNTER — Inpatient Hospital Stay (HOSPITAL_COMMUNITY): Payer: Medicaid Other

## 2017-08-13 ENCOUNTER — Inpatient Hospital Stay (HOSPITAL_COMMUNITY)
Admission: AD | Admit: 2017-08-13 | Discharge: 2017-08-15 | DRG: 776 | Disposition: A | Discharge status: Home / Self Care | Payer: Medicaid Other | Source: Ambulatory Visit | Attending: Obstetrics and Gynecology | Admitting: Obstetrics and Gynecology

## 2017-08-13 ENCOUNTER — Encounter (HOSPITAL_COMMUNITY): Payer: Self-pay

## 2017-08-13 ENCOUNTER — Other Ambulatory Visit: Payer: Self-pay

## 2017-08-13 DIAGNOSIS — O1093 Unspecified pre-existing hypertension complicating the puerperium: Secondary | ICD-10-CM | POA: Diagnosis not present

## 2017-08-13 DIAGNOSIS — O1425 HELLP syndrome, complicating the puerperium: Secondary | ICD-10-CM | POA: Diagnosis not present

## 2017-08-13 DIAGNOSIS — N939 Abnormal uterine and vaginal bleeding, unspecified: Secondary | ICD-10-CM | POA: Diagnosis not present

## 2017-08-13 DIAGNOSIS — O1003 Pre-existing essential hypertension complicating the puerperium: Secondary | ICD-10-CM | POA: Diagnosis present

## 2017-08-13 DIAGNOSIS — O115 Pre-existing hypertension with pre-eclampsia, complicating the puerperium: Secondary | ICD-10-CM | POA: Diagnosis not present

## 2017-08-13 LAB — COMPREHENSIVE METABOLIC PANEL
ALT: 104 U/L — AB (ref 14–54)
AST: 98 U/L — AB (ref 15–41)
Albumin: 2.6 g/dL — ABNORMAL LOW (ref 3.5–5.0)
Alkaline Phosphatase: 111 U/L (ref 38–126)
Anion gap: 10 (ref 5–15)
BUN: 16 mg/dL (ref 6–20)
CHLORIDE: 104 mmol/L (ref 101–111)
CO2: 24 mmol/L (ref 22–32)
CREATININE: 0.88 mg/dL (ref 0.44–1.00)
Calcium: 8.3 mg/dL — ABNORMAL LOW (ref 8.9–10.3)
GFR calc Af Amer: >60 mL/min (ref >60)
Glucose, Bld: 87 mg/dL (ref 65–99)
Potassium: 3.6 mmol/L (ref 3.5–5.1)
Sodium: 138 mmol/L (ref 135–145)
Total Bilirubin: 0.2 mg/dL — ABNORMAL LOW (ref 0.3–1.2)
Total Protein: 6.3 g/dL — ABNORMAL LOW (ref 6.5–8.1)

## 2017-08-13 LAB — CBC
HCT: 33 % — ABNORMAL LOW (ref 36.0–46.0)
Hemoglobin: 10.4 g/dL — ABNORMAL LOW (ref 12.0–15.0)
MCH: 22.9 pg — AB (ref 26.0–34.0)
MCHC: 31.5 g/dL (ref 30.0–36.0)
MCV: 72.5 fL — AB (ref 78.0–100.0)
PLATELETS: 303 10*3/uL (ref 150–400)
RBC: 4.55 MIL/uL (ref 3.87–5.11)
RDW: 17.2 % — ABNORMAL HIGH (ref 11.5–15.5)
WBC: 7.1 10*3/uL (ref 4.0–10.5)

## 2017-08-13 LAB — URINALYSIS, ROUTINE W REFLEX MICROSCOPIC
Bilirubin Urine: NEGATIVE
Glucose, UA: NEGATIVE mg/dL
KETONES UR: NEGATIVE mg/dL
NITRITE: NEGATIVE
Protein, ur: NEGATIVE mg/dL
SPECIFIC GRAVITY, URINE: 1.005 (ref 1.005–1.030)
pH: 7 (ref 5.0–8.0)

## 2017-08-13 LAB — PROTEIN / CREATININE RATIO, URINE
CREATININE, URINE: 36 mg/dL
Total Protein, Urine: <6 mg/dL

## 2017-08-13 MED ORDER — LABETALOL HCL 5 MG/ML IV SOLN
20.0000 mg | INTRAVENOUS | Status: AC | PRN
  Administered 2017-08-13: 80 mg via INTRAVENOUS
  Administered 2017-08-13: 40 mg via INTRAVENOUS
  Administered 2017-08-13: 20 mg via INTRAVENOUS
  Filled 2017-08-13: qty 4
  Filled 2017-08-13: qty 8
  Filled 2017-08-13: qty 16

## 2017-08-13 MED ORDER — MAGNESIUM SULFATE 40 G IN LACTATED RINGERS - SIMPLE
2.0000 g/h | INTRAVENOUS | Status: AC
  Administered 2017-08-13 – 2017-08-14 (×2): 2 g/h via INTRAVENOUS
  Filled 2017-08-13 (×2): qty 40

## 2017-08-13 MED ORDER — HYDRALAZINE HCL 20 MG/ML IJ SOLN
10.0000 mg | Freq: Once | INTRAMUSCULAR | Status: AC | PRN
  Administered 2017-08-13: 10 mg via INTRAVENOUS
  Filled 2017-08-13: qty 1

## 2017-08-13 MED ORDER — METOPROLOL SUCCINATE ER 50 MG PO TB24
50.0000 mg | ORAL_TABLET | Freq: Every day | ORAL | Status: DC
  Administered 2017-08-14 – 2017-08-15 (×2): 50 mg via ORAL
  Filled 2017-08-13 (×3): qty 1

## 2017-08-13 MED ORDER — LACTATED RINGERS IV SOLN
INTRAVENOUS | Status: DC
  Administered 2017-08-13 – 2017-08-14 (×3): via INTRAVENOUS

## 2017-08-13 MED ORDER — PRENATAL MULTIVITAMIN CH
1.0000 | ORAL_TABLET | Freq: Every day | ORAL | Status: DC
  Administered 2017-08-14: 1 via ORAL
  Filled 2017-08-13: qty 1

## 2017-08-13 MED ORDER — METHYLDOPA 500 MG PO TABS
500.0000 mg | ORAL_TABLET | Freq: Three times a day (TID) | ORAL | Status: DC
  Administered 2017-08-13 – 2017-08-15 (×5): 500 mg via ORAL
  Filled 2017-08-13 (×6): qty 1

## 2017-08-13 MED ORDER — MAGNESIUM SULFATE BOLUS VIA INFUSION
4.0000 g | Freq: Once | INTRAVENOUS | Status: AC
  Administered 2017-08-13: 4 g via INTRAVENOUS
  Filled 2017-08-13: qty 500

## 2017-08-13 NOTE — H&P (Signed)
Chief Complaint:Pt is a 37 y/o black female who had a SVD on 08/10/17, She was induced for chronic HTN. Her pregnancy was also complicated by AMA and GDM.She was on HTN meds throughout the preg. On admission her LFTs were wnl She was discharged to home on PPD#2. She called the office today stating that her b/Ps were elevated. She continued to take her HTN meds. She had elevated b/ps in the ER, some in the severe range. She also had an elevation of her LFTs. She will be admitted for MgSO4. HPI:  Past Medical History:  Diagnosis Date  . Allergy   . Beta thalassemia trait   . Genital herpes   . Gestational diabetes   . Hx of pre-eclampsia in prior pregnancy, currently pregnant   . Hypertension   . Iron deficiency anemia   . Menometrorrhagia     Past Surgical History:  Procedure Laterality Date  . DILATION AND EVACUATION  2009  . DILATION AND EVACUATION N/A 11/08/2015   Procedure: DILATATION AND EVACUATION WITH CHROMASOME STUDIES;  Surgeon: Servando Salina, MD;  Location: [facility];  Service: Gynecology;  Laterality: N/A;    Family History  Problem Relation Age of Onset  . Hypertension Mother   . Diabetes Mother   . Aneurysm Mother 52       brain aneurysm  . Sarcoidosis Mother   . Hyperlipidemia Father   . Diabetes Maternal Grandmother   . Stroke Maternal Grandmother   . Cancer Paternal Grandmother   . Crohn's disease Sister   . Hypertension Sister    Social History:  reports that she has never smoked. She has never used smokeless tobacco. She reports that she does not drink alcohol or use drugs.  Allergies: No Known Allergies  Medications Prior to Admission  Medication Sig Dispense Refill  . cetirizine (ZYRTEC) 10 MG tablet Take 10 mg by mouth daily.    . fluticasone (FLONASE) 50 MCG/ACT nasal spray Place 2 sprays into both nostrils daily. 16 g 11  . ibuprofen (ADVIL,MOTRIN) 600 MG tablet Take 1 tablet (600 mg total) by mouth every 6 (six) hours. 60 tablet 0  . methyldopa  (ALDOMET) 500 MG tablet Take 1 tablet (500 mg total) by mouth 3 (three) times daily. 90 tablet 1  . metoprolol succinate (TOPROL-XL) 50 MG 24 hr tablet TAKE 1 TABLET EVERY DAY 90 tablet 1  . Prenatal Multivit-Min-Fe-FA (PRENATAL VITAMINS) 0.8 MG tablet Take 1 tablet by mouth daily. 100 tablet 3       Blood pressure (!) 152/82, pulse 68, temperature 98.2 F (36.8 C), resp. rate 17, weight 176 lb 4 oz (79.9 kg), SpO2 98 %, currently breastfeeding. Exam per nurse pract in the ER   Lab Results  Component Value Date   WBC 7.1 08/13/2017   HGB 10.4 (L) 08/13/2017   HCT 33.0 (L) 08/13/2017   MCV 72.5 (L) 08/13/2017   PLT 303 08/13/2017   Lab Results  Component Value Date   PREGTESTUR Negative 05/20/2015      Patient Active Problem List   Diagnosis Date Noted  . Pre-eclampsia superimposed on chronic hypertension, postpartum 08/13/2017  . Benign essential hypertension affecting pregnancy in third trimester 08/09/2017  . Impaired glucose tolerance test 06/18/2017  . Advanced maternal age in multigravida 03/14/2017  . [redacted] weeks gestation of pregnancy   . Seasonal allergic rhinitis due to pollen 09/19/2015  . Essential hypertension, benign 12/11/2013  . Menometrorrhagia   . Iron deficiency anemia    IMP/ PP with HELLP.  Plan/ Admit with MgSO4          HTN meds as needed  Krasner,Zanna Hawn E 08/13/2017, 7:28 PM

## 2017-08-13 NOTE — MAU Note (Signed)
Pt states that she checked her bp at home and it was 150/106 around 1100 today.  Pt states that she is passing clots and she states they come out when she presses on her stomach.   She is also having abdominal pain rating 6/10 intermittent.    The office told her to come in and get checked out.

## 2017-08-13 NOTE — MAU Provider Note (Signed)
History     CSN: [id number]  Arrival date and time: 08/13/17 1449   First Provider Initiated Contact with Patient 08/13/17 1539      Chief Complaint  Patient presents with  . Hypertension  . Vaginal Bleeding  . Abdominal Pain   HPI  Emily Downs is a 37 y.o. [phone] female who presents with hypertension and vaginal bleeding. She is 3 days s/p SVD. Has chronic hypertension and takes aldomet & metoprolol. Last took medication this morning. Reports normal BPs throughout pregnancy and since arriving home until this morning. Took BP this morning and it was 150s/90s. Denies headache, visual disturbance, or epigastric pain. Also reports passing clots since being discharged home. States not bleeding much between passing clots. Clots have been from half dollar sized to softball sized. States when she presses on her abdomen clots will come out. Denies fever/chills.   OB History    Gravida  3   Para  2   Term  2   Preterm      AB  1   Living  2     SAB  1   TAB      Ectopic      Multiple      Live Births  2           Past Medical History:  Diagnosis Date  . Allergy   . Beta thalassemia trait   . Genital herpes   . Gestational diabetes   . Hx of pre-eclampsia in prior pregnancy, currently pregnant   . Hypertension   . Iron deficiency anemia   . Menometrorrhagia     Past Surgical History:  Procedure Laterality Date  . DILATION AND EVACUATION  2009  . DILATION AND EVACUATION N/A 11/08/2015   Procedure: DILATATION AND EVACUATION WITH CHROMASOME STUDIES;  Surgeon: Servando Salina, MD;  Location: [facility];  Service: Gynecology;  Laterality: N/A;    Family History  Problem Relation Age of Onset  . Hypertension Mother   . Diabetes Mother   . Aneurysm Mother 49       brain aneurysm  . Sarcoidosis Mother   . Hyperlipidemia Father   . Diabetes Maternal Grandmother   . Stroke Maternal Grandmother   . Cancer Paternal Grandmother   . Crohn's disease Sister    . Hypertension Sister     Social History   Tobacco Use  . Smoking status: Never Smoker  . Smokeless tobacco: Never Used  Substance Use Topics  . Alcohol use: No    Frequency: Never  . Drug use: No    Allergies: No Known Allergies  Medications Prior to Admission  Medication Sig Dispense Refill Last Dose  . cetirizine (ZYRTEC) 10 MG tablet Take 10 mg by mouth daily.   08/09/2017 at [place]  . fluticasone (FLONASE) 50 MCG/ACT nasal spray Place 2 sprays into both nostrils daily. 16 g 11 Feb at Unknown time  . ibuprofen (ADVIL,MOTRIN) 600 MG tablet Take 1 tablet (600 mg total) by mouth every 6 (six) hours. 60 tablet 0   . methyldopa (ALDOMET) 500 MG tablet Take 1 tablet (500 mg total) by mouth 3 (three) times daily. 90 tablet 1   . metoprolol succinate (TOPROL-XL) 50 MG 24 hr tablet TAKE 1 TABLET EVERY DAY 90 tablet 1 08/09/2017 at 0715  . Prenatal Multivit-Min-Fe-FA (PRENATAL VITAMINS) 0.8 MG tablet Take 1 tablet by mouth daily. 100 tablet 3 08/09/2017 at 0715    Review of Systems  Constitutional: Negative.   Eyes: Negative  for visual disturbance.  Respiratory: Negative for shortness of breath.   Cardiovascular: Negative for chest pain.  Gastrointestinal: Positive for abdominal pain. Negative for nausea and vomiting.  Genitourinary: Positive for vaginal bleeding.  Neurological: Negative for headaches.   Physical Exam   Blood pressure (!) 167/94, pulse 65, temperature 98.2 F (36.8 C), resp. rate 17, weight 176 lb 4 oz (79.9 kg), SpO2 98 %, currently breastfeeding. Patient Vitals for the past 24 hrs:  BP Temp Pulse Resp SpO2 Weight  08/13/17 1830 (!) 149/82 - 77 - - -  08/13/17 1808 134/79 - 77 - - -  08/13/17 1735 (!) 141/75 - 83 - - -  08/13/17 1716 (!) 165/86 - 68 - - -  08/13/17 1659 (!) 173/79 - (!) 58 - - -  08/13/17 1645 (!) 168/88 - 60 - - -  08/13/17 1631 (!) 175/93 - 60 - - -  08/13/17 1615 (!) 158/88 - - - - -  08/13/17 1601 (!) 171/104 - 68 - - -  08/13/17 1546  (!) 171/97 - 69 - - -  08/13/17 1535 (!) 167/94 - 65 - - -  08/13/17 1532 (!) 156/91 98.2 F (36.8 C) 78 17 98 % -  08/13/17 1513 - - - - - 176 lb 4 oz (79.9 kg)    Physical Exam  Nursing note and vitals reviewed. Constitutional: She is oriented to person, place, and time. She appears well-developed and well-nourished. No distress.  HENT:  Head: Normocephalic and atraumatic.  Eyes: Conjunctivae are normal. Right eye exhibits no discharge. Left eye exhibits no discharge. No scleral icterus.  Neck: Normal range of motion.  Cardiovascular: Normal rate, regular rhythm and normal heart sounds.  No murmur heard. Respiratory: Effort normal. No respiratory distress.  Neurological: She is alert and oriented to person, place, and time.  Skin: Skin is warm and dry. She is not diaphoretic.  Psychiatric: She has a normal mood and affect. Her behavior is normal. Judgment and thought content normal.    MAU Course  Procedures Results for orders placed or performed during the hospital encounter of 08/13/17 (from the past 24 hour(s))  Urinalysis, Routine w reflex microscopic     Status: Abnormal   Collection Time: 08/13/17  3:05 PM  Result Value Ref Range   Color, Urine STRAW (A) YELLOW   APPearance CLEAR CLEAR   Specific Gravity, Urine 1.005 1.005 - 1.030   pH 7.0 5.0 - 8.0   Glucose, UA NEGATIVE NEGATIVE mg/dL   Hgb urine dipstick LARGE (A) NEGATIVE   Bilirubin Urine NEGATIVE NEGATIVE   Ketones, ur NEGATIVE NEGATIVE mg/dL   Protein, ur NEGATIVE NEGATIVE mg/dL   Nitrite NEGATIVE NEGATIVE   Leukocytes, UA TRACE (A) NEGATIVE   RBC / HPF 0-5 0 - 5 RBC/hpf   WBC, UA 0-5 0 - 5 WBC/hpf   Bacteria, UA RARE (A) NONE SEEN   Squamous Epithelial / LPF 0-5 0 - 5  Protein / creatinine ratio, urine     Status: None   Collection Time: 08/13/17  3:05 PM  Result Value Ref Range   Creatinine, Urine 36.00 mg/dL   Total Protein, Urine <6 mg/dL   Protein Creatinine Ratio        0.00 - 0.15 mg/mg[Cre]  CBC      Status: Abnormal   Collection Time: 08/13/17  3:49 PM  Result Value Ref Range   WBC 7.1 4.0 - 10.5 K/uL   RBC 4.55 3.87 - 5.11 MIL/uL   Hemoglobin 10.4 (  L) 12.0 - 15.0 g/dL   HCT 33.0 (L) 36.0 - 46.0 %   MCV 72.5 (L) 78.0 - 100.0 fL   MCH 22.9 (L) 26.0 - 34.0 pg   MCHC 31.5 30.0 - 36.0 g/dL   RDW 17.2 (H) 11.5 - 15.5 %   Platelets 303 150 - 400 K/uL  Comprehensive metabolic panel     Status: Abnormal   Collection Time: 08/13/17  3:49 PM  Result Value Ref Range   Sodium 138 135 - 145 mmol/L   Potassium 3.6 3.5 - 5.1 mmol/L   Chloride 104 101 - 111 mmol/L   CO2 24 22 - 32 mmol/L   Glucose, Bld 87 65 - 99 mg/dL   BUN 16 6 - 20 mg/dL   Creatinine, Ser 0.88 0.44 - 1.00 mg/dL   Calcium 8.3 (L) 8.9 - 10.3 mg/dL   Total Protein 6.3 (L) 6.5 - 8.1 g/dL   Albumin 2.6 (L) 3.5 - 5.0 g/dL   AST 98 (H) 15 - 41 U/L   ALT 104 (H) 14 - 54 U/L   Alkaline Phosphatase 111 38 - 126 U/L   Total Bilirubin 0.2 (L) 0.3 - 1.2 mg/dL   GFR calc non Af Amer >60 >60 mL/min   GFR calc Af Amer >60 >60 mL/min   Anion gap 10 5 - 15   US Pelvis (transabdominal Only)  Result Date: 08/13/2017 CLINICAL DATA:  Postpartum vaginal bleeding EXAM: TRANSABDOMINAL ULTRASOUND OF PELVIS TECHNIQUE: Transabdominal ultrasound examination of the pelvis was performed including evaluation of the uterus, ovaries, adnexal regions, and pelvic cul-de-sac. COMPARISON:  None. FINDINGS: Uterus Measurements: Patient is 3 days post vaginal delivery. Uterus measures 20.5 x 9.9 x 14.7 cm. There is a focal leiomyoma anteriorly measuring 3.0 x 1.4 x 2.6 cm. No other focal uterine masses evident. Endometrium Thickness: 4 mm.  No focal abnormality visualized. Right ovary Measurements: 3.3 x 2.1 x 2.3 cm. Normal appearance/no adnexal mass. Left ovary Measurements: 2.8 x 1.9 x 2.1 cm. Normal appearance/no adnexal mass. Other findings:  No abnormal free fluid. IMPRESSION: Uterus enlarged, consistent with recent postpartum state. Focal leiomyoma  anteriorly measuring 3.0 x 1.4 x 2.6 cm. No other focal uterine mass evident. No endometrial thickening or inhomogeneity to the endometrial echogenicity. Ovaries appear normal bilaterally.  No free pelvic fluid evident. Electronically Signed   By: Lowella Grip III M.D.   On: 08/13/2017 18:22    MDM IV antihypertensive protocol ordered for severe range BPs  PEC labs ordered. Recommended cath u/a for PCR due to vaginal bleeding but patient refuses.  Discussed spec exam to evaluated bleeding & clots but patient declines.   Patient given 3 doses of labetalol (20, 40, 80) & 1 dose of hydral (10) with good BP response after hydral  CMP -- elevates LFTs compared to normal LFTs when admitted on 5/17  C/w Dr. Ouida Sills. Reviewed BPs, tx, labs, and ultrasound. Will admit to 24 hours of mag sulfate & to repeat LFTs tomorrow.  Assessment and Plan  A: 1. Pre-eclampsia superimposed on chronic hypertension, postpartum   2. Postpartum bleeding    P: Admit  Mag sulfate 4/2 CMP tomorrow morning Continue at home BP meds  Jorje Guild 08/13/2017, 3:39 PM

## 2017-08-14 LAB — COMPREHENSIVE METABOLIC PANEL
ALK PHOS: 99 U/L (ref 38–126)
ALT: 89 U/L — AB (ref 14–54)
AST: 72 U/L — ABNORMAL HIGH (ref 15–41)
Albumin: 2.6 g/dL — ABNORMAL LOW (ref 3.5–5.0)
Anion gap: 11 (ref 5–15)
BILIRUBIN TOTAL: 0.2 mg/dL — AB (ref 0.3–1.2)
BUN: 11 mg/dL (ref 6–20)
CALCIUM: 7.3 mg/dL — AB (ref 8.9–10.3)
CO2: 25 mmol/L (ref 22–32)
CREATININE: 0.92 mg/dL (ref 0.44–1.00)
Chloride: 103 mmol/L (ref 101–111)
GFR calc Af Amer: >60 mL/min (ref >60)
GFR calc non Af Amer: >60 mL/min (ref >60)
GLUCOSE: 97 mg/dL (ref 65–99)
Potassium: 3.4 mmol/L — ABNORMAL LOW (ref 3.5–5.1)
SODIUM: 139 mmol/L (ref 135–145)
TOTAL PROTEIN: 6.2 g/dL — AB (ref 6.5–8.1)

## 2017-08-14 MED ORDER — ACETAMINOPHEN 325 MG PO TABS
650.0000 mg | ORAL_TABLET | ORAL | Status: DC | PRN
  Administered 2017-08-14 (×2): 650 mg via ORAL
  Filled 2017-08-14 (×2): qty 2

## 2017-08-14 NOTE — Progress Notes (Signed)
Patient ID: Emily Downs, female   DOB: Apr 14, 1980, 37 y.o.   MRN: [id number]  HD#2, readmit for PP Pre-E/HELLP  S: Pt feeling well, IV in left arm in antecub and causing discomfort. Pt left handed and is limited in movement. Sad/frustrated to be separated from baby. O:  Vitals:   08/14/17 0500 08/14/17 0600 08/14/17 0655 08/14/17 0810  BP:    119/80  Pulse:    74  Resp: 16 16 16 18   Temp:    98.2 F (36.8 C)  TempSrc:      SpO2:    100%  Weight:      Height:       AOX3, NAD Abd soft   Results for orders placed or performed during the hospital encounter of 08/13/17 (from the past 24 hour(s))  Urinalysis, Routine w reflex microscopic     Status: Abnormal   Collection Time: 08/13/17  3:05 PM  Result Value Ref Range   Color, Urine STRAW (A) YELLOW   APPearance CLEAR CLEAR   Specific Gravity, Urine 1.005 1.005 - 1.030   pH 7.0 5.0 - 8.0   Glucose, UA NEGATIVE NEGATIVE mg/dL   Hgb urine dipstick LARGE (A) NEGATIVE   Bilirubin Urine NEGATIVE NEGATIVE   Ketones, ur NEGATIVE NEGATIVE mg/dL   Protein, ur NEGATIVE NEGATIVE mg/dL   Nitrite NEGATIVE NEGATIVE   Leukocytes, UA TRACE (A) NEGATIVE   RBC / HPF 0-5 0 - 5 RBC/hpf   WBC, UA 0-5 0 - 5 WBC/hpf   Bacteria, UA RARE (A) NONE SEEN   Squamous Epithelial / LPF 0-5 0 - 5  Protein / creatinine ratio, urine     Status: None   Collection Time: 08/13/17  3:05 PM  Result Value Ref Range   Creatinine, Urine 36.00 mg/dL   Total Protein, Urine <6 mg/dL   Protein Creatinine Ratio        0.00 - 0.15 mg/mg[Cre]  CBC     Status: Abnormal   Collection Time: 08/13/17  3:49 PM  Result Value Ref Range   WBC 7.1 4.0 - 10.5 K/uL   RBC 4.55 3.87 - 5.11 MIL/uL   Hemoglobin 10.4 (L) 12.0 - 15.0 g/dL   HCT 33.0 (L) 36.0 - 46.0 %   MCV 72.5 (L) 78.0 - 100.0 fL   MCH 22.9 (L) 26.0 - 34.0 pg   MCHC 31.5 30.0 - 36.0 g/dL   RDW 17.2 (H) 11.5 - 15.5 %   Platelets 303 150 - 400 K/uL  Comprehensive metabolic panel     Status: Abnormal   Collection Time: 08/13/17  3:49 PM  Result Value Ref Range   Sodium 138 135 - 145 mmol/L   Potassium 3.6 3.5 - 5.1 mmol/L   Chloride 104 101 - 111 mmol/L   CO2 24 22 - 32 mmol/L   Glucose, Bld 87 65 - 99 mg/dL   BUN 16 6 - 20 mg/dL   Creatinine, Ser 0.88 0.44 - 1.00 mg/dL   Calcium 8.3 (L) 8.9 - 10.3 mg/dL   Total Protein 6.3 (L) 6.5 - 8.1 g/dL   Albumin 2.6 (L) 3.5 - 5.0 g/dL   AST 98 (H) 15 - 41 U/L   ALT 104 (H) 14 - 54 U/L   Alkaline Phosphatase 111 38 - 126 U/L   Total Bilirubin 0.2 (L) 0.3 - 1.2 mg/dL   GFR calc non Af Amer >60 >60 mL/min   GFR calc Af Amer >60 >60 mL/min   Anion gap 10 5 -  15  Comprehensive metabolic panel     Status: Abnormal   Collection Time: 08/14/17  6:04 AM  Result Value Ref Range   Sodium 139 135 - 145 mmol/L   Potassium 3.4 (L) 3.5 - 5.1 mmol/L   Chloride 103 101 - 111 mmol/L   CO2 25 22 - 32 mmol/L   Glucose, Bld 97 65 - 99 mg/dL   BUN 11 6 - 20 mg/dL   Creatinine, Ser 0.92 0.44 - 1.00 mg/dL   Calcium 7.3 (L) 8.9 - 10.3 mg/dL   Total Protein 6.2 (L) 6.5 - 8.1 g/dL   Albumin 2.6 (L) 3.5 - 5.0 g/dL   AST 72 (H) 15 - 41 U/L   ALT 89 (H) 14 - 54 U/L   Alkaline Phosphatase 99 38 - 126 U/L   Total Bilirubin 0.2 (L) 0.3 - 1.2 mg/dL   GFR calc non Af Amer >60 >60 mL/min   GFR calc Af Amer >60 >60 mL/min   Anion gap 11 5 - 15   A/P 1) Continue Mag for 24 hrs total 2) Interval improvement in LFTS 3) Will attempt to move IV to Right arm. On admission RN could not find vein in right. Now that pts hydration status improved may have better luck. 4) Continue antihypertensive meds. No additional meds have been required since last night

## 2017-08-15 ENCOUNTER — Encounter: Payer: Self-pay | Admitting: Family Medicine

## 2017-08-15 MED ORDER — METHYLDOPA 500 MG PO TABS: 500.0000 mg | ORAL_TABLET | Freq: Three times a day (TID) | ORAL | 4 refills | Status: DC

## 2017-08-15 MED ORDER — METOPROLOL SUCCINATE ER 50 MG PO TB24: 50.0000 mg | ORAL_TABLET | Freq: Every day | ORAL | 3 refills | Status: DC

## 2017-08-15 NOTE — Progress Notes (Signed)
Patient is eating, ambulating, voiding.  Pain control is good.  Vitals:   08/14/17 2025 08/15/17 0017 08/15/17 0402 08/15/17 0746  BP: (!) 147/95 (!) 148/93 139/84 (!) 147/91  Pulse: 75 75 69 69  Resp: 17 16 20 18   Temp: 98.4 F (36.9 C) 98.4 F (36.9 C) 98.5 F (36.9 C) 98.6 F (37 C)  TempSrc:   Oral Oral  SpO2: 100% 100% 100% 100%  Weight:      Height:        Fundus firm Perineum without swelling.  Lab Results  Component Value Date   WBC 7.1 08/13/2017   HGB 10.4 (L) 08/13/2017   HCT 33.0 (L) 08/13/2017   MCV 72.5 (L) 08/13/2017   PLT 303 08/13/2017      Downs/P Post partum day 5 readmitted for PP preeclampsia/HELLP and magnesium sulfate prophylaxis.  BP control is excellent now on methyldopa and toprol.  Routine care otherwise.  Expect d/c today.    Emily Downs

## 2017-08-15 NOTE — Discharge Summary (Signed)
Physician Discharge Summary  Patient ID: Emily Downs MRN: [id number] DOB/AGE: May 27, 1980 37 y.o.  Admit date: 08/13/2017 Discharge date: 08/15/2017  Admission Diagnoses:Postpartum HELLP  Discharge Diagnoses:  Active Problems:   Pre-eclampsia superimposed on chronic hypertension, postpartum   Discharged Condition: good  Hospital Course: Uncomplicated course of prophylactic magnesium sulfate.  LFTs stabilized.  BPs stabilized on meds below.  Consults: None  Significant Diagnostic Studies: labs:   Results for orders placed or performed during the hospital encounter of 08/13/17 (from the past 72 hour(s))  Urinalysis, Routine w reflex microscopic     Status: Abnormal   Collection Time: 08/13/17  3:05 PM  Result Value Ref Range   Color, Urine STRAW (A) YELLOW   APPearance CLEAR CLEAR   Specific Gravity, Urine 1.005 1.005 - 1.030   pH 7.0 5.0 - 8.0   Glucose, UA NEGATIVE NEGATIVE mg/dL   Hgb urine dipstick LARGE (A) NEGATIVE   Bilirubin Urine NEGATIVE NEGATIVE   Ketones, ur NEGATIVE NEGATIVE mg/dL   Protein, ur NEGATIVE NEGATIVE mg/dL   Nitrite NEGATIVE NEGATIVE   Leukocytes, UA TRACE (A) NEGATIVE   RBC / HPF 0-5 0 - 5 RBC/hpf   WBC, UA 0-5 0 - 5 WBC/hpf   Bacteria, UA RARE (A) NONE SEEN   Squamous Epithelial / LPF 0-5 0 - 5    Comment: Performed at [facility], [address], [place], [place] [address]  Protein / creatinine ratio, urine     Status: None   Collection Time: 08/13/17  3:05 PM  Result Value Ref Range   Creatinine, Urine 36.00 mg/dL   Total Protein, Urine <6 mg/dL    Comment: REPEATED TO VERIFY   Protein Creatinine Ratio        0.00 - 0.15 mg/mg[Cre]    Comment: RESULT BELOW REPORTABLE RANGE, UNABLE TO CALCULATE. Performed at [facility], [address], [place], [place] [address]   CBC     Status: Abnormal   Collection Time: 08/13/17  3:49 PM  Result Value Ref Range   WBC 7.1 4.0 - 10.5 K/uL   RBC 4.55 3.87 - 5.11 MIL/uL   Hemoglobin  10.4 (L) 12.0 - 15.0 g/dL   HCT 33.0 (L) 36.0 - 46.0 %   MCV 72.5 (L) 78.0 - 100.0 fL   MCH 22.9 (L) 26.0 - 34.0 pg   MCHC 31.5 30.0 - 36.0 g/dL   RDW 17.2 (H) 11.5 - 15.5 %   Platelets 303 150 - 400 K/uL    Comment: Performed at [facility], [address], [place], [place] [address]  Comprehensive metabolic panel     Status: Abnormal   Collection Time: 08/13/17  3:49 PM  Result Value Ref Range   Sodium 138 135 - 145 mmol/L   Potassium 3.6 3.5 - 5.1 mmol/L   Chloride 104 101 - 111 mmol/L   CO2 24 22 - 32 mmol/L   Glucose, Bld 87 65 - 99 mg/dL   BUN 16 6 - 20 mg/dL   Creatinine, Ser 0.88 0.44 - 1.00 mg/dL   Calcium 8.3 (L) 8.9 - 10.3 mg/dL   Total Protein 6.3 (L) 6.5 - 8.1 g/dL   Albumin 2.6 (L) 3.5 - 5.0 g/dL   AST 98 (H) 15 - 41 U/L   ALT 104 (H) 14 - 54 U/L   Alkaline Phosphatase 111 38 - 126 U/L   Total Bilirubin 0.2 (L) 0.3 - 1.2 mg/dL   GFR calc non Af Amer >60 >60 mL/min   GFR calc  Af Amer >60 >60 mL/min    Comment: (NOTE) The eGFR has been calculated using the CKD EPI equation. This calculation has not been validated in all clinical situations. eGFR's persistently <60 mL/min signify possible Chronic Kidney Disease.    Anion gap 10 5 - 15    Comment: Performed at [facility], [address], [place], [place] [address]  Comprehensive metabolic panel     Status: Abnormal   Collection Time: 08/14/17  6:04 AM  Result Value Ref Range   Sodium 139 135 - 145 mmol/L   Potassium 3.4 (L) 3.5 - 5.1 mmol/L   Chloride 103 101 - 111 mmol/L   CO2 25 22 - 32 mmol/L   Glucose, Bld 97 65 - 99 mg/dL   BUN 11 6 - 20 mg/dL   Creatinine, Ser 0.92 0.44 - 1.00 mg/dL   Calcium 7.3 (L) 8.9 - 10.3 mg/dL   Total Protein 6.2 (L) 6.5 - 8.1 g/dL   Albumin 2.6 (L) 3.5 - 5.0 g/dL   AST 72 (H) 15 - 41 U/L   ALT 89 (H) 14 - 54 U/L   Alkaline Phosphatase 99 38 - 126 U/L   Total Bilirubin 0.2 (L) 0.3 - 1.2 mg/dL   GFR calc non Af Amer >60 >60 mL/min   GFR calc Af Amer >60 >60 mL/min     Comment: (NOTE) The eGFR has been calculated using the CKD EPI equation. This calculation has not been validated in all clinical situations. eGFR's persistently <60 mL/min signify possible Chronic Kidney Disease.    Anion gap 11 5 - 15    Comment: Performed at [facility], [address], [place], [place] [address]    Treatments: magnesium sulfate and blood pressure control  Discharge Exam: Blood pressure (!) 147/91, pulse 69, temperature 98.6 F (37 C), temperature source Oral, resp. rate 18, height 5' (1.524 m), weight 176 lb 4 oz (79.9 kg), SpO2 100 %, currently breastfeeding.   Disposition: Discharge disposition: 01-Home or Self Care       Discharge Instructions    Call MD for:  temperature >100.4   Complete by:  As directed    Diet - low sodium heart healthy   Complete by:  As directed    Discharge instructions   Complete by:  As directed    Routine activity.  Take over the counter ibuprophen up to 800 mg every 8 hours for pain.   Discharge wound care:   Complete by:  As directed    Sitz baths and icepacks to perineum.  If stitches, they will dissolve.   Sexual acrtivity   Complete by:  As directed    Nothing per vagina for 6 weeks.     Allergies as of 08/15/2017   No Known Allergies     Medication List    TAKE these medications   cetirizine 10 MG tablet Commonly known as:  ZYRTEC Take 10 mg by mouth daily.   fluticasone 50 MCG/ACT nasal spray Commonly known as:  FLONASE Place 2 sprays into both nostrils daily.   ibuprofen 600 MG tablet Commonly known as:  ADVIL,MOTRIN Take 1 tablet (600 mg total) by mouth every 6 (six) hours.   methyldopa 500 MG tablet Commonly known as:  ALDOMET Take 1 tablet (500 mg total) by mouth every 8 (eight) hours. What changed:  when to take this   metoprolol succinate 50 MG 24 hr tablet Commonly known as:  TOPROL-XL Take 1 tablet (50 mg total) by mouth daily. Take  with or immediately following a meal. What  changed:  additional instructions   Prenatal Vitamins 0.8 MG tablet Take 1 tablet by mouth daily.            Discharge Care Instructions  (From admission, onward)        Start     Ordered   08/15/17 0000  Discharge wound care:    Comments:  Sitz baths and icepacks to perineum.  If stitches, they will dissolve.   08/15/17 0914     Follow-up Information    Ob/Gyn, [facility] Follow up in 1 week(s).   Why:  bp check Contact information: [place] [address] [phone]           Signed: Daria Pastures 08/15/2017, 9:15 AM

## 2017-08-16 ENCOUNTER — Inpatient Hospital Stay (HOSPITAL_COMMUNITY)
Admission: AD | Admit: 2017-08-16 | Discharge: 2017-08-18 | DRG: 776 | Disposition: A | Discharge status: Home / Self Care | Payer: Medicaid Other | Source: Ambulatory Visit | Attending: Obstetrics and Gynecology | Admitting: Obstetrics and Gynecology

## 2017-08-16 DIAGNOSIS — O119 Pre-existing hypertension with pre-eclampsia, unspecified trimester: Secondary | ICD-10-CM | POA: Diagnosis present

## 2017-08-16 DIAGNOSIS — O1425 HELLP syndrome, complicating the puerperium: Principal | ICD-10-CM | POA: Diagnosis present

## 2017-08-17 ENCOUNTER — Encounter (HOSPITAL_COMMUNITY): Payer: Self-pay | Admitting: *Deleted

## 2017-08-17 ENCOUNTER — Other Ambulatory Visit: Payer: Self-pay

## 2017-08-17 DIAGNOSIS — O1425 HELLP syndrome, complicating the puerperium: Secondary | ICD-10-CM | POA: Diagnosis present

## 2017-08-17 DIAGNOSIS — O119 Pre-existing hypertension with pre-eclampsia, unspecified trimester: Secondary | ICD-10-CM | POA: Diagnosis present

## 2017-08-17 LAB — CBC
HCT: 41.3 % (ref 36.0–46.0)
Hemoglobin: 12.9 g/dL (ref 12.0–15.0)
MCH: 22.8 pg — ABNORMAL LOW (ref 26.0–34.0)
MCHC: 31.2 g/dL (ref 30.0–36.0)
MCV: 73.1 fL — ABNORMAL LOW (ref 78.0–100.0)
Platelets: 444 10*3/uL — ABNORMAL HIGH (ref 150–400)
RBC: 5.65 MIL/uL — ABNORMAL HIGH (ref 3.87–5.11)
RDW: 17.2 % — ABNORMAL HIGH (ref 11.5–15.5)
WBC: 7.2 10*3/uL (ref 4.0–10.5)

## 2017-08-17 LAB — COMPREHENSIVE METABOLIC PANEL
ALT: 59 U/L — ABNORMAL HIGH (ref 14–54)
AST: 39 U/L (ref 15–41)
Albumin: 3.1 g/dL — ABNORMAL LOW (ref 3.5–5.0)
Alkaline Phosphatase: 128 U/L — ABNORMAL HIGH (ref 38–126)
Anion gap: 11 (ref 5–15)
BUN: 18 mg/dL (ref 6–20)
CO2: 20 mmol/L — ABNORMAL LOW (ref 22–32)
Calcium: 8.9 mg/dL (ref 8.9–10.3)
Chloride: 106 mmol/L (ref 101–111)
Creatinine, Ser: 0.84 mg/dL (ref 0.44–1.00)
GFR calc Af Amer: >60 mL/min (ref >60)
GFR calc non Af Amer: >60 mL/min (ref >60)
Glucose, Bld: 90 mg/dL (ref 65–99)
Potassium: 4.5 mmol/L (ref 3.5–5.1)
Sodium: 137 mmol/L (ref 135–145)
Total Bilirubin: 0.2 mg/dL — ABNORMAL LOW (ref 0.3–1.2)
Total Protein: 7.1 g/dL (ref 6.5–8.1)

## 2017-08-17 LAB — URINALYSIS, ROUTINE W REFLEX MICROSCOPIC
BILIRUBIN URINE: NEGATIVE
GLUCOSE, UA: NEGATIVE mg/dL
Ketones, ur: NEGATIVE mg/dL
LEUKOCYTES UA: NEGATIVE
Nitrite: NEGATIVE
Protein, ur: NEGATIVE mg/dL
SPECIFIC GRAVITY, URINE: 1.003 — AB (ref 1.005–1.030)
pH: 6 (ref 5.0–8.0)

## 2017-08-17 LAB — PROTEIN / CREATININE RATIO, URINE
Creatinine, Urine: 17 mg/dL
Protein Creatinine Ratio: 0.47 mg/mg{Cre} — ABNORMAL HIGH (ref 0.00–0.15)
Total Protein, Urine: 8 mg/dL

## 2017-08-17 MED ORDER — ACETAMINOPHEN 325 MG PO TABS
650.0000 mg | ORAL_TABLET | ORAL | Status: DC | PRN
  Administered 2017-08-17 (×3): 650 mg via ORAL
  Filled 2017-08-17 (×3): qty 2

## 2017-08-17 MED ORDER — NIFEDIPINE ER OSMOTIC RELEASE 30 MG PO TB24
30.0000 mg | ORAL_TABLET | Freq: Once | ORAL | Status: AC
  Administered 2017-08-17: 30 mg via ORAL
  Filled 2017-08-17: qty 1

## 2017-08-17 MED ORDER — NIFEDIPINE ER OSMOTIC RELEASE 30 MG PO TB24
30.0000 mg | ORAL_TABLET | ORAL | Status: DC
  Administered 2017-08-18: 30 mg via ORAL
  Filled 2017-08-17: qty 1

## 2017-08-17 MED ORDER — ZOLPIDEM TARTRATE 5 MG PO TABS: 5.0000 mg | ORAL_TABLET | Freq: Every evening | ORAL | Status: DC | PRN

## 2017-08-17 MED ORDER — HYDRALAZINE HCL 20 MG/ML IJ SOLN
5.0000 mg | INTRAMUSCULAR | Status: AC | PRN
  Administered 2017-08-17: 10 mg via INTRAVENOUS
  Administered 2017-08-17: 5 mg via INTRAVENOUS
  Filled 2017-08-17: qty 1

## 2017-08-17 MED ORDER — LABETALOL HCL 5 MG/ML IV SOLN
20.0000 mg | INTRAVENOUS | Status: AC | PRN
  Administered 2017-08-17: 40 mg via INTRAVENOUS
  Administered 2017-08-17: 20 mg via INTRAVENOUS
  Filled 2017-08-17: qty 4
  Filled 2017-08-17: qty 8

## 2017-08-17 MED ORDER — CALCIUM CARBONATE ANTACID 500 MG PO CHEW: 2.0000 | CHEWABLE_TABLET | ORAL | Status: DC | PRN

## 2017-08-17 MED ORDER — PRENATAL MULTIVITAMIN CH
1.0000 | ORAL_TABLET | Freq: Every day | ORAL | Status: DC
  Administered 2017-08-17 – 2017-08-18 (×2): 1 via ORAL
  Filled 2017-08-17 (×2): qty 1

## 2017-08-17 MED ORDER — DOCUSATE SODIUM 100 MG PO CAPS
100.0000 mg | ORAL_CAPSULE | Freq: Every day | ORAL | Status: DC
  Administered 2017-08-17 – 2017-08-18 (×2): 100 mg via ORAL
  Filled 2017-08-17 (×2): qty 1

## 2017-08-17 NOTE — Plan of Care (Signed)
Patient is ambulating in her room without any difficulty. Patient denies any pain or discomfort at this time. Blood pressure checks have been within normal limits during this shift.

## 2017-08-17 NOTE — MAU Note (Signed)
Presents to MAU following delivery and readmission for HTN.  States BP was high at check at home. Instructed by Physicians to take  Blood pressure 2x'/ day.  Takes Aldomet 3x/day last was at 2130.  Metroprolol at 2145 (could  Not remember if she took this dose this morning ) so she took again

## 2017-08-17 NOTE — MAU Provider Note (Signed)
History     CSN: [id number]  Arrival date and time: 08/16/17 2345   First Provider Initiated Contact with Patient 08/17/17 0021      Chief Complaint  Patient presents with  . Hypertension   Emily Downs is a 37 y.o. [id number] who is S/P NSVD on 08/10/17. She has CHTN, and had superimposed pre-eclampsia with severe features. She was readmitted on 08/13/17 for Mag and B/P management. She was on Aldomet and Metoprolol prior to pregnancy as prescribed by her PCP. She was switched to these due to trying to conceive. Before she was switched she was on HCTZ and metoprolol. She stayed on those medications during the pregnancy. When she was DC home she was told to increase aldomet to TID. She has been taking her medications as prescribed.   Hypertension  This is a new problem. The current episode started in the past 7 days. The problem is unchanged. The problem is uncontrolled. Pertinent negatives include no chest pain, headaches or shortness of breath. Associated agents: pregnancy  Past treatments include beta blockers.    Past Medical History:  Diagnosis Date  . Allergy   . Beta thalassemia trait   . Genital herpes   . Gestational diabetes   . Hx of pre-eclampsia in prior pregnancy, currently pregnant   . Hypertension   . Iron deficiency anemia   . Menometrorrhagia     Past Surgical History:  Procedure Laterality Date  . DILATION AND EVACUATION  2009  . DILATION AND EVACUATION N/A 11/08/2015   Procedure: DILATATION AND EVACUATION WITH CHROMASOME STUDIES;  Surgeon: Servando Salina, MD;  Location: [facility];  Service: Gynecology;  Laterality: N/A;    Family History  Problem Relation Age of Onset  . Hypertension Mother   . Diabetes Mother   . Aneurysm Mother 61       brain aneurysm  . Sarcoidosis Mother   . Hyperlipidemia Father   . Diabetes Maternal Grandmother   . Stroke Maternal Grandmother   . Cancer Paternal Grandmother   . Crohn's disease Sister   . Hypertension Sister      Social History   Tobacco Use  . Smoking status: Never Smoker  . Smokeless tobacco: Never Used  Substance Use Topics  . Alcohol use: No    Frequency: Never  . Drug use: No    Allergies: No Known Allergies  Medications Prior to Admission  Medication Sig Dispense Refill Last Dose  . cetirizine (ZYRTEC) 10 MG tablet Take 10 mg by mouth daily.   08/13/2017 at Unknown time  . fluticasone (FLONASE) 50 MCG/ACT nasal spray Place 2 sprays into both nostrils daily. (Patient not taking: Reported on 08/14/2017) 16 g 11 Not Taking at Unknown time  . ibuprofen (ADVIL,MOTRIN) 600 MG tablet Take 1 tablet (600 mg total) by mouth every 6 (six) hours. 60 tablet 0 08/13/2017 at Unknown time  . methyldopa (ALDOMET) 500 MG tablet Take 1 tablet (500 mg total) by mouth every 8 (eight) hours. 90 tablet 4   . metoprolol succinate (TOPROL-XL) 50 MG 24 hr tablet Take 1 tablet (50 mg total) by mouth daily. Take with or immediately following a meal. 90 tablet 3   . Prenatal Multivit-Min-Fe-FA (PRENATAL VITAMINS) 0.8 MG tablet Take 1 tablet by mouth daily. 100 tablet 3 08/13/2017 at Unknown time    Review of Systems  Constitutional: Negative for fever.  Eyes: Negative for visual disturbance.  Respiratory: Negative for shortness of breath.   Cardiovascular: Negative for chest pain.  Gastrointestinal:  Negative for abdominal pain (denies RUQ pain ), nausea and vomiting.  Neurological: Negative for headaches.   Physical Exam   Blood pressure (!) 188/102, pulse (!) 58, temperature 98.4 F (36.9 C), temperature source Oral, resp. rate 18, height 5' (1.524 m), weight 171 lb 12.8 oz (77.9 kg), currently breastfeeding.  Physical Exam  Nursing note and vitals reviewed. Constitutional: She is oriented to person, place, and time. She appears well-developed and well-nourished. No distress.  HENT:  Head: Normocephalic.  Cardiovascular: Normal rate.  Respiratory: Effort normal.  GI: Soft. There is no tenderness.  There is no rebound.  Neurological: She is alert and oriented to person, place, and time.  Skin: Skin is warm and dry.  Psychiatric: She has a normal mood and affect.   Results for orders placed or performed during the hospital encounter of 08/16/17 (from the past 24 hour(s))  Protein / creatinine ratio, urine     Status: Abnormal   Collection Time: 08/16/17 11:53 PM  Result Value Ref Range   Creatinine, Urine 17.00 mg/dL   Total Protein, Urine 8 mg/dL   Protein Creatinine Ratio 0.47 (H) 0.00 - 0.15 mg/mg[Cre]  Urinalysis, Routine w reflex microscopic     Status: Abnormal   Collection Time: 08/16/17 11:53 PM  Result Value Ref Range   Color, Urine COLORLESS (A) YELLOW   APPearance CLEAR CLEAR   Specific Gravity, Urine 1.003 (L) 1.005 - 1.030   pH 6.0 5.0 - 8.0   Glucose, UA NEGATIVE NEGATIVE mg/dL   Hgb urine dipstick SMALL (A) NEGATIVE   Bilirubin Urine NEGATIVE NEGATIVE   Ketones, ur NEGATIVE NEGATIVE mg/dL   Protein, ur NEGATIVE NEGATIVE mg/dL   Nitrite NEGATIVE NEGATIVE   Leukocytes, UA NEGATIVE NEGATIVE   WBC, UA 0-5 0 - 5 WBC/hpf   Bacteria, UA RARE (A) NONE SEEN   Squamous Epithelial / LPF 0-5 0 - 5  Comprehensive metabolic panel     Status: Abnormal   Collection Time: 08/17/17  1:05 AM  Result Value Ref Range   Sodium 137 135 - 145 mmol/L   Potassium 4.5 3.5 - 5.1 mmol/L   Chloride 106 101 - 111 mmol/L   CO2 20 (L) 22 - 32 mmol/L   Glucose, Bld 90 65 - 99 mg/dL   BUN 18 6 - 20 mg/dL   Creatinine, Ser 0.84 0.44 - 1.00 mg/dL   Calcium 8.9 8.9 - 10.3 mg/dL   Total Protein 7.1 6.5 - 8.1 g/dL   Albumin 3.1 (L) 3.5 - 5.0 g/dL   AST 39 15 - 41 U/L   ALT 59 (H) 14 - 54 U/L   Alkaline Phosphatase 128 (H) 38 - 126 U/L   Total Bilirubin 0.2 (L) 0.3 - 1.2 mg/dL   GFR calc non Af Amer >60 >60 mL/min   GFR calc Af Amer >60 >60 mL/min   Anion gap 11 5 - 15  CBC     Status: Abnormal   Collection Time: 08/17/17  1:05 AM  Result Value Ref Range   WBC 7.2 4.0 - 10.5 K/uL    RBC 5.65 (H) 3.87 - 5.11 MIL/uL   Hemoglobin 12.9 12.0 - 15.0 g/dL   HCT 41.3 36.0 - 46.0 %   MCV 73.1 (L) 78.0 - 100.0 fL   MCH 22.8 (L) 26.0 - 34.0 pg   MCHC 31.2 30.0 - 36.0 g/dL   RDW 17.2 (H) 11.5 - 15.5 %   Platelets 444 (H) 150 - 400 K/uL   08/17/17 0411  -    -  -  136/76  -  -  -  - SR   08/17/17 0349  -    -  -  130/78  -  -  -  - SR   08/17/17 0324  -    -  -  145/77  -  -  -  - SR   08/17/17 0308  -    -  -  160/86  -  -  -  - SR   08/17/17 0247  -    -  -  151/82  -  -  -  - SR   08/17/17 0227  -    -  -  145/86  -  -  -  - SR   08/17/17 0212  -    -  -  178/78  -  -  -  - SR   08/17/17 0153  -    -  -  143/84  -  -  -  - SR   08/17/17 0133  -    -  -  174/95  -  -  -  - SR   08/17/17 0131  -    -  -  174/95  -  -  -  - SR   08/17/17 0121  -    -  -  155/92  -  -  -  - SR   08/17/17 0111  -    -  -  172/102  -  -  -  - SR   08/17/17 0110  -    -  -  172/102  -  -  -  - SR   08/17/17 0031  -    -  -  159/100  -  -  -  - SR   08/17/17 0020  -    -  -  179/96  -  -  -  - SR   08/17/17 0011  -    -  -  188/102   -  -  -  - SR   08/17/17 0002  98.4 F (36.9 C)    -  18  171/101  -  -  -  - SR   08/17/17 0000  -    -  -  166/94  -  -  -  - SR     MAU Course  Procedures  MDM 0416: DW Dr. Rogue Bussing, will admit for blood pressure management. Start on Procardia 30mg  XL, and repeat labs tomorrow.   Assessment and Plan  CHTN with superimposed pre-eclampsia Blood pressure not under great control Will admit to ante to adjust blood pressure medications and repeat labs in the morning   Marcille Buffy 08/17/2017, 12:23 AM

## 2017-08-17 NOTE — H&P (Signed)
37 y.o. presents PPD#7 with elevated BPS at home.  She is status post NSVD 5/18 after undergoing induction for [facility], PPD#1Methyldopa was increased to 500mg  tid. She was discharged PPD#2 with stable BPs on methyldopa 500 tid and metoprolol 50 XL qd.  PPD#3 was was readmitted for [facility] with HELLP and severe range BPS requiring IV labetalol in MAU x 3 and IV hydralazine in MAU x 1.  LFTs were found to be elevated and platelets in normal range.  She received 24 hours of MgSO4 and per notes did not require additional BP medications.  She was discharged home PPD#4.  She now presents with severe range BPs.  Past Medical History:  Diagnosis Date  . Allergy   . Beta thalassemia trait   . Genital herpes   . Gestational diabetes   . Hx of pre-eclampsia in prior pregnancy, currently pregnant   . Hypertension   . Iron deficiency anemia   . Menometrorrhagia    Past Surgical History:  Procedure Laterality Date  . DILATION AND EVACUATION  2009  . DILATION AND EVACUATION N/A 11/08/2015   Procedure: DILATATION AND EVACUATION WITH CHROMASOME STUDIES;  Surgeon: Servando Salina, MD;  Location: [facility];  Service: Gynecology;  Laterality: N/A;    Social History   Socioeconomic History  . Marital status: Married    Spouse name: Karem Farha  . Number of children: 1  . Years of education: MBA  . Highest education level: Not on file  Occupational History  . Occupation: Probation officer  . Occupation: residence Charity fundraiser    Comment: [employer] (04/23/2016)  Social Needs  . Financial resource strain: Not on file  . Food insecurity:    Worry: Not on file    Inability: Not on file  . Transportation needs:    Medical: Not on file    Non-medical: Not on file  Tobacco Use  . Smoking status: Never Smoker  . Smokeless tobacco: Never Used  Substance and Sexual Activity  . Alcohol use: No    Frequency: Never  . Drug use: No  . Sexual activity: Not Currently    Birth control/protection: None  Lifestyle  .  Physical activity:    Days per week: Not on file    Minutes per session: Not on file  . Stress: Not on file  Relationships  . Social connections:    Talks on phone: Not on file    Gets together: Not on file    Attends religious service: Not on file    Active member of club or organization: Not on file    Attends meetings of clubs or organizations: Not on file    Relationship status: Not on file  . Intimate partner violence:    Fear of current or ex partner: Not on file    Emotionally abused: Not on file    Physically abused: Not on file    Forced sexual activity: Not on file  Other Topics Concern  . Not on file  Social History Narrative   Marital status: married x 7 years      Children: 51 yo daughter.      Lives: with husband, daughter.      Employment:  Theme park manager, Residence Charity fundraiser      Tobacco; none      Alcohol: rare to none in 2018      Drugs:  None      Exercise: none in 2018      Seatbelt: 100%    No current  facility-administered medications on file prior to encounter.    Current Outpatient Medications on File Prior to Encounter  Medication Sig Dispense Refill  . cetirizine (ZYRTEC) 10 MG tablet Take 10 mg by mouth daily.    . fluticasone (FLONASE) 50 MCG/ACT nasal spray Place 2 sprays into both nostrils daily. (Patient not taking: Reported on 08/14/2017) 16 g 11  . ibuprofen (ADVIL,MOTRIN) 600 MG tablet Take 1 tablet (600 mg total) by mouth every 6 (six) hours. 60 tablet 0  . methyldopa (ALDOMET) 500 MG tablet Take 1 tablet (500 mg total) by mouth every 8 (eight) hours. 90 tablet 4  . metoprolol succinate (TOPROL-XL) 50 MG 24 hr tablet Take 1 tablet (50 mg total) by mouth daily. Take with or immediately following a meal. 90 tablet 3  . Prenatal Multivit-Min-Fe-FA (PRENATAL VITAMINS) 0.8 MG tablet Take 1 tablet by mouth daily. 100 tablet 3    No Known Allergies  Vitals:   08/17/17 0349 08/17/17 0411 08/17/17 0433 08/17/17 0802  BP: 130/78 136/76 (!) 147/85  122/77  Pulse: 68 68 66 67  Resp:   17 18  Temp:   98.1 F (36.7 C) 98.2 F (36.8 C)  TempSrc:   Oral Oral  SpO2:   100% 100%  Weight:      Height:        Lungs: clear to ascultation Cor:  RRR Abdomen:  soft, nontender, nondistended. Ex:  no cords, erythema Pelvic:  deferred  A:  Admit for uncontrolled CHTN with resolving HELLP  P:  LFTs are continuing to trend down, will recheck in am Platelets remain in normal range Add Procardia 30XL to BP regimen and monitor. Pt is s/p 24 hr MgSO4 on PPD#3-#4, will hold repeat course of MgSO4 for now, if has additional breakthough BPs with added medication will consider doing additional course of MgSO4. SCDs, heart healthy diet Other routine care.  Allyn Kenner

## 2017-08-18 LAB — CBC
HEMATOCRIT: 38.5 % (ref 36.0–46.0)
HEMOGLOBIN: 12.2 g/dL (ref 12.0–15.0)
MCH: 22.9 pg — ABNORMAL LOW (ref 26.0–34.0)
MCHC: 31.7 g/dL (ref 30.0–36.0)
MCV: 72.4 fL — AB (ref 78.0–100.0)
PLATELETS: 409 10*3/uL — AB (ref 150–400)
RBC: 5.32 MIL/uL — AB (ref 3.87–5.11)
RDW: 17.3 % — ABNORMAL HIGH (ref 11.5–15.5)
WBC: 6.4 10*3/uL (ref 4.0–10.5)

## 2017-08-18 LAB — URINALYSIS, ROUTINE W REFLEX MICROSCOPIC
Bilirubin Urine: NEGATIVE
GLUCOSE, UA: NEGATIVE mg/dL
KETONES UR: NEGATIVE mg/dL
Leukocytes, UA: NEGATIVE
Nitrite: NEGATIVE
PROTEIN: NEGATIVE mg/dL
Specific Gravity, Urine: 1.011 (ref 1.005–1.030)
pH: 6 (ref 5.0–8.0)

## 2017-08-18 LAB — COMPREHENSIVE METABOLIC PANEL
ALK PHOS: 104 U/L (ref 38–126)
ALT: 44 U/L (ref 14–54)
AST: 28 U/L (ref 15–41)
Albumin: 2.9 g/dL — ABNORMAL LOW (ref 3.5–5.0)
Anion gap: 11 (ref 5–15)
BILIRUBIN TOTAL: 0.3 mg/dL (ref 0.3–1.2)
BUN: 18 mg/dL (ref 6–20)
CO2: 20 mmol/L — ABNORMAL LOW (ref 22–32)
CREATININE: 0.9 mg/dL (ref 0.44–1.00)
Calcium: 8.3 mg/dL — ABNORMAL LOW (ref 8.9–10.3)
Chloride: 106 mmol/L (ref 101–111)
Glucose, Bld: 83 mg/dL (ref 65–99)
Potassium: 4.2 mmol/L (ref 3.5–5.1)
Sodium: 137 mmol/L (ref 135–145)
Total Protein: 6.6 g/dL (ref 6.5–8.1)

## 2017-08-18 MED ORDER — NIFEDIPINE ER 30 MG PO TB24: 30.0000 mg | ORAL_TABLET | ORAL | 2 refills | Status: DC

## 2017-08-18 NOTE — Progress Notes (Signed)
   08/18/17 0746  Vitals  Temp 98.2 F (36.8 C)  Temp Source Oral  BP (!) 134/94  MAP (mmHg) 108  BP Location Right Arm  BP Method Automatic  Patient Position (if appropriate) Lying  Pulse Rate 74  Resp 16  Dr Rogue Bussing was made aware of the two last blood pressures.  Pt c/o  small frequent voids, MD was made aware, order received for UA collection.

## 2017-08-18 NOTE — Progress Notes (Signed)
All discharge teaching completed. All printed discharge instructions given and explained to the patient. Patient verbalizes an understanding of all instructions given and denies any questions or concerns.

## 2017-08-18 NOTE — Progress Notes (Signed)
Patient is feeling well, no complaint, no HA, vision change or RUQ pain. Procardia XL30mg  started yesterday morning and has kept BPs under good control.  BP checked this morning 43min prior to administering next dose of Procardia was severe range.  Returned to normal/mild after medication given.  Pt has appt Wed and will check BPs at home intermittently until appt.  Rx for Procardia XL 30mg  sent to pt's pharmacy.

## 2017-08-18 NOTE — Discharge Summary (Signed)
HD#2, PPD#8 Pt presented yesterday with severe range BPs at home, BPs in MAU found to be severe range requring multiple doses of IV BP meds.  Methyldopa and metoprolol pt was taking at home were discontinued and Procardia XL 30mg  started.  Pt had been admitted prior on  PPD#3 for postpartum HELLP and had course of MgSO4 and only minor adjustment of methyldopa dosing.  Repeat administration of MgSO4 not done this admission.  LFTs that had been elevated normalized this admission.  BPS well controlled on Procardia with only 1 elevated severe range 9min prior to 2nd dose of procardia being given this am.  Pt desires d/c home and has access to checking BPs, also has appt schedule for f/u Wed.

## 2017-08-18 NOTE — Progress Notes (Signed)
   08/18/17 0435  Vital Signs  BP (!) 168/105  BP Location Right Arm  Patient Position (if appropriate) Lying  BP Method Automatic  Pulse Rate 74  Resp 14  Temp 98.6 F (37 C)  Temp Source Oral  Oxygen Therapy  SpO2 100 %  Scheduled Procardia 10 mg po administered. We will reassess in 1 hr.

## 2017-10-07 DIAGNOSIS — Z3202 Encounter for pregnancy test, result negative: Secondary | ICD-10-CM | POA: Diagnosis not present

## 2017-10-07 DIAGNOSIS — R87619 Unspecified abnormal cytological findings in specimens from cervix uteri: Secondary | ICD-10-CM | POA: Diagnosis not present

## 2017-10-07 DIAGNOSIS — R87810 Cervical high risk human papillomavirus (HPV) DNA test positive: Secondary | ICD-10-CM | POA: Diagnosis not present

## 2017-10-07 DIAGNOSIS — N72 Inflammatory disease of cervix uteri: Secondary | ICD-10-CM | POA: Diagnosis not present

## 2018-02-24 ENCOUNTER — Encounter: Payer: Self-pay | Admitting: Emergency Medicine

## 2018-02-24 ENCOUNTER — Other Ambulatory Visit: Payer: Self-pay

## 2018-02-24 ENCOUNTER — Ambulatory Visit (INDEPENDENT_AMBULATORY_CARE_PROVIDER_SITE_OTHER): Payer: BLUE CROSS/BLUE SHIELD | Admitting: Emergency Medicine

## 2018-02-24 VITALS — BP 120/70 | HR 95 | Temp 97.6°F | Ht 60.0 in | Wt 159.6 lb

## 2018-02-24 DIAGNOSIS — R42 Dizziness and giddiness: Secondary | ICD-10-CM | POA: Insufficient documentation

## 2018-02-24 DIAGNOSIS — I10 Essential (primary) hypertension: Secondary | ICD-10-CM

## 2018-02-24 DIAGNOSIS — Z23 Encounter for immunization: Secondary | ICD-10-CM | POA: Diagnosis not present

## 2018-02-24 LAB — COMPREHENSIVE METABOLIC PANEL
ALT: 25 IU/L (ref 0–32)
AST: 29 IU/L (ref 0–40)
Albumin/Globulin Ratio: 1.4 (ref 1.2–2.2)
Albumin: 4.1 g/dL (ref 3.5–5.5)
Alkaline Phosphatase: 123 IU/L — ABNORMAL HIGH (ref 39–117)
BUN/Creatinine Ratio: 13 (ref 9–23)
BUN: 12 mg/dL (ref 6–20)
Bilirubin Total: <0.2 mg/dL (ref 0.0–1.2)
CALCIUM: 9.2 mg/dL (ref 8.7–10.2)
CHLORIDE: 101 mmol/L (ref 96–106)
CO2: 23 mmol/L (ref 20–29)
Creatinine, Ser: 0.91 mg/dL (ref 0.57–1.00)
GFR calc Af Amer: 92 mL/min/{1.73_m2} (ref >59)
GFR, EST NON AFRICAN AMERICAN: 80 mL/min/{1.73_m2} (ref >59)
GLUCOSE: 117 mg/dL — AB (ref 65–99)
Globulin, Total: 3 g/dL (ref 1.5–4.5)
POTASSIUM: 4 mmol/L (ref 3.5–5.2)
Sodium: 142 mmol/L (ref 134–144)
Total Protein: 7.1 g/dL (ref 6.0–8.5)

## 2018-02-24 LAB — CBC WITH DIFFERENTIAL/PLATELET
BASOS ABS: 0 10*3/uL (ref 0.0–0.2)
BASOS: 0 %
EOS (ABSOLUTE): 0.2 10*3/uL (ref 0.0–0.4)
Eos: 3 %
Hematocrit: 42.3 % (ref 34.0–46.6)
Hemoglobin: 13.4 g/dL (ref 11.1–15.9)
IMMATURE GRANS (ABS): 0 10*3/uL (ref 0.0–0.1)
IMMATURE GRANULOCYTES: 0 %
LYMPHS: 33 %
Lymphocytes Absolute: 1.9 10*3/uL (ref 0.7–3.1)
MCH: 22.5 pg — ABNORMAL LOW (ref 26.6–33.0)
MCHC: 31.7 g/dL (ref 31.5–35.7)
MCV: 71 fL — AB (ref 79–97)
MONOS ABS: 0.3 10*3/uL (ref 0.1–0.9)
Monocytes: 5 %
NEUTROS PCT: 59 %
Neutrophils Absolute: 3.3 10*3/uL (ref 1.4–7.0)
PLATELETS: 397 10*3/uL (ref 150–450)
RBC: 5.96 x10E6/uL — AB (ref 3.77–5.28)
RDW: 17.1 % — AB (ref 12.3–15.4)
WBC: 5.8 10*3/uL (ref 3.4–10.8)

## 2018-02-24 MED ORDER — NIFEDIPINE ER 60 MG PO TB24: 60.0000 mg | ORAL_TABLET | Freq: Every day | ORAL | 3 refills | Status: DC

## 2018-02-24 NOTE — Progress Notes (Addendum)
Emily Downs 37 y.o.   Chief Complaint  Patient presents with  . Hypertension  . Dizziness    started yesterday (was dizzy all day)   BP Readings from Last 3 Encounters:  02/24/18 120/70  08/18/17 (!) 134/94  08/15/17 (!) 147/91    HISTORY OF PRESENT ILLNESS: This is a 37 y.o. female with history of hypertension, on nifedipine complaining of intermittent dizziness for the past several days.  Mother of 33-month-old baby, not getting enough sleep, otherwise doing well.  Denies syncope.  No flulike symptoms.  Denies fever or chills.  Denies nausea or vomiting.  Eating and drinking well.  Denies any other significant associated symptoms.  Breast-feeding.  Taking liquid supplement to help with breastmilk production that has niacin in it.  HPI   Prior to Admission medications   Medication Sig Start Date End Date Taking? Authorizing Provider  FENUGREEK PO Take by mouth.   Yes [provider]  fluticasone Asencion Islam) 50 MCG/ACT nasal spray  07/05/14  Yes [provider]  ibuprofen (ADVIL,MOTRIN) 600 MG tablet ibuprofen 600 mg tablet  TAKE 1 TABLET BY MOUTH EVERY 6 HOURS   Yes [provider]  NIFEdipine (ADALAT CC) 60 MG 24 hr tablet Take 60 mg by mouth daily. 11/17/17  Yes [provider]  Prenatal Vit-Fe Fumarate-FA (PRENATAL PLUS/IRON) 27-1 MG TABS Prenatal Plus (calcium carbonate) 27 mg iron-1 mg tablet   Yes [provider]    No Known Allergies  Patient Active Problem List   Diagnosis Date Noted  . Chronic hypertension with superimposed pre-eclampsia 08/17/2017  . Pre-eclampsia superimposed on chronic hypertension, postpartum 08/13/2017  . Benign essential hypertension affecting pregnancy in third trimester 08/09/2017  . Impaired glucose tolerance test 06/18/2017  . Advanced maternal age in multigravida 03/14/2017  . [redacted] weeks gestation of pregnancy   . Seasonal allergic rhinitis due to pollen 09/19/2015  . Essential hypertension,  benign 12/11/2013  . Menometrorrhagia   . Iron deficiency anemia     Past Medical History:  Diagnosis Date  . Allergy   . Beta thalassemia trait   . Genital herpes   . Gestational diabetes   . Hx of pre-eclampsia in prior pregnancy, currently pregnant   . Hypertension   . Iron deficiency anemia   . Menometrorrhagia     Past Surgical History:  Procedure Laterality Date  . DILATION AND EVACUATION  2009  . DILATION AND EVACUATION N/A 11/08/2015   Procedure: DILATATION AND EVACUATION WITH CHROMASOME STUDIES;  Surgeon: Servando Salina, MD;  Location: [facility];  Service: Gynecology;  Laterality: N/A;    Social History   Socioeconomic History  . Marital status: Married    Spouse name: Jatziri Goffredo  . Number of children: 1  . Years of education: MBA  . Highest education level: Not on file  Occupational History  . Occupation: Probation officer  . Occupation: residence Charity fundraiser    Comment: [employer] (04/23/2016)  Social Needs  . Financial resource strain: Not on file  . Food insecurity:    Worry: Not on file    Inability: Not on file  . Transportation needs:    Medical: Not on file    Non-medical: Not on file  Tobacco Use  . Smoking status: Never Smoker  . Smokeless tobacco: Never Used  Substance and Sexual Activity  . Alcohol use: No    Frequency: Never  . Drug use: No  . Sexual activity: Not Currently    Birth control/protection: None  Lifestyle  .  Physical activity:    Days per week: Not on file    Minutes per session: Not on file  . Stress: Not on file  Relationships  . Social connections:    Talks on phone: Not on file    Gets together: Not on file    Attends religious service: Not on file    Active member of club or organization: Not on file    Attends meetings of clubs or organizations: Not on file    Relationship status: Not on file  . Intimate partner violence:    Fear of current or ex partner: Not on file    Emotionally abused: Not on file     Physically abused: Not on file    Forced sexual activity: Not on file  Other Topics Concern  . Not on file  Social History Narrative   Marital status: married x 7 years      Children: 2 yo daughter.      Lives: with husband, daughter.      Employment:  Theme park manager, Residence Charity fundraiser      Tobacco; none      Alcohol: rare to none in 2018      Drugs:  None      Exercise: none in 2018      Seatbelt: 100%    Family History  Problem Relation Age of Onset  . Hypertension Mother   . Diabetes Mother   . Aneurysm Mother 64       brain aneurysm  . Sarcoidosis Mother   . Hyperlipidemia Father   . Diabetes Maternal Grandmother   . Stroke Maternal Grandmother   . Cancer Paternal Grandmother   . Crohn's disease Sister   . Hypertension Sister      Review of Systems  Constitutional: Negative.  Negative for chills, fever and weight loss.  HENT: Negative.  Negative for congestion, nosebleeds and sore throat.   Eyes: Negative.  Negative for blurred vision and double vision.  Respiratory: Negative.  Negative for cough and shortness of breath.   Cardiovascular: Negative.  Negative for chest pain, palpitations, claudication and leg swelling.  Gastrointestinal: Negative.  Negative for abdominal pain, diarrhea, nausea and vomiting.  Genitourinary: Negative.  Negative for dysuria and hematuria.  Musculoskeletal: Negative.  Negative for back pain, myalgias and neck pain.  Skin: Negative.  Negative for rash.  Neurological: Positive for dizziness. Negative for sensory change, focal weakness and headaches.  Endo/Heme/Allergies: Negative.   All other systems reviewed and are negative.  Vitals:   02/24/18 0846 02/24/18 0851  BP: 139/89 120/70  Pulse: 95   Temp: 97.6 F (36.4 C)   SpO2: 95%     Orthostatic VS for the past 24 hrs (Last 3 readings):  BP- Lying Pulse- Lying BP- Sitting Pulse- Sitting BP- Standing at 0 minutes Pulse- Standing at 0 minutes BP- Standing at 3 minutes Pulse-  Standing at 3 minutes  02/24/18 0857 125/82 98 123/82 78 123/81 85 136/85 84    Physical Exam  Constitutional: She is oriented to person, place, and time. She appears well-developed and well-nourished.  HENT:  Head: Normocephalic and atraumatic.  Right Ear: Tympanic membrane, external ear and ear canal normal.  Left Ear: Tympanic membrane, external ear and ear canal normal.  Nose: Nose normal.  Mouth/Throat: Oropharynx is clear and moist.  Eyes: Pupils are equal, round, and reactive to light. Conjunctivae and EOM are normal.  Neck: Normal range of motion. Neck supple. No thyromegaly present.  Cardiovascular: Normal rate  and regular rhythm.  Murmur (2/6 systolic aortic area, no radiation to carotids) heard. Pulmonary/Chest: Effort normal and breath sounds normal.  Abdominal: Soft. She exhibits no distension. There is no tenderness.  Musculoskeletal: Normal range of motion. She exhibits no edema or tenderness.  Lymphadenopathy:    She has no cervical adenopathy.  Neurological: She is alert and oriented to person, place, and time. No sensory deficit. She exhibits normal muscle tone. Coordination normal.  Skin: Skin is warm and dry. Capillary refill takes less than 2 seconds. No rash noted.  Psychiatric: She has a normal mood and affect. Her behavior is normal.  Vitals reviewed.    ASSESSMENT & PLAN: Dizziness Clinically stable.  Normal vital signs.  No red flag signs or symptoms.  Nonspecific symptom with large differential diagnosis.  Will check blood work today.  Fairly normal last May 2019.  Not orthostatic.  Blood pressure not an issue today.  No neurological signs or symptoms.  Advised to monitor symptoms and return here if no better or worse in the next few weeks.  Shani was seen today for hypertension and dizziness.  Diagnoses and all orders for this visit:  Dizziness -     CBC with Differential/Platelet -     Comprehensive metabolic panel  Need for influenza  vaccination -     Flu Vaccine QUAD 36+ mos IM  Essential hypertension, benign -     NIFEdipine (ADALAT CC) 60 MG 24 hr tablet; Take 1 tablet (60 mg total) by mouth daily.     Patient Instructions       If you have lab work done today you will be contacted with your lab results within the next 2 weeks.  If you have not heard from [place] then please contact us. The fastest way to get your results is to register for My Chart.   IF you received an x-ray today, you will receive an invoice from [facility]. Please contact [facility] at [phone] with questions or concerns regarding your invoice.   IF you received labwork today, you will receive an invoice from [facility]. Please contact [facility] at [phone] with questions or concerns regarding your invoice.   Our billing staff will not be able to assist you with questions regarding bills from these companies.  You will be contacted with the lab results as soon as they are available. The fastest way to get your results is to activate your My Chart account. Instructions are located on the last page of this paperwork. If you have not heard from [place] regarding the results in 2 weeks, please contact this office.      Hypertension Hypertension, commonly called high blood pressure, is when the force of blood pumping through the arteries is too strong. The arteries are the blood vessels that carry blood from the heart throughout the body. Hypertension forces the heart to work harder to pump blood and may cause arteries to become narrow or stiff. Having untreated or uncontrolled hypertension can cause heart attacks, strokes, kidney disease, and other problems. A blood pressure reading consists of a higher number over a lower number. Ideally, your blood pressure should be below 120/80. The first ("top") number is called the systolic pressure. It is a measure of the pressure in your arteries as your heart beats. The second ("bottom")  number is called the diastolic pressure. It is a measure of the pressure in your arteries as the heart relaxes. What are the causes? The cause of this condition is not  known. What increases the risk? Some risk factors for high blood pressure are under your control. Others are not. Factors you can change  Smoking.  Having type 2 diabetes mellitus, high cholesterol, or both.  Not getting enough exercise or physical activity.  Being overweight.  Having too much fat, sugar, calories, or salt (sodium) in your diet.  Drinking too much alcohol. Factors that are difficult or impossible to change  Having chronic kidney disease.  Having a family history of high blood pressure.  Age. Risk increases with age.  Race. You may be at higher risk if you are African-American.  Gender. Men are at higher risk than women before age 33. After age 99, women are at higher risk than men.  Having obstructive sleep apnea.  Stress. What are the signs or symptoms? Extremely high blood pressure (hypertensive crisis) may cause:  Headache.  Anxiety.  Shortness of breath.  Nosebleed.  Nausea and vomiting.  Severe chest pain.  Jerky movements you cannot control (seizures).  How is this diagnosed? This condition is diagnosed by measuring your blood pressure while you are seated, with your arm resting on a surface. The cuff of the blood pressure monitor will be placed directly against the skin of your upper arm at the level of your heart. It should be measured at least twice using the same arm. Certain conditions can cause a difference in blood pressure between your right and left arms. Certain factors can cause blood pressure readings to be lower or higher than normal (elevated) for a short period of time:  When your blood pressure is higher when you are in a health care provider's office than when you are at home, this is called white coat hypertension. Most people with this condition do not need  medicines.  When your blood pressure is higher at home than when you are in a health care provider's office, this is called masked hypertension. Most people with this condition may need medicines to control blood pressure.  If you have a high blood pressure reading during one visit or you have normal blood pressure with other risk factors:  You may be asked to return on a different day to have your blood pressure checked again.  You may be asked to monitor your blood pressure at home for 1 week or longer.  If you are diagnosed with hypertension, you may have other blood or imaging tests to help your health care provider understand your overall risk for other conditions. How is this treated? This condition is treated by making healthy lifestyle changes, such as eating healthy foods, exercising more, and reducing your alcohol intake. Your health care provider may prescribe medicine if lifestyle changes are not enough to get your blood pressure under control, and if:  Your systolic blood pressure is above 130.  Your diastolic blood pressure is above 80.  Your personal target blood pressure may vary depending on your medical conditions, your age, and other factors. Follow these instructions at home: Eating and drinking  Eat a diet that is high in fiber and potassium, and low in sodium, added sugar, and fat. An example eating plan is called the DASH (Dietary Approaches to Stop Hypertension) diet. To eat this way: ? Eat plenty of fresh fruits and vegetables. Try to fill half of your plate at each meal with fruits and vegetables. ? Eat whole grains, such as whole wheat pasta, brown rice, or whole grain bread. Fill about one quarter of your plate with whole grains. ?  Eat or drink low-fat dairy products, such as skim milk or low-fat yogurt. ? Avoid fatty cuts of meat, processed or cured meats, and poultry with skin. Fill about one quarter of your plate with lean proteins, such as fish, chicken  without skin, beans, eggs, and tofu. ? Avoid premade and processed foods. These tend to be higher in sodium, added sugar, and fat.  Reduce your daily sodium intake. Most people with hypertension should eat less than 1,500 mg of sodium a day.  Limit alcohol intake to no more than 1 drink a day for nonpregnant women and 2 drinks a day for men. One drink equals 12 oz of beer, 5 oz of wine, or 1 oz of hard liquor. Lifestyle  Work with your health care provider to maintain a healthy body weight or to lose weight. Ask what an ideal weight is for you.  Get at least 30 minutes of exercise that causes your heart to beat faster (aerobic exercise) most days of the week. Activities may include walking, swimming, or biking.  Include exercise to strengthen your muscles (resistance exercise), such as pilates or lifting weights, as part of your weekly exercise routine. Try to do these types of exercises for 30 minutes at least 3 days a week.  Do not use any products that contain nicotine or tobacco, such as cigarettes and e-cigarettes. If you need help quitting, ask your health care provider.  Monitor your blood pressure at home as told by your health care provider.  Keep all follow-up visits as told by your health care provider. This is important. Medicines  Take over-the-counter and prescription medicines only as told by your health care provider. Follow directions carefully. Blood pressure medicines must be taken as prescribed.  Do not skip doses of blood pressure medicine. Doing this puts you at risk for problems and can make the medicine less effective.  Ask your health care provider about side effects or reactions to medicines that you should watch for. Contact a health care provider if:  You think you are having a reaction to a medicine you are taking.  You have headaches that keep coming back (recurring).  You feel dizzy.  You have swelling in your ankles.  You have trouble with your  vision. Get help right away if:  You develop a severe headache or confusion.  You have unusual weakness or numbness.  You feel faint.  You have severe pain in your chest or abdomen.  You vomit repeatedly.  You have trouble breathing. Summary  Hypertension is when the force of blood pumping through your arteries is too strong. If this condition is not controlled, it may put you at risk for serious complications.  Your personal target blood pressure may vary depending on your medical conditions, your age, and other factors. For most people, a normal blood pressure is less than 120/80.  Hypertension is treated with lifestyle changes, medicines, or a combination of both. Lifestyle changes include weight loss, eating a healthy, low-sodium diet, exercising more, and limiting alcohol. This information is not intended to replace advice given to you by your health care provider. Make sure you discuss any questions you have with your health care provider. Document Released: 03/12/2005 Document Revised: 02/08/2016 Document Reviewed: 02/08/2016 Elsevier Interactive Patient Education  2018 [employer]. Dizziness Dizziness is a common problem. It makes you feel unsteady or light-headed. You may feel like you are about to pass out (faint). Dizziness can lead to getting hurt if you stumble or fall.  Dizziness can be caused by many things, including:  Medicines.  Not having enough water in your body (dehydration).  Illness.  Follow these instructions at home: Eating and drinking  Drink enough fluid to keep your pee (urine) clear or pale yellow. This helps to keep you from getting dehydrated. Try to drink more clear fluids, such as water.  Do not drink alcohol.  Limit how much caffeine you drink or eat, if your doctor tells you to do that.  Limit how much salt (sodium) you drink or eat, if your doctor tells you to do that. Activity  Avoid making quick movements. ? When you stand up from  sitting in a chair, steady yourself until you feel okay. ? In the morning, first sit up on the side of the bed. When you feel okay, stand slowly while you hold onto something. Do this until you know that your balance is fine.  If you need to stand in one place for a long time, move your legs often. Tighten and relax the muscles in your legs while you are standing.  Do not drive or use heavy machinery if you feel dizzy.  Avoid bending down if you feel dizzy. Place items in your home so you can reach them easily without leaning over. Lifestyle  Do not use any products that contain nicotine or tobacco, such as cigarettes and e-cigarettes. If you need help quitting, ask your doctor.  Try to lower your stress level. You can do this by using methods such as yoga or meditation. Talk with your doctor if you need help. General instructions  Watch your dizziness for any changes.  Take over-the-counter and prescription medicines only as told by your doctor. Talk with your doctor if you think that you are dizzy because of a medicine that you are taking.  Tell a friend or a family member that you are feeling dizzy. If he or she notices any changes in your behavior, have this person call your doctor.  Keep all follow-up visits as told by your doctor. This is important. Contact a doctor if:  Your dizziness does not go away.  Your dizziness or light-headedness gets worse.  You feel sick to your stomach (nauseous).  You have trouble hearing.  You have new symptoms.  You are unsteady on your feet.  You feel like the room is spinning. Get help right away if:  You throw up (vomit) or have watery poop (diarrhea), and you cannot eat or drink anything.  You have trouble: ? Talking. ? Walking. ? Swallowing. ? Using your arms, hands, or legs.  You feel generally weak.  You are not thinking clearly, or you have trouble forming sentences. A friend or family member may notice this.  You  have: ? Chest pain. ? Pain in your belly (abdomen). ? Shortness of breath. ? Sweating.  Your vision changes.  You are bleeding.  You have a very bad headache.  You have neck pain or a stiff neck.  You have a fever. These symptoms may be an emergency. Do not wait to see if the symptoms will go away. Get medical help right away. Call your local emergency services (911 in the [place] Do not drive yourself to the hospital. Summary  Dizziness makes you feel unsteady or light-headed. You may feel like you are about to pass out (faint).  Drink enough fluid to keep your pee (urine) clear or pale yellow. Do not drink alcohol.  Avoid making quick movements if you feel  dizzy.  Watch your dizziness for any changes. This information is not intended to replace advice given to you by your health care provider. Make sure you discuss any questions you have with your health care provider. Document Released: 03/01/2011 Document Revised: 03/29/2016 Document Reviewed: 03/29/2016 Elsevier Interactive Patient Education  2017 [employer]      Agustina Caroli, MD [facility]

## 2018-02-24 NOTE — Addendum Note (Signed)
Addended by: Davina Poke on: 02/24/2018 09:41 AM   Modules accepted: Orders

## 2018-02-24 NOTE — Patient Instructions (Addendum)
If you have lab work done today you will be contacted with your lab results within the next 2 weeks.  If you have not heard from [place] then please contact us. The fastest way to get your results is to register for My Chart.   IF you received an x-ray today, you will receive an invoice from [facility]. Please contact [facility] at [phone] with questions or concerns regarding your invoice.   IF you received labwork today, you will receive an invoice from [facility]. Please contact [facility] at [phone] with questions or concerns regarding your invoice.   Our billing staff will not be able to assist you with questions regarding bills from these companies.  You will be contacted with the lab results as soon as they are available. The fastest way to get your results is to activate your My Chart account. Instructions are located on the last page of this paperwork. If you have not heard from [place] regarding the results in 2 weeks, please contact this office.      Hypertension Hypertension, commonly called high blood pressure, is when the force of blood pumping through the arteries is too strong. The arteries are the blood vessels that carry blood from the heart throughout the body. Hypertension forces the heart to work harder to pump blood and may cause arteries to become narrow or stiff. Having untreated or uncontrolled hypertension can cause heart attacks, strokes, kidney disease, and other problems. A blood pressure reading consists of a higher number over a lower number. Ideally, your blood pressure should be below 120/80. The first ("top") number is called the systolic pressure. It is a measure of the pressure in your arteries as your heart beats. The second ("bottom") number is called the diastolic pressure. It is a measure of the pressure in your arteries as the heart relaxes. What are the causes? The cause of this condition is not known. What increases the risk? Some  risk factors for high blood pressure are under your control. Others are not. Factors you can change  Smoking.  Having type 2 diabetes mellitus, high cholesterol, or both.  Not getting enough exercise or physical activity.  Being overweight.  Having too much fat, sugar, calories, or salt (sodium) in your diet.  Drinking too much alcohol. Factors that are difficult or impossible to change  Having chronic kidney disease.  Having a family history of high blood pressure.  Age. Risk increases with age.  Race. You may be at higher risk if you are African-American.  Gender. Men are at higher risk than women before age 86. After age 53, women are at higher risk than men.  Having obstructive sleep apnea.  Stress. What are the signs or symptoms? Extremely high blood pressure (hypertensive crisis) may cause:  Headache.  Anxiety.  Shortness of breath.  Nosebleed.  Nausea and vomiting.  Severe chest pain.  Jerky movements you cannot control (seizures).  How is this diagnosed? This condition is diagnosed by measuring your blood pressure while you are seated, with your arm resting on a surface. The cuff of the blood pressure monitor will be placed directly against the skin of your upper arm at the level of your heart. It should be measured at least twice using the same arm. Certain conditions can cause a difference in blood pressure between your right and left arms. Certain factors can cause blood pressure readings to be lower or higher than normal (elevated) for a short period of time:  When your blood pressure is higher when you are in a health care provider's office than when you are at home, this is called white coat hypertension. Most people with this condition do not need medicines.  When your blood pressure is higher at home than when you are in a health care provider's office, this is called masked hypertension. Most people with this condition may need medicines to control  blood pressure.  If you have a high blood pressure reading during one visit or you have normal blood pressure with other risk factors:  You may be asked to return on a different day to have your blood pressure checked again.  You may be asked to monitor your blood pressure at home for 1 week or longer.  If you are diagnosed with hypertension, you may have other blood or imaging tests to help your health care provider understand your overall risk for other conditions. How is this treated? This condition is treated by making healthy lifestyle changes, such as eating healthy foods, exercising more, and reducing your alcohol intake. Your health care provider may prescribe medicine if lifestyle changes are not enough to get your blood pressure under control, and if:  Your systolic blood pressure is above 130.  Your diastolic blood pressure is above 80.  Your personal target blood pressure may vary depending on your medical conditions, your age, and other factors. Follow these instructions at home: Eating and drinking  Eat a diet that is high in fiber and potassium, and low in sodium, added sugar, and fat. An example eating plan is called the DASH (Dietary Approaches to Stop Hypertension) diet. To eat this way: ? Eat plenty of fresh fruits and vegetables. Try to fill half of your plate at each meal with fruits and vegetables. ? Eat whole grains, such as whole wheat pasta, brown rice, or whole grain bread. Fill about one quarter of your plate with whole grains. ? Eat or drink low-fat dairy products, such as skim milk or low-fat yogurt. ? Avoid fatty cuts of meat, processed or cured meats, and poultry with skin. Fill about one quarter of your plate with lean proteins, such as fish, chicken without skin, beans, eggs, and tofu. ? Avoid premade and processed foods. These tend to be higher in sodium, added sugar, and fat.  Reduce your daily sodium intake. Most people with hypertension should eat less  than 1,500 mg of sodium a day.  Limit alcohol intake to no more than 1 drink a day for nonpregnant women and 2 drinks a day for men. One drink equals 12 oz of beer, 5 oz of wine, or 1 oz of hard liquor. Lifestyle  Work with your health care provider to maintain a healthy body weight or to lose weight. Ask what an ideal weight is for you.  Get at least 30 minutes of exercise that causes your heart to beat faster (aerobic exercise) most days of the week. Activities may include walking, swimming, or biking.  Include exercise to strengthen your muscles (resistance exercise), such as pilates or lifting weights, as part of your weekly exercise routine. Try to do these types of exercises for 30 minutes at least 3 days a week.  Do not use any products that contain nicotine or tobacco, such as cigarettes and e-cigarettes. If you need help quitting, ask your health care provider.  Monitor your blood pressure at home as told by your health care provider.  Keep all follow-up visits as told by your health care  provider. This is important. Medicines  Take over-the-counter and prescription medicines only as told by your health care provider. Follow directions carefully. Blood pressure medicines must be taken as prescribed.  Do not skip doses of blood pressure medicine. Doing this puts you at risk for problems and can make the medicine less effective.  Ask your health care provider about side effects or reactions to medicines that you should watch for. Contact a health care provider if:  You think you are having a reaction to a medicine you are taking.  You have headaches that keep coming back (recurring).  You feel dizzy.  You have swelling in your ankles.  You have trouble with your vision. Get help right away if:  You develop a severe headache or confusion.  You have unusual weakness or numbness.  You feel faint.  You have severe pain in your chest or abdomen.  You vomit  repeatedly.  You have trouble breathing. Summary  Hypertension is when the force of blood pumping through your arteries is too strong. If this condition is not controlled, it may put you at risk for serious complications.  Your personal target blood pressure may vary depending on your medical conditions, your age, and other factors. For most people, a normal blood pressure is less than 120/80.  Hypertension is treated with lifestyle changes, medicines, or a combination of both. Lifestyle changes include weight loss, eating a healthy, low-sodium diet, exercising more, and limiting alcohol. This information is not intended to replace advice given to you by your health care provider. Make sure you discuss any questions you have with your health care provider. Document Released: 03/12/2005 Document Revised: 02/08/2016 Document Reviewed: 02/08/2016 Elsevier Interactive Patient Education  2018 [employer]. Dizziness Dizziness is a common problem. It makes you feel unsteady or light-headed. You may feel like you are about to pass out (faint). Dizziness can lead to getting hurt if you stumble or fall. Dizziness can be caused by many things, including:  Medicines.  Not having enough water in your body (dehydration).  Illness.  Follow these instructions at home: Eating and drinking  Drink enough fluid to keep your pee (urine) clear or pale yellow. This helps to keep you from getting dehydrated. Try to drink more clear fluids, such as water.  Do not drink alcohol.  Limit how much caffeine you drink or eat, if your doctor tells you to do that.  Limit how much salt (sodium) you drink or eat, if your doctor tells you to do that. Activity  Avoid making quick movements. ? When you stand up from sitting in a chair, steady yourself until you feel okay. ? In the morning, first sit up on the side of the bed. When you feel okay, stand slowly while you hold onto something. Do this until you know that  your balance is fine.  If you need to stand in one place for a long time, move your legs often. Tighten and relax the muscles in your legs while you are standing.  Do not drive or use heavy machinery if you feel dizzy.  Avoid bending down if you feel dizzy. Place items in your home so you can reach them easily without leaning over. Lifestyle  Do not use any products that contain nicotine or tobacco, such as cigarettes and e-cigarettes. If you need help quitting, ask your doctor.  Try to lower your stress level. You can do this by using methods such as yoga or meditation. Talk with your doctor  if you need help. General instructions  Watch your dizziness for any changes.  Take over-the-counter and prescription medicines only as told by your doctor. Talk with your doctor if you think that you are dizzy because of a medicine that you are taking.  Tell a friend or a family member that you are feeling dizzy. If he or she notices any changes in your behavior, have this person call your doctor.  Keep all follow-up visits as told by your doctor. This is important. Contact a doctor if:  Your dizziness does not go away.  Your dizziness or light-headedness gets worse.  You feel sick to your stomach (nauseous).  You have trouble hearing.  You have new symptoms.  You are unsteady on your feet.  You feel like the room is spinning. Get help right away if:  You throw up (vomit) or have watery poop (diarrhea), and you cannot eat or drink anything.  You have trouble: ? Talking. ? Walking. ? Swallowing. ? Using your arms, hands, or legs.  You feel generally weak.  You are not thinking clearly, or you have trouble forming sentences. A friend or family member may notice this.  You have: ? Chest pain. ? Pain in your belly (abdomen). ? Shortness of breath. ? Sweating.  Your vision changes.  You are bleeding.  You have a very bad headache.  You have neck pain or a stiff  neck.  You have a fever. These symptoms may be an emergency. Do not wait to see if the symptoms will go away. Get medical help right away. Call your local emergency services (911 in the [place] Do not drive yourself to the hospital. Summary  Dizziness makes you feel unsteady or light-headed. You may feel like you are about to pass out (faint).  Drink enough fluid to keep your pee (urine) clear or pale yellow. Do not drink alcohol.  Avoid making quick movements if you feel dizzy.  Watch your dizziness for any changes. This information is not intended to replace advice given to you by your health care provider. Make sure you discuss any questions you have with your health care provider. Document Released: 03/01/2011 Document Revised: 03/29/2016 Document Reviewed: 03/29/2016 Elsevier Interactive Patient Education  2017 [employer].

## 2018-02-24 NOTE — Assessment & Plan Note (Signed)
Clinically stable.  Normal vital signs.  No red flag signs or symptoms.  Nonspecific symptom with large differential diagnosis.  Will check blood work today.  Fairly normal last May 2019.  Not orthostatic.  Blood pressure not an issue today.  No neurological signs or symptoms.  Advised to monitor symptoms and return here if no better or worse in the next few weeks.

## 2018-02-25 ENCOUNTER — Encounter: Payer: Self-pay | Admitting: *Deleted

## 2018-10-05 ENCOUNTER — Encounter: Payer: Self-pay | Admitting: Emergency Medicine

## 2018-10-06 NOTE — Telephone Encounter (Signed)
Needs OV. Lower the dose to 30 mg. Thanks.

## 2018-10-07 ENCOUNTER — Encounter: Payer: Self-pay | Admitting: Emergency Medicine

## 2018-10-07 ENCOUNTER — Other Ambulatory Visit: Payer: Self-pay

## 2018-10-07 ENCOUNTER — Ambulatory Visit (INDEPENDENT_AMBULATORY_CARE_PROVIDER_SITE_OTHER): Payer: BC Managed Care – PPO | Admitting: Emergency Medicine

## 2018-10-07 VITALS — BP 141/88 | HR 96 | Temp 97.6°F | Resp 16 | Ht 60.0 in | Wt 161.0 lb

## 2018-10-07 DIAGNOSIS — I10 Essential (primary) hypertension: Secondary | ICD-10-CM | POA: Diagnosis not present

## 2018-10-07 MED ORDER — AMLODIPINE BESYLATE 5 MG PO TABS: 5.0000 mg | ORAL_TABLET | Freq: Every day | ORAL | 3 refills | Status: DC

## 2018-10-07 NOTE — Patient Instructions (Addendum)
   If you have lab work done today you will be contacted with your lab results within the next 2 weeks.  If you have not heard from us then please contact us. The fastest way to get your results is to register for My Chart.   IF you received an x-ray today, you will receive an invoice from [facility]. Please contact [facility] at [phone] with questions or concerns regarding your invoice.   IF you received labwork today, you will receive an invoice from [facility]. Please contact [facility] at [phone] with questions or concerns regarding your invoice.   Our billing staff will not be able to assist you with questions regarding bills from these companies.  You will be contacted with the lab results as soon as they are available. The fastest way to get your results is to activate your My Chart account. Instructions are located on the last page of this paperwork. If you have not heard from us regarding the results in 2 weeks, please contact this office.     Hypertension, Adult High blood pressure (hypertension) is when the force of blood pumping through the arteries is too strong. The arteries are the blood vessels that carry blood from the heart throughout the body. Hypertension forces the heart to work harder to pump blood and may cause arteries to become narrow or stiff. Untreated or uncontrolled hypertension can cause a heart attack, heart failure, a stroke, kidney disease, and other problems. A blood pressure reading consists of a higher number over a lower number. Ideally, your blood pressure should be below 120/80. The first ("top") number is called the systolic pressure. It is a measure of the pressure in your arteries as your heart beats. The second ("bottom") number is called the diastolic pressure. It is a measure of the pressure in your arteries as the heart relaxes. What are the causes? The exact cause of this condition is not known. There are some conditions  that result in or are related to high blood pressure. What increases the risk? Some risk factors for high blood pressure are under your control. The following factors may make you more likely to develop this condition:  Smoking.  Having type 2 diabetes mellitus, high cholesterol, or both.  Not getting enough exercise or physical activity.  Being overweight.  Having too much fat, sugar, calories, or salt (sodium) in your diet.  Drinking too much alcohol. Some risk factors for high blood pressure may be difficult or impossible to change. Some of these factors include:  Having chronic kidney disease.  Having a family history of high blood pressure.  Age. Risk increases with age.  Race. You may be at higher risk if you are African American.  Gender. Men are at higher risk than women before age 45. After age 65, women are at higher risk than men.  Having obstructive sleep apnea.  Stress. What are the signs or symptoms? High blood pressure may not cause symptoms. Very high blood pressure (hypertensive crisis) may cause:  Headache.  Anxiety.  Shortness of breath.  Nosebleed.  Nausea and vomiting.  Vision changes.  Severe chest pain.  Seizures. How is this diagnosed? This condition is diagnosed by measuring your blood pressure while you are seated, with your arm resting on a flat surface, your legs uncrossed, and your feet flat on the floor. The cuff of the blood pressure monitor will be placed directly against the skin of your upper arm at the level of your heart.   It should be measured at least twice using the same arm. Certain conditions can cause a difference in blood pressure between your right and left arms. Certain factors can cause blood pressure readings to be lower or higher than normal for a short period of time:  When your blood pressure is higher when you are in a health care provider's office than when you are at home, this is called white coat hypertension.  Most people with this condition do not need medicines.  When your blood pressure is higher at home than when you are in a health care provider's office, this is called masked hypertension. Most people with this condition may need medicines to control blood pressure. If you have a high blood pressure reading during one visit or you have normal blood pressure with other risk factors, you may be asked to:  Return on a different day to have your blood pressure checked again.  Monitor your blood pressure at home for 1 week or longer. If you are diagnosed with hypertension, you may have other blood or imaging tests to help your health care provider understand your overall risk for other conditions. How is this treated? This condition is treated by making healthy lifestyle changes, such as eating healthy foods, exercising more, and reducing your alcohol intake. Your health care provider may prescribe medicine if lifestyle changes are not enough to get your blood pressure under control, and if:  Your systolic blood pressure is above 130.  Your diastolic blood pressure is above 80. Your personal target blood pressure may vary depending on your medical conditions, your age, and other factors. Follow these instructions at home: Eating and drinking   Eat a diet that is high in fiber and potassium, and low in sodium, added sugar, and fat. An example eating plan is called the DASH (Dietary Approaches to Stop Hypertension) diet. To eat this way: ? Eat plenty of fresh fruits and vegetables. Try to fill one half of your plate at each meal with fruits and vegetables. ? Eat whole grains, such as whole-wheat pasta, brown rice, or whole-grain bread. Fill about one fourth of your plate with whole grains. ? Eat or drink low-fat dairy products, such as skim milk or low-fat yogurt. ? Avoid fatty cuts of meat, processed or cured meats, and poultry with skin. Fill about one fourth of your plate with lean proteins, such  as fish, chicken without skin, beans, eggs, or tofu. ? Avoid pre-made and processed foods. These tend to be higher in sodium, added sugar, and fat.  Reduce your daily sodium intake. Most people with hypertension should eat less than 1,500 mg of sodium a day.  Do not drink alcohol if: ? Your health care provider tells you not to drink. ? You are pregnant, may be pregnant, or are planning to become pregnant.  If you drink alcohol: ? Limit how much you use to:  0-1 drink a day for women.  0-2 drinks a day for men. ? Be aware of how much alcohol is in your drink. In the [place] one drink equals one 12 oz bottle of beer (355 mL), one 5 oz glass of wine (148 mL), or one 1 oz glass of hard liquor (44 mL). Lifestyle   Work with your health care provider to maintain a healthy body weight or to lose weight. Ask what an ideal weight is for you.  Get at least 30 minutes of exercise most days of the week. Activities may include walking, swimming, or   biking.  Include exercise to strengthen your muscles (resistance exercise), such as Pilates or lifting weights, as part of your weekly exercise routine. Try to do these types of exercises for 30 minutes at least 3 days a week.  Do not use any products that contain nicotine or tobacco, such as cigarettes, e-cigarettes, and chewing tobacco. If you need help quitting, ask your health care provider.  Monitor your blood pressure at home as told by your health care provider.  Keep all follow-up visits as told by your health care provider. This is important. Medicines  Take over-the-counter and prescription medicines only as told by your health care provider. Follow directions carefully. Blood pressure medicines must be taken as prescribed.  Do not skip doses of blood pressure medicine. Doing this puts you at risk for problems and can make the medicine less effective.  Ask your health care provider about side effects or reactions to medicines that you  should watch for. Contact a health care provider if you:  Think you are having a reaction to a medicine you are taking.  Have headaches that keep coming back (recurring).  Feel dizzy.  Have swelling in your ankles.  Have trouble with your vision. Get help right away if you:  Develop a severe headache or confusion.  Have unusual weakness or numbness.  Feel faint.  Have severe pain in your chest or abdomen.  Vomit repeatedly.  Have trouble breathing. Summary  Hypertension is when the force of blood pumping through your arteries is too strong. If this condition is not controlled, it may put you at risk for serious complications.  Your personal target blood pressure may vary depending on your medical conditions, your age, and other factors. For most people, a normal blood pressure is less than 120/80.  Hypertension is treated with lifestyle changes, medicines, or a combination of both. Lifestyle changes include losing weight, eating a healthy, low-sodium diet, exercising more, and limiting alcohol. This information is not intended to replace advice given to you by your health care provider. Make sure you discuss any questions you have with your health care provider. Document Released: 03/12/2005 Document Revised: 11/20/2017 Document Reviewed: 11/20/2017 Elsevier Patient Education  2020 [employer]  

## 2018-10-07 NOTE — Assessment & Plan Note (Signed)
Will change blood pressure medication to amlodipine 5 mg a day.  Stop nifedipine SR 60 mg.  Follow-up in 6 months.

## 2018-10-07 NOTE — Progress Notes (Signed)
Emily Downs 38 y.o.   Chief Complaint  Patient presents with  . Hypertension    follow up/ pt would like a different dose of bp med.    HISTORY OF PRESENT ILLNESS: This is a 38 y.o. female with history of hypertension here for follow-up and medication review.  Was started on nifedipine SR 60 mg about 1 year ago after she developed hypertension during pregnancy.  Has been having episodes of intermittent symptomatic low blood pressure when she gets lightheaded and dizzy.  No syncopal episode.  Readings at home at times 90/60 when she is having symptoms.  No other complaints or medical concerns today.  HPI   Prior to Admission medications   Medication Sig Start Date End Date Taking? Authorizing Provider  FENUGREEK PO Take by mouth.   Yes [provider]  fluticasone Asencion Islam) 50 MCG/ACT nasal spray  07/05/14  Yes [provider]  Prenatal Vit-Fe Fumarate-FA (PRENATAL PLUS/IRON) 27-1 MG TABS Prenatal Plus (calcium carbonate) 27 mg iron-1 mg tablet   Yes [provider]  amLODipine (NORVASC) 5 MG tablet Take 1 tablet (5 mg total) by mouth daily. 10/07/18   Horald Pollen, MD  ibuprofen (ADVIL,MOTRIN) 600 MG tablet ibuprofen 600 mg tablet  TAKE 1 TABLET BY MOUTH EVERY 6 HOURS    [provider]    No Known Allergies  Patient Active Problem List   Diagnosis Date Noted  . Essential hypertension, benign 12/11/2013  . Menometrorrhagia   . Iron deficiency anemia     Past Medical History:  Diagnosis Date  . Allergy   . Beta thalassemia trait   . Genital herpes   . Gestational diabetes   . Hx of pre-eclampsia in prior pregnancy, currently pregnant   . Hypertension   . Iron deficiency anemia   . Menometrorrhagia     Past Surgical History:  Procedure Laterality Date  . DILATION AND EVACUATION  2009  . DILATION AND EVACUATION N/A 11/08/2015   Procedure: DILATATION AND EVACUATION WITH CHROMASOME STUDIES;  Surgeon: Servando Salina,  MD;  Location: [facility];  Service: Gynecology;  Laterality: N/A;    Social History   Socioeconomic History  . Marital status: Married    Spouse name: Codee Bloodworth  . Number of children: 1  . Years of education: MBA  . Highest education level: Not on file  Occupational History  . Occupation: Probation officer  . Occupation: residence Charity fundraiser    Comment: [employer] (04/23/2016)  Social Needs  . Financial resource strain: Not on file  . Food insecurity    Worry: Not on file    Inability: Not on file  . Transportation needs    Medical: Not on file    Non-medical: Not on file  Tobacco Use  . Smoking status: Never Smoker  . Smokeless tobacco: Never Used  Substance and Sexual Activity  . Alcohol use: No    Frequency: Never  . Drug use: No  . Sexual activity: Not Currently    Birth control/protection: None  Lifestyle  . Physical activity    Days per week: Not on file    Minutes per session: Not on file  . Stress: Not on file  Relationships  . Social Herbalist on phone: Not on file    Gets together: Not on file    Attends religious service: Not on file    Active member of club or organization: Not on file    Attends meetings of clubs or  organizations: Not on file    Relationship status: Not on file  . Intimate partner violence    Fear of current or ex partner: Not on file    Emotionally abused: Not on file    Physically abused: Not on file    Forced sexual activity: Not on file  Other Topics Concern  . Not on file  Social History Narrative   Marital status: married x 7 years      Children: 62 yo daughter.      Lives: with husband, daughter.      Employment:  Theme park manager, Residence Charity fundraiser      Tobacco; none      Alcohol: rare to none in 2018      Drugs:  None      Exercise: none in 2018      Seatbelt: 100%    Family History  Problem Relation Age of Onset  . Hypertension Mother   . Diabetes Mother   . Aneurysm Mother 8       brain aneurysm  .  Sarcoidosis Mother   . Hyperlipidemia Father   . Diabetes Maternal Grandmother   . Stroke Maternal Grandmother   . Cancer Paternal Grandmother   . Crohn's disease Sister   . Hypertension Sister      Review of Systems  Constitutional: Negative.  Negative for chills, fever and malaise/fatigue.  HENT: Negative.  Negative for congestion, nosebleeds and sore throat.   Eyes: Negative.   Respiratory: Negative.  Negative for cough and shortness of breath.   Cardiovascular: Negative.  Negative for chest pain and palpitations.  Gastrointestinal: Negative.  Negative for abdominal pain, blood in stool, constipation, diarrhea, nausea and vomiting.  Genitourinary: Negative.  Negative for dysuria and hematuria.  Musculoskeletal: Negative.  Negative for back pain, myalgias and neck pain.  Skin: Negative.  Negative for rash.  Neurological: Negative.  Negative for dizziness and headaches.  All other systems reviewed and are negative.  Vitals:   10/07/18 1146  BP: (!) 141/88  Pulse: 96  Resp: 16  Temp: 97.6 F (36.4 C)  SpO2: 98%     Physical Exam Vitals signs reviewed.  Constitutional:      Appearance: Normal appearance.  HENT:     Head: Normocephalic and atraumatic.  Eyes:     Extraocular Movements: Extraocular movements intact.     Conjunctiva/sclera: Conjunctivae normal.     Pupils: Pupils are equal, round, and reactive to light.  Neck:     Musculoskeletal: Normal range of motion and neck supple.  Cardiovascular:     Rate and Rhythm: Normal rate and regular rhythm.     Heart sounds: Normal heart sounds.  Pulmonary:     Effort: Pulmonary effort is normal.     Breath sounds: Normal breath sounds.  Musculoskeletal: Normal range of motion.  Skin:    General: Skin is warm and dry.     Capillary Refill: Capillary refill takes less than 2 seconds.  Neurological:     General: No focal deficit present.     Mental Status: She is alert and oriented to person, place, and time.      Sensory: No sensory deficit.     Motor: No weakness.  Psychiatric:        Mood and Affect: Mood normal.        Behavior: Behavior normal.      ASSESSMENT & PLAN: Essential hypertension, benign Will change blood pressure medication to amlodipine 5 mg a day.  Stop nifedipine  SR 60 mg.  Follow-up in 6 months.  Shalan was seen today for hypertension.  Diagnoses and all orders for this visit:  Essential hypertension, benign -     amLODipine (NORVASC) 5 MG tablet; Take 1 tablet (5 mg total) by mouth daily.    Patient Instructions       If you have lab work done today you will be contacted with your lab results within the next 2 weeks.  If you have not heard from [place] then please contact us. The fastest way to get your results is to register for My Chart.   IF you received an x-ray today, you will receive an invoice from [facility]. Please contact [facility] at [phone] with questions or concerns regarding your invoice.   IF you received labwork today, you will receive an invoice from [place]. Please contact [facility] at [phone] with questions or concerns regarding your invoice.   Our billing staff will not be able to assist you with questions regarding bills from these companies.  You will be contacted with the lab results as soon as they are available. The fastest way to get your results is to activate your My Chart account. Instructions are located on the last page of this paperwork. If you have not heard from [place] regarding the results in 2 weeks, please contact this office.      Hypertension, Adult High blood pressure (hypertension) is when the force of blood pumping through the arteries is too strong. The arteries are the blood vessels that carry blood from the heart throughout the body. Hypertension forces the heart to work harder to pump blood and may cause arteries to become narrow or stiff. Untreated or uncontrolled hypertension can cause a  heart attack, heart failure, a stroke, kidney disease, and other problems. A blood pressure reading consists of a higher number over a lower number. Ideally, your blood pressure should be below 120/80. The first ("top") number is called the systolic pressure. It is a measure of the pressure in your arteries as your heart beats. The second ("bottom") number is called the diastolic pressure. It is a measure of the pressure in your arteries as the heart relaxes. What are the causes? The exact cause of this condition is not known. There are some conditions that result in or are related to high blood pressure. What increases the risk? Some risk factors for high blood pressure are under your control. The following factors may make you more likely to develop this condition:  Smoking.  Having type 2 diabetes mellitus, high cholesterol, or both.  Not getting enough exercise or physical activity.  Being overweight.  Having too much fat, sugar, calories, or salt (sodium) in your diet.  Drinking too much alcohol. Some risk factors for high blood pressure may be difficult or impossible to change. Some of these factors include:  Having chronic kidney disease.  Having a family history of high blood pressure.  Age. Risk increases with age.  Race. You may be at higher risk if you are African American.  Gender. Men are at higher risk than women before age 61. After age 66, women are at higher risk than men.  Having obstructive sleep apnea.  Stress. What are the signs or symptoms? High blood pressure may not cause symptoms. Very high blood pressure (hypertensive crisis) may cause:  Headache.  Anxiety.  Shortness of breath.  Nosebleed.  Nausea and vomiting.  Vision changes.  Severe chest pain.  Seizures. How is this diagnosed?  This condition is diagnosed by measuring your blood pressure while you are seated, with your arm resting on a flat surface, your legs uncrossed, and your feet  flat on the floor. The cuff of the blood pressure monitor will be placed directly against the skin of your upper arm at the level of your heart. It should be measured at least twice using the same arm. Certain conditions can cause a difference in blood pressure between your right and left arms. Certain factors can cause blood pressure readings to be lower or higher than normal for a short period of time:  When your blood pressure is higher when you are in a health care provider's office than when you are at home, this is called white coat hypertension. Most people with this condition do not need medicines.  When your blood pressure is higher at home than when you are in a health care provider's office, this is called masked hypertension. Most people with this condition may need medicines to control blood pressure. If you have a high blood pressure reading during one visit or you have normal blood pressure with other risk factors, you may be asked to:  Return on a different day to have your blood pressure checked again.  Monitor your blood pressure at home for 1 week or longer. If you are diagnosed with hypertension, you may have other blood or imaging tests to help your health care provider understand your overall risk for other conditions. How is this treated? This condition is treated by making healthy lifestyle changes, such as eating healthy foods, exercising more, and reducing your alcohol intake. Your health care provider may prescribe medicine if lifestyle changes are not enough to get your blood pressure under control, and if:  Your systolic blood pressure is above 130.  Your diastolic blood pressure is above 80. Your personal target blood pressure may vary depending on your medical conditions, your age, and other factors. Follow these instructions at home: Eating and drinking   Eat a diet that is high in fiber and potassium, and low in sodium, added sugar, and fat. An example eating  plan is called the DASH (Dietary Approaches to Stop Hypertension) diet. To eat this way: ? Eat plenty of fresh fruits and vegetables. Try to fill one half of your plate at each meal with fruits and vegetables. ? Eat whole grains, such as whole-wheat pasta, brown rice, or whole-grain bread. Fill about one fourth of your plate with whole grains. ? Eat or drink low-fat dairy products, such as skim milk or low-fat yogurt. ? Avoid fatty cuts of meat, processed or cured meats, and poultry with skin. Fill about one fourth of your plate with lean proteins, such as fish, chicken without skin, beans, eggs, or tofu. ? Avoid pre-made and processed foods. These tend to be higher in sodium, added sugar, and fat.  Reduce your daily sodium intake. Most people with hypertension should eat less than 1,500 mg of sodium a day.  Do not drink alcohol if: ? Your health care provider tells you not to drink. ? You are pregnant, may be pregnant, or are planning to become pregnant.  If you drink alcohol: ? Limit how much you use to:  0-1 drink a day for women.  0-2 drinks a day for men. ? Be aware of how much alcohol is in your drink. In the [place] one drink equals one 12 oz bottle of beer (355 mL), one 5 oz glass of wine (148 mL), or  one 1 oz glass of hard liquor (44 mL). Lifestyle   Work with your health care provider to maintain a healthy body weight or to lose weight. Ask what an ideal weight is for you.  Get at least 30 minutes of exercise most days of the week. Activities may include walking, swimming, or biking.  Include exercise to strengthen your muscles (resistance exercise), such as Pilates or lifting weights, as part of your weekly exercise routine. Try to do these types of exercises for 30 minutes at least 3 days a week.  Do not use any products that contain nicotine or tobacco, such as cigarettes, e-cigarettes, and chewing tobacco. If you need help quitting, ask your health care provider.  Monitor  your blood pressure at home as told by your health care provider.  Keep all follow-up visits as told by your health care provider. This is important. Medicines  Take over-the-counter and prescription medicines only as told by your health care provider. Follow directions carefully. Blood pressure medicines must be taken as prescribed.  Do not skip doses of blood pressure medicine. Doing this puts you at risk for problems and can make the medicine less effective.  Ask your health care provider about side effects or reactions to medicines that you should watch for. Contact a health care provider if you:  Think you are having a reaction to a medicine you are taking.  Have headaches that keep coming back (recurring).  Feel dizzy.  Have swelling in your ankles.  Have trouble with your vision. Get help right away if you:  Develop a severe headache or confusion.  Have unusual weakness or numbness.  Feel faint.  Have severe pain in your chest or abdomen.  Vomit repeatedly.  Have trouble breathing. Summary  Hypertension is when the force of blood pumping through your arteries is too strong. If this condition is not controlled, it may put you at risk for serious complications.  Your personal target blood pressure may vary depending on your medical conditions, your age, and other factors. For most people, a normal blood pressure is less than 120/80.  Hypertension is treated with lifestyle changes, medicines, or a combination of both. Lifestyle changes include losing weight, eating a healthy, low-sodium diet, exercising more, and limiting alcohol. This information is not intended to replace advice given to you by your health care provider. Make sure you discuss any questions you have with your health care provider. Document Released: 03/12/2005 Document Revised: 11/20/2017 Document Reviewed: 11/20/2017 Elsevier Patient Education  2020 Elsevier Inc.      Agustina Caroli, MD  [facility]

## 2018-11-14 DIAGNOSIS — Z124 Encounter for screening for malignant neoplasm of cervix: Secondary | ICD-10-CM | POA: Diagnosis not present

## 2018-11-14 DIAGNOSIS — Z6831 Body mass index (BMI) 31.0-31.9, adult: Secondary | ICD-10-CM | POA: Diagnosis not present

## 2018-11-14 DIAGNOSIS — Z01419 Encounter for gynecological examination (general) (routine) without abnormal findings: Secondary | ICD-10-CM | POA: Diagnosis not present

## 2018-12-24 DIAGNOSIS — Z3201 Encounter for pregnancy test, result positive: Secondary | ICD-10-CM | POA: Diagnosis not present

## 2018-12-24 DIAGNOSIS — N925 Other specified irregular menstruation: Secondary | ICD-10-CM | POA: Diagnosis not present

## 2018-12-24 DIAGNOSIS — Z3480 Encounter for supervision of other normal pregnancy, unspecified trimester: Secondary | ICD-10-CM | POA: Diagnosis not present

## 2018-12-24 DIAGNOSIS — Z113 Encounter for screening for infections with a predominantly sexual mode of transmission: Secondary | ICD-10-CM | POA: Diagnosis not present

## 2018-12-24 DIAGNOSIS — Z6832 Body mass index (BMI) 32.0-32.9, adult: Secondary | ICD-10-CM | POA: Diagnosis not present

## 2018-12-24 DIAGNOSIS — Z23 Encounter for immunization: Secondary | ICD-10-CM | POA: Diagnosis not present

## 2018-12-24 DIAGNOSIS — I10 Essential (primary) hypertension: Secondary | ICD-10-CM | POA: Diagnosis not present

## 2018-12-24 LAB — OB RESULTS CONSOLE HIV ANTIBODY (ROUTINE TESTING): HIV: NONREACTIVE

## 2018-12-24 LAB — OB RESULTS CONSOLE GC/CHLAMYDIA
Chlamydia: NEGATIVE
Gonorrhea: NEGATIVE

## 2018-12-24 LAB — OB RESULTS CONSOLE ANTIBODY SCREEN: Antibody Screen: NEGATIVE

## 2018-12-24 LAB — OB RESULTS CONSOLE HEPATITIS B SURFACE ANTIGEN: Hepatitis B Surface Ag: NEGATIVE

## 2018-12-24 LAB — OB RESULTS CONSOLE RPR: RPR: NONREACTIVE

## 2018-12-24 LAB — OB RESULTS CONSOLE RUBELLA ANTIBODY, IGM: Rubella: IMMUNE

## 2019-01-22 DIAGNOSIS — Z369 Encounter for antenatal screening, unspecified: Secondary | ICD-10-CM | POA: Diagnosis not present

## 2019-01-22 DIAGNOSIS — Z3480 Encounter for supervision of other normal pregnancy, unspecified trimester: Secondary | ICD-10-CM | POA: Diagnosis not present

## 2019-01-29 DIAGNOSIS — R87810 Cervical high risk human papillomavirus (HPV) DNA test positive: Secondary | ICD-10-CM | POA: Diagnosis not present

## 2019-01-29 DIAGNOSIS — Z6832 Body mass index (BMI) 32.0-32.9, adult: Secondary | ICD-10-CM | POA: Diagnosis not present

## 2019-02-17 DIAGNOSIS — Z369 Encounter for antenatal screening, unspecified: Secondary | ICD-10-CM | POA: Diagnosis not present

## 2019-03-10 DIAGNOSIS — O09899 Supervision of other high risk pregnancies, unspecified trimester: Secondary | ICD-10-CM | POA: Diagnosis not present

## 2019-04-06 ENCOUNTER — Ambulatory Visit: Payer: BC Managed Care – PPO | Admitting: Emergency Medicine

## 2019-04-08 ENCOUNTER — Ambulatory Visit: Payer: BC Managed Care – PPO | Admitting: Emergency Medicine

## 2019-04-08 DIAGNOSIS — Z369 Encounter for antenatal screening, unspecified: Secondary | ICD-10-CM | POA: Diagnosis not present

## 2019-04-22 IMAGING — US US MFM OB FOLLOW-UP
1 series · 13 of 28 positions shown · non-contrast
Comparison: none

[Series 1: us mfm ob follow-up · 42 acquisitions, 13 frames shown]
[im 2/42]
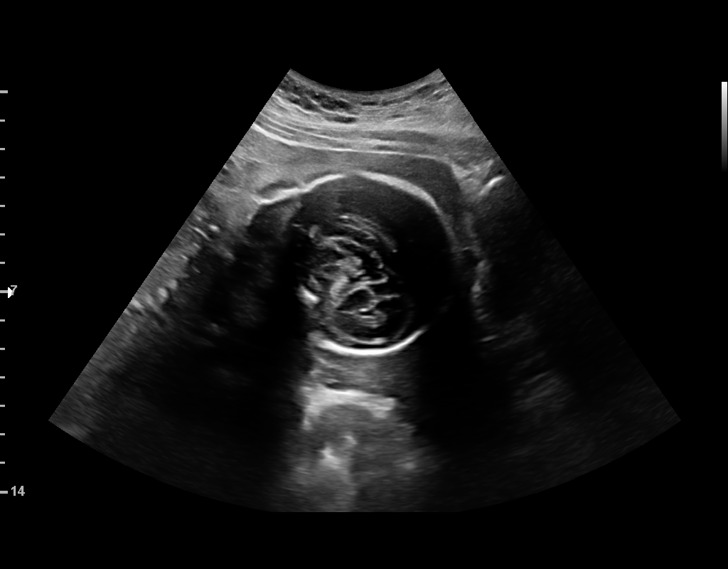
[im 5/42]
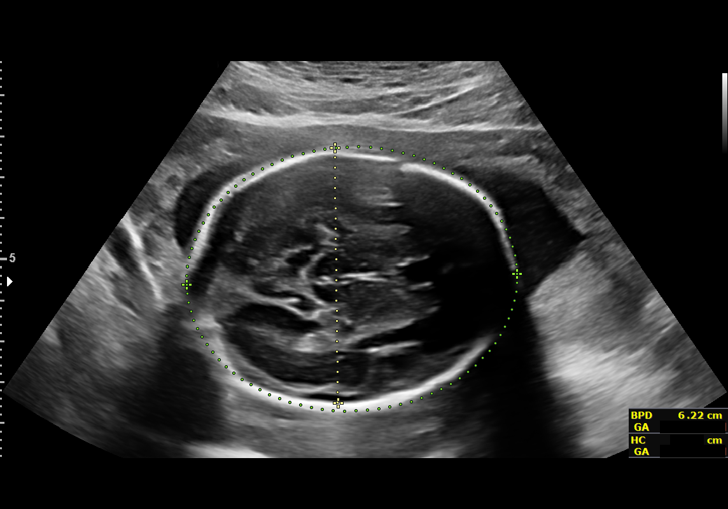
[im 8/42]
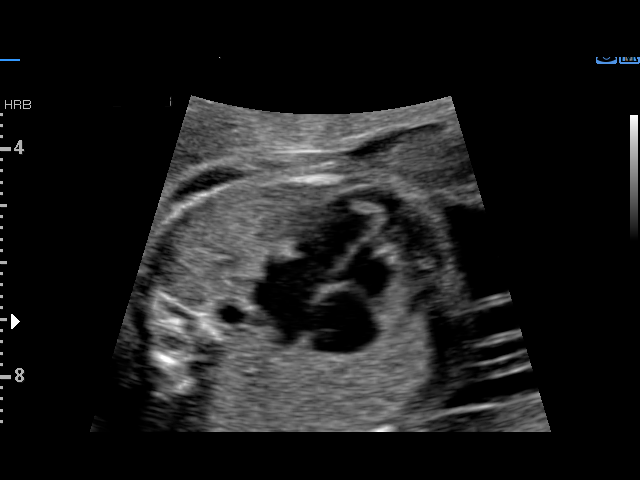
[im 11/42]
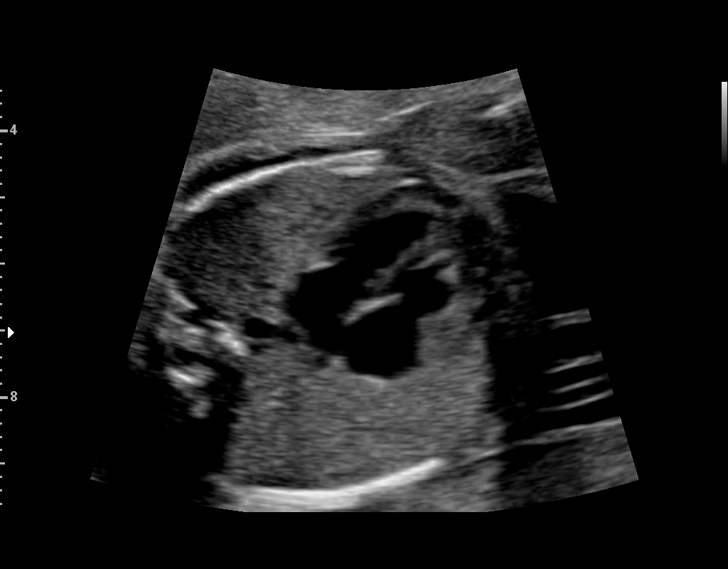
[im 14/42]
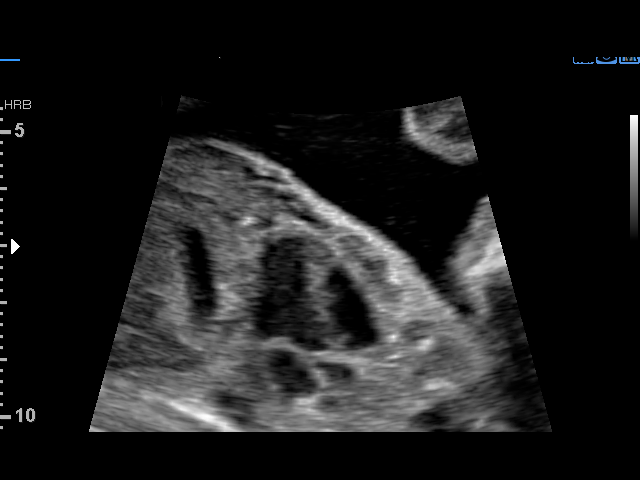
[im 17/42]
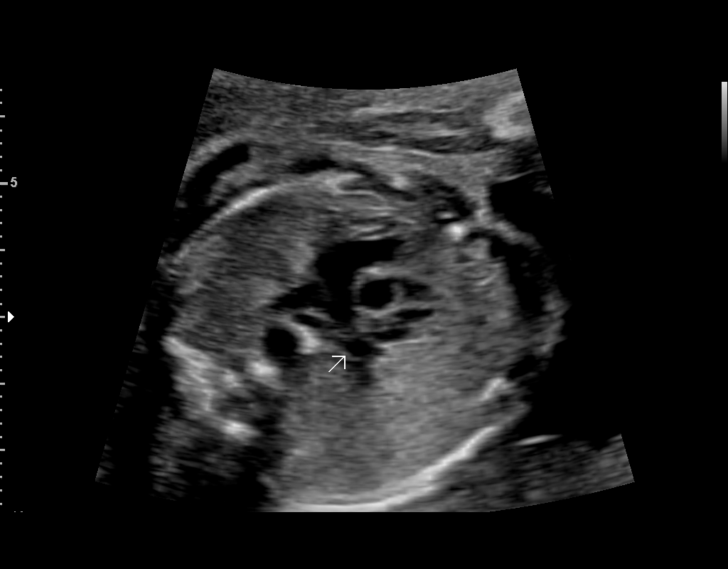
[im 22/42]
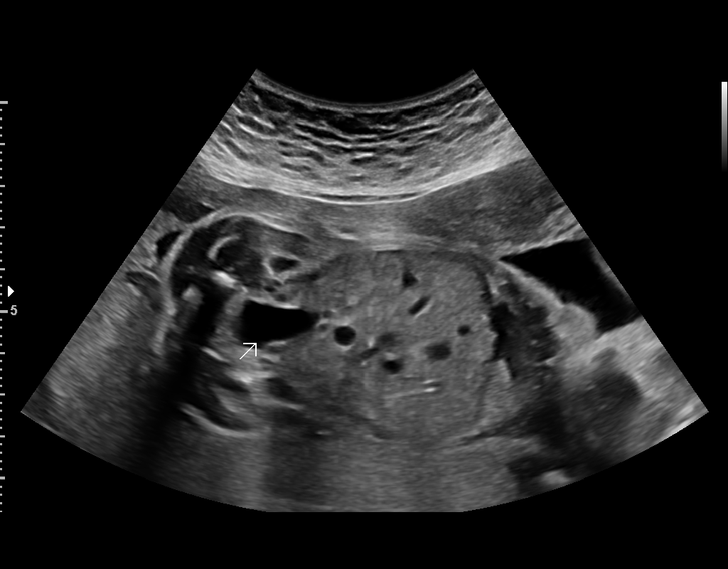
[im 25/42]
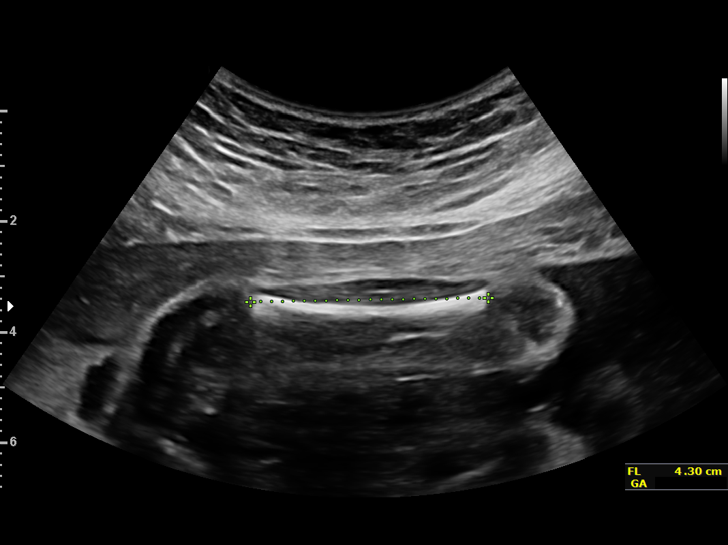
[im 28/42]
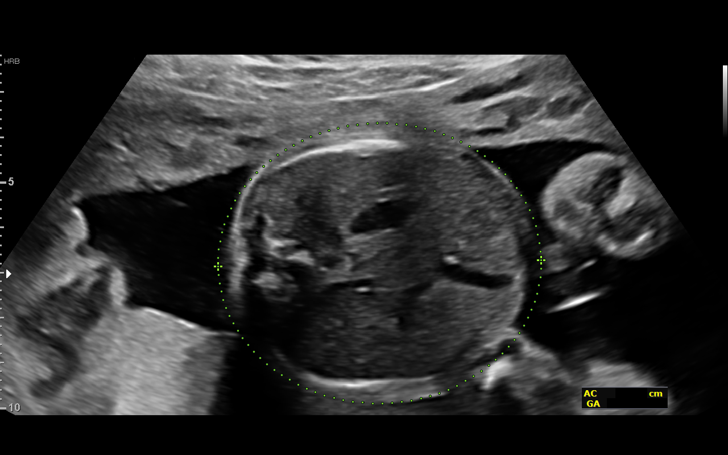
[im 31/42]
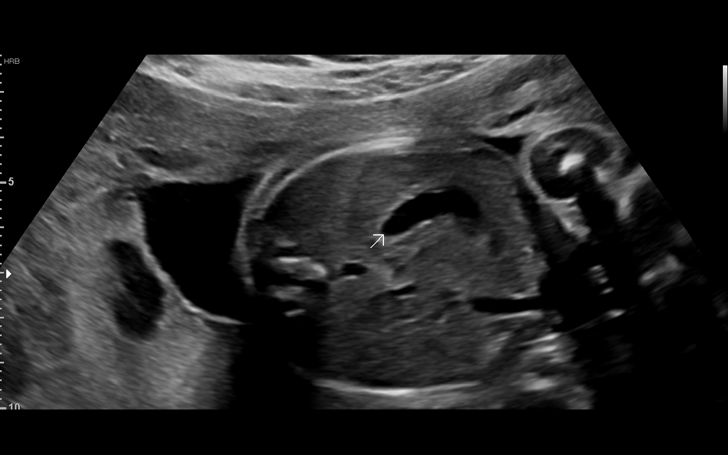
[im 34/42]
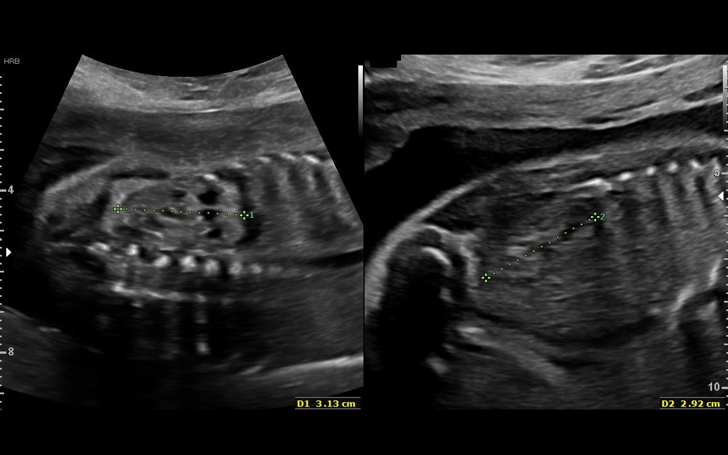
[im 37/42]
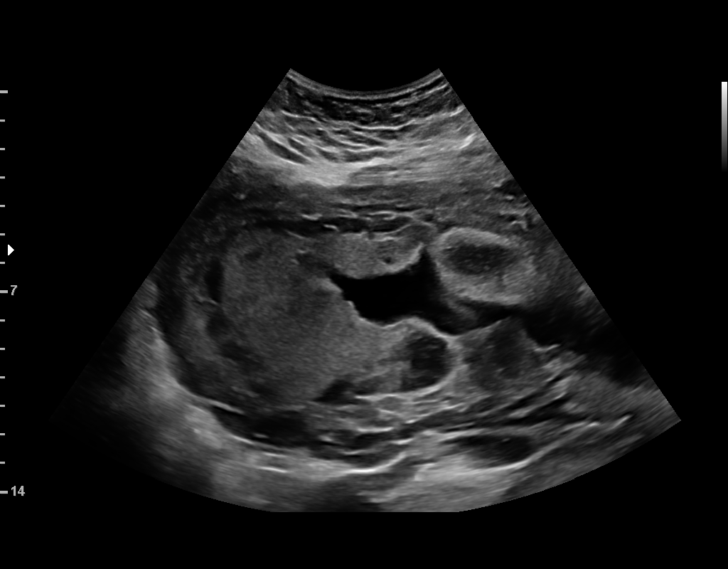
[im 40/42]
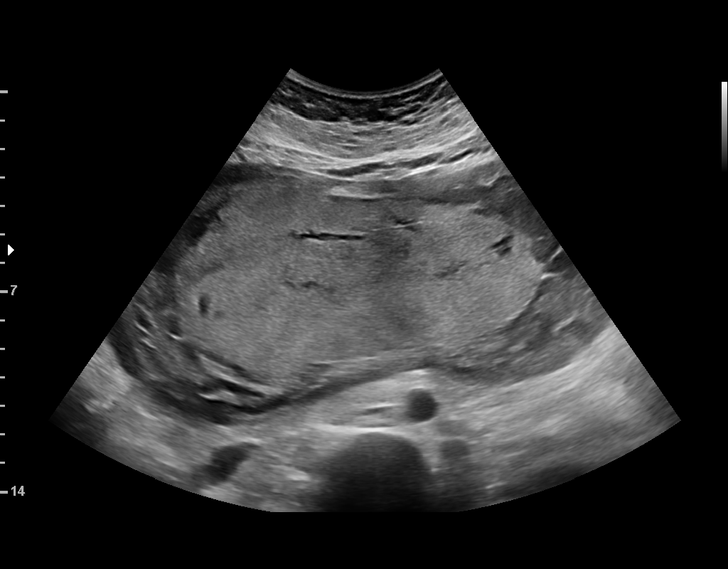

[13 of 28 positions shown; findings below may reference images not displayed]

OBGYN

1  YUVRAJ AUBIN              [id number]      [id number]     [id number]
Indications

25 weeks gestation of pregnancy
Advanced maternal age multigravida (36),
second trimester -NIPS no result; no further
testing
Obesity complicating pregnancy, second
trimester
Antenatal follow-up for nonvisualized fetal
anatomy
Hypertension - Chronic/Pre-existing (Toprol)
Uterine fibroids affecting pregnancy in        O34.12,
second trimester, antepartum
OB History

Blood Type:            Height:  5'0"   Weight (lb):  174       BMI:
Gravidity:    3         Term:   1        Prem:   0        SAB:   1
TOP:          0       Ectopic:  0        Living: 1
Fetal Evaluation

Num Of Fetuses:     1
Cardiac Activity:   Observed
Presentation:       Cephalic
Placenta:           Posterior, above cervical os
P. Cord Insertion:  Previously Visualized

Amniotic Fluid
AFI FV:      Subjectively within normal limits
Largest Pocket(cm)
4.54
Biometry

BPD:      61.8  mm     G. Age:  25w 1d         45  %    CI:        73.85   %    70 - 86
FL/HC:      19.2   %    18.7 -
HC:      228.4  mm     G. Age:  24w 6d         25  %    HC/AC:      1.08        1.04 -
AC:      210.7  mm     G. Age:  25w 4d         60  %    FL/BPD:     71.0   %    71 - 87
FL:       43.9  mm     G. Age:  24w 3d         21  %    FL/AC:      20.8   %    20 - 24
HUM:      41.3  mm     G. Age:  25w 0d         42  %

Est. FW:     768  gm    1 lb 11 oz      55  %
Gestational Age

LMP:           25w 0d        Date:  11/15/16                 EDD:   08/22/17
U/S Today:     25w 0d                                        EDD:   08/22/17
Best:          25w 0d     Det. By:  LMP  (11/15/16)          EDD:   08/22/17
Anatomy

Cranium:               Appears normal         Aortic Arch:            Previously seen
Cavum:                 Appears normal         Ductal Arch:            Previously seen
Ventricles:            Appears normal         Diaphragm:              Appears normal
Choroid Plexus:        Previously seen        Stomach:                Appears normal, left
sided
Cerebellum:            Previously seen        Abdomen:                Appears normal
Posterior Fossa:       Previously seen        Abdominal Wall:         Previously seen
Nuchal Fold:           Previously seen        Cord Vessels:           Previously seen
Face:                  Profile nl; orbits     Kidneys:                Appear normal
previously seen
Lips:                  Appears normal         Bladder:                Appears normal
Thoracic:              Appears normal         Spine:                  Previously seen
Heart:                 Appears normal         Upper Extremities:      Previously seen
(4CH, axis, and situs
RVOT:                  Appears normal         Lower Extremities:      Previously seen
LVOT:                  Appears normal

Other:  Male gender prev seen. Heels prev visualized. Nasal bone visualized.
Open hands visualized.
Cervix Uterus Adnexa

Cervix
Length:            3.8  cm.
Normal appearance by transabdominal scan.
Impression

SIUP at 25+0 weeks
Normal interval anatomy; anatomic survey complete
Normal amniotic fluid volume
Appropriate interval growth with EFW at the 55th %tile
Recommendations

Continue serial ultrasounds for growth (cHTN)
Antenatal testing at 32 weeks

## 2019-05-05 DIAGNOSIS — Z23 Encounter for immunization: Secondary | ICD-10-CM | POA: Diagnosis not present

## 2019-05-05 DIAGNOSIS — Z3480 Encounter for supervision of other normal pregnancy, unspecified trimester: Secondary | ICD-10-CM | POA: Diagnosis not present

## 2019-05-18 DIAGNOSIS — I10 Essential (primary) hypertension: Secondary | ICD-10-CM | POA: Diagnosis not present

## 2019-06-03 DIAGNOSIS — Z369 Encounter for antenatal screening, unspecified: Secondary | ICD-10-CM | POA: Diagnosis not present

## 2019-06-09 DIAGNOSIS — O368939 Maternal care for other specified fetal problems, third trimester, other fetus: Secondary | ICD-10-CM | POA: Diagnosis not present

## 2019-06-17 DIAGNOSIS — O09522 Supervision of elderly multigravida, second trimester: Secondary | ICD-10-CM | POA: Diagnosis not present

## 2019-06-24 DIAGNOSIS — O09523 Supervision of elderly multigravida, third trimester: Secondary | ICD-10-CM | POA: Diagnosis not present

## 2019-06-27 ENCOUNTER — Inpatient Hospital Stay (HOSPITAL_COMMUNITY)
Admission: AD | Admit: 2019-06-27 | Discharge: 2019-06-27 | Disposition: A | Discharge status: Home / Self Care | Payer: BC Managed Care – PPO | Attending: Obstetrics and Gynecology | Admitting: Obstetrics and Gynecology

## 2019-06-27 ENCOUNTER — Encounter: Payer: Self-pay | Admitting: Emergency Medicine

## 2019-06-27 ENCOUNTER — Encounter (HOSPITAL_COMMUNITY): Payer: Self-pay

## 2019-06-27 ENCOUNTER — Other Ambulatory Visit: Payer: Self-pay

## 2019-06-27 DIAGNOSIS — R42 Dizziness and giddiness: Secondary | ICD-10-CM | POA: Diagnosis not present

## 2019-06-27 DIAGNOSIS — R55 Syncope and collapse: Secondary | ICD-10-CM | POA: Diagnosis not present

## 2019-06-27 DIAGNOSIS — O99280 Endocrine, nutritional and metabolic diseases complicating pregnancy, unspecified trimester: Secondary | ICD-10-CM

## 2019-06-27 DIAGNOSIS — E860 Dehydration: Secondary | ICD-10-CM | POA: Diagnosis not present

## 2019-06-27 DIAGNOSIS — O26893 Other specified pregnancy related conditions, third trimester: Secondary | ICD-10-CM | POA: Diagnosis not present

## 2019-06-27 DIAGNOSIS — O10013 Pre-existing essential hypertension complicating pregnancy, third trimester: Secondary | ICD-10-CM | POA: Diagnosis not present

## 2019-06-27 DIAGNOSIS — Z3A34 34 weeks gestation of pregnancy: Secondary | ICD-10-CM | POA: Diagnosis not present

## 2019-06-27 DIAGNOSIS — R110 Nausea: Secondary | ICD-10-CM | POA: Diagnosis not present

## 2019-06-27 DIAGNOSIS — L2489 Irritant contact dermatitis due to other agents: Secondary | ICD-10-CM | POA: Diagnosis not present

## 2019-06-27 LAB — CBC WITH DIFFERENTIAL/PLATELET
Abs Immature Granulocytes: 0.05 10*3/uL (ref 0.00–0.07)
Basophils Absolute: 0 10*3/uL (ref 0.0–0.1)
Basophils Relative: 0 %
Eosinophils Absolute: 0.1 10*3/uL (ref 0.0–0.5)
Eosinophils Relative: 1 %
HCT: 37.1 % (ref 36.0–46.0)
Hemoglobin: 11.8 g/dL — ABNORMAL LOW (ref 12.0–15.0)
Immature Granulocytes: 1 %
Lymphocytes Relative: 17 %
Lymphs Abs: 1.6 10*3/uL (ref 0.7–4.0)
MCH: 23.9 pg — ABNORMAL LOW (ref 26.0–34.0)
MCHC: 31.8 g/dL (ref 30.0–36.0)
MCV: 75.1 fL — ABNORMAL LOW (ref 80.0–100.0)
Monocytes Absolute: 0.5 10*3/uL (ref 0.1–1.0)
Monocytes Relative: 6 %
Neutro Abs: 7 10*3/uL (ref 1.7–7.7)
Neutrophils Relative %: 75 %
Platelets: 208 10*3/uL (ref 150–400)
RBC: 4.94 MIL/uL (ref 3.87–5.11)
RDW: 17.6 % — ABNORMAL HIGH (ref 11.5–15.5)
WBC: 9.2 10*3/uL (ref 4.0–10.5)
nRBC: 0 % (ref 0.0–0.2)

## 2019-06-27 LAB — URINALYSIS, ROUTINE W REFLEX MICROSCOPIC
Bilirubin Urine: NEGATIVE
Glucose, UA: NEGATIVE mg/dL
Hgb urine dipstick: NEGATIVE
Ketones, ur: NEGATIVE mg/dL
Leukocytes,Ua: NEGATIVE
Nitrite: NEGATIVE
Protein, ur: 100 mg/dL — AB
Specific Gravity, Urine: 1.02 (ref 1.005–1.030)
pH: 6 (ref 5.0–8.0)

## 2019-06-27 LAB — COMPREHENSIVE METABOLIC PANEL
ALT: 25 U/L (ref 0–44)
AST: 31 U/L (ref 15–41)
Albumin: 2.5 g/dL — ABNORMAL LOW (ref 3.5–5.0)
Alkaline Phosphatase: 97 U/L (ref 38–126)
Anion gap: 8 (ref 5–15)
BUN: 8 mg/dL (ref 6–20)
CO2: 23 mmol/L (ref 22–32)
Calcium: 8.2 mg/dL — ABNORMAL LOW (ref 8.9–10.3)
Chloride: 106 mmol/L (ref 98–111)
Creatinine, Ser: 0.72 mg/dL (ref 0.44–1.00)
GFR calc Af Amer: >60 mL/min (ref >60)
GFR calc non Af Amer: >60 mL/min (ref >60)
Glucose, Bld: 122 mg/dL — ABNORMAL HIGH (ref 70–99)
Potassium: 4 mmol/L (ref 3.5–5.1)
Sodium: 137 mmol/L (ref 135–145)
Total Bilirubin: 0.2 mg/dL — ABNORMAL LOW (ref 0.3–1.2)
Total Protein: 5.8 g/dL — ABNORMAL LOW (ref 6.5–8.1)

## 2019-06-27 NOTE — MAU Note (Signed)
Pt reports to [facility] with c/o feeling dizzy upon waking up this morning.  Pt reports she tried to get something to eat but immediately felt nauseous and hot, so she was not able to eat.  Pt denies any ctx or LOF.  Reports good fetal movement.

## 2019-06-27 NOTE — MAU Provider Note (Signed)
History     CSN: [place]:[id number]  Arrival date and time: 06/27/19 [id number]   First Provider Initiated Contact with Patient 06/27/19 1002      Chief Complaint  Patient presents with  . Near Syncope   Ms.  Emily Downs is a 39 y.o. year old G21P2012 female at [redacted]w[redacted]d weeks gestation who presents to MAU via EMS reporting she felt dizzy when she woke up. She jumped up out of bed, because she "overslept for work". While she was trying to get food, she started feeling hot and nauseated. She denies pain, VB or LOF. She also reports good (+) FM. She has a h/o cHTN, but she forgot to take her medication before she left home for work this morning. She takes Amlodipine 5 mg daily; last taken yesterday. She last ate at 1900 on 06/26/2019 and drank some water this morning at 0700. She receives [facility]; next appt [facility] 07/02/2019. Patient reports CBG was 111 by EMS. IV bag of NS infusing upon arrival to MAU. Patient states she "now feels better since getting here."    OB History    Gravida  4   Para  2   Term  2   Preterm      AB  1   Living  2     SAB  1   TAB      Ectopic      Multiple      Live Births  2           Past Medical History:  Diagnosis Date  . Allergy   . Beta thalassemia trait   . Genital herpes   . Gestational diabetes   . Hx of pre-eclampsia in prior pregnancy, currently pregnant   . Hypertension   . Iron deficiency anemia   . Menometrorrhagia     Past Surgical History:  Procedure Laterality Date  . DILATION AND EVACUATION  2009  . DILATION AND EVACUATION N/A 11/08/2015   Procedure: DILATATION AND EVACUATION WITH CHROMASOME STUDIES;  Surgeon: Servando Salina, MD;  Location: [facility];  Service: Gynecology;  Laterality: N/A;    Family History  Problem Relation Age of Onset  . Hypertension Mother   . Diabetes Mother   . Aneurysm Mother 21       brain aneurysm  . Sarcoidosis Mother   . Hyperlipidemia Father   . Diabetes Maternal  Grandmother   . Stroke Maternal Grandmother   . Cancer Paternal Grandmother   . Crohn's disease Sister   . Hypertension Sister     Social History   Tobacco Use  . Smoking status: Never Smoker  . Smokeless tobacco: Never Used  Substance Use Topics  . Alcohol use: No  . Drug use: No    Allergies: No Known Allergies  Medications Prior to Admission  Medication Sig Dispense Refill Last Dose  . amLODipine (NORVASC) 5 MG tablet Take 1 tablet (5 mg total) by mouth daily. 90 tablet 3 06/26/2019 at Unknown time  . FENUGREEK PO Take by mouth.     . fluticasone (FLONASE) 50 MCG/ACT nasal spray      . ibuprofen (ADVIL,MOTRIN) 600 MG tablet ibuprofen 600 mg tablet  TAKE 1 TABLET BY MOUTH EVERY 6 HOURS     . Prenatal Vit-Fe Fumarate-FA (PRENATAL PLUS/IRON) 27-1 MG TABS Prenatal Plus (calcium carbonate) 27 mg iron-1 mg tablet       Review of Systems  Constitutional: Negative.  Negative for diaphoresis.  HENT: Negative.  Eyes: Negative.   Respiratory: Negative.   Cardiovascular: Negative.   Gastrointestinal: Negative.  Negative for nausea.  Endocrine: Negative.   Genitourinary: Negative.   Musculoskeletal: Negative.   Allergic/Immunologic: Negative.   Neurological: Negative.  Negative for dizziness, syncope and light-headedness.  Hematological: Negative.   Psychiatric/Behavioral: Negative.    Physical Exam   Blood pressure 130/84, pulse 92, temperature 98 F (36.7 C), temperature source Oral, resp. rate 16, SpO2 100 %.  Physical Exam  Nursing note and vitals reviewed. Constitutional: She is oriented to person, place, and time. She appears well-developed and well-nourished.  HENT:  Head: Normocephalic and atraumatic.  Eyes: Pupils are equal, round, and reactive to light.  Cardiovascular: Normal rate.  Respiratory: Effort normal.  GI: Soft.  Genitourinary:    Genitourinary Comments: Not indicated   Musculoskeletal:        General: Normal range of motion.     Cervical back:  Normal range of motion.  Neurological: She is alert and oriented to person, place, and time.  Skin: Skin is warm and dry.  Psychiatric: She has a normal mood and affect. Her behavior is normal. Judgment and thought content normal.   NST - FHR: 135 bpm / moderate variability / accels present / decels absent / TOCO: UI noted  MAU Course  Procedures  MDM CCUA CBC w/Diff CMP  Results for orders placed or performed during the hospital encounter of 06/27/19 (from the past 48 hour(s))  Urinalysis, Routine w reflex microscopic     Status: Abnormal   Collection Time: 06/27/19  9:40 AM  Result Value Ref Range   Color, Urine AMBER (A) YELLOW    Comment: BIOCHEMICALS MAY BE AFFECTED BY COLOR   APPearance HAZY (A) CLEAR   Specific Gravity, Urine 1.020 1.005 - 1.030   pH 6.0 5.0 - 8.0   Glucose, UA NEGATIVE NEGATIVE mg/dL   Hgb urine dipstick NEGATIVE NEGATIVE   Bilirubin Urine NEGATIVE NEGATIVE   Ketones, ur NEGATIVE NEGATIVE mg/dL   Protein, ur 100 (A) NEGATIVE mg/dL   Nitrite NEGATIVE NEGATIVE   Leukocytes,Ua NEGATIVE NEGATIVE   RBC / HPF 0-5 0 - 5 RBC/hpf   WBC, UA 6-10 0 - 5 WBC/hpf   Bacteria, UA MANY (A) NONE SEEN   Squamous Epithelial / LPF 6-10 0 - 5   Mucus PRESENT    Hyaline Casts, UA PRESENT    Non Squamous Epithelial 0-5 (A) NONE SEEN    Comment: Performed at [facility], [address] [place], [place] [address]  CBC with Differential/Platelet     Status: Abnormal   Collection Time: 06/27/19 11:06 AM  Result Value Ref Range   WBC 9.2 4.0 - 10.5 K/uL   RBC 4.94 3.87 - 5.11 MIL/uL   Hemoglobin 11.8 (L) 12.0 - 15.0 g/dL   HCT 37.1 36.0 - 46.0 %   MCV 75.1 (L) 80.0 - 100.0 fL   MCH 23.9 (L) 26.0 - 34.0 pg   MCHC 31.8 30.0 - 36.0 g/dL   RDW 17.6 (H) 11.5 - 15.5 %   Platelets 208 150 - 400 K/uL    Comment: REPEATED TO VERIFY SPECIMEN CHECKED FOR CLOTS    nRBC 0.0 0.0 - 0.2 %   Neutrophils Relative % 75 %   Neutro Abs 7.0 1.7 - 7.7 K/uL   Lymphocytes Relative  17 %   Lymphs Abs 1.6 0.7 - 4.0 K/uL   Monocytes Relative 6 %   Monocytes Absolute 0.5 0.1 - 1.0 K/uL  Eosinophils Relative 1 %   Eosinophils Absolute 0.1 0.0 - 0.5 K/uL   Basophils Relative 0 %   Basophils Absolute 0.0 0.0 - 0.1 K/uL   Immature Granulocytes 1 %   Abs Immature Granulocytes 0.05 0.00 - 0.07 K/uL    Comment: Performed at [facility] [address], [place], [place] [address]  Comprehensive metabolic panel     Status: Abnormal   Collection Time: 06/27/19 11:06 AM  Result Value Ref Range   Sodium 137 135 - 145 mmol/L   Potassium 4.0 3.5 - 5.1 mmol/L   Chloride 106 98 - 111 mmol/L   CO2 23 22 - 32 mmol/L   Glucose, Bld 122 (H) 70 - 99 mg/dL    Comment: Glucose reference range applies only to samples taken after fasting for at least 8 hours.   BUN 8 6 - 20 mg/dL   Creatinine, Ser 0.72 0.44 - 1.00 mg/dL   Calcium 8.2 (L) 8.9 - 10.3 mg/dL   Total Protein 5.8 (L) 6.5 - 8.1 g/dL   Albumin 2.5 (L) 3.5 - 5.0 g/dL   AST 31 15 - 41 U/L   ALT 25 0 - 44 U/L   Alkaline Phosphatase 97 38 - 126 U/L   Total Bilirubin 0.2 (L) 0.3 - 1.2 mg/dL   GFR calc non Af Amer >60 >60 mL/min   GFR calc Af Amer >60 >60 mL/min   Anion gap 8 5 - 15    Comment: Performed at [facility] [address], [place], [place] [address]     Assessment and Plan  Near syncope  - Information provided on near syncope - Advised to make sure she eats a protein and complex carbohydrate before bed - Try to arise from laying more slowly - Stay well-hydrated   Dehydration during pregnancy  - Information provided on dehydration & rehydration  - Stay well-hydrated  - Discharge patient - Keep scheduled appt with [facility] on 07/02/2019 - Patient verbalized an understanding of the plan of care and agrees.      Laury Deep, MSN, CNM 06/27/2019, 10:02 AM

## 2019-06-29 NOTE — Telephone Encounter (Signed)
Pt wanting to know if she should be concerned about her lab results, what can I tell her about them?

## 2019-06-30 NOTE — Telephone Encounter (Signed)
She is on her third trimester of pregnancy.  Has a history of hypertension and history of preeclampsia.  She has an appointment to follow-up with her OB/GYN doctor on 07/02/2019.  I do not see anything terribly abnormal with her recent labs.  No concerns on my part.  However she must closely follow-up with her OB/GYN doctor.  Thanks.

## 2019-07-02 DIAGNOSIS — O09522 Supervision of elderly multigravida, second trimester: Secondary | ICD-10-CM | POA: Diagnosis not present

## 2019-07-02 DIAGNOSIS — Z3480 Encounter for supervision of other normal pregnancy, unspecified trimester: Secondary | ICD-10-CM | POA: Diagnosis not present

## 2019-07-09 DIAGNOSIS — O24419 Gestational diabetes mellitus in pregnancy, unspecified control: Secondary | ICD-10-CM | POA: Diagnosis not present

## 2019-07-09 DIAGNOSIS — O09522 Supervision of elderly multigravida, second trimester: Secondary | ICD-10-CM | POA: Diagnosis not present

## 2019-07-13 DIAGNOSIS — O10019 Pre-existing essential hypertension complicating pregnancy, unspecified trimester: Secondary | ICD-10-CM | POA: Diagnosis not present

## 2019-07-13 DIAGNOSIS — O09523 Supervision of elderly multigravida, third trimester: Secondary | ICD-10-CM | POA: Diagnosis not present

## 2019-07-14 ENCOUNTER — Encounter (HOSPITAL_COMMUNITY): Payer: Self-pay | Admitting: *Deleted

## 2019-07-14 ENCOUNTER — Telehealth (HOSPITAL_COMMUNITY): Payer: Self-pay | Admitting: *Deleted

## 2019-07-14 NOTE — Telephone Encounter (Signed)
Preadmission screen  

## 2019-07-16 DIAGNOSIS — O09523 Supervision of elderly multigravida, third trimester: Secondary | ICD-10-CM | POA: Diagnosis not present

## 2019-07-16 DIAGNOSIS — O10019 Pre-existing essential hypertension complicating pregnancy, unspecified trimester: Secondary | ICD-10-CM | POA: Diagnosis not present

## 2019-07-16 DIAGNOSIS — N760 Acute vaginitis: Secondary | ICD-10-CM | POA: Diagnosis not present

## 2019-07-21 ENCOUNTER — Other Ambulatory Visit (HOSPITAL_COMMUNITY)
Admission: RE | Admit: 2019-07-21 | Discharge: 2019-07-21 | Disposition: A | Discharge status: Home / Self Care | Payer: BC Managed Care – PPO | Source: Ambulatory Visit | Attending: Obstetrics | Admitting: Obstetrics

## 2019-07-21 DIAGNOSIS — O99214 Obesity complicating childbirth: Secondary | ICD-10-CM | POA: Diagnosis not present

## 2019-07-21 DIAGNOSIS — Z01812 Encounter for preprocedural laboratory examination: Secondary | ICD-10-CM | POA: Insufficient documentation

## 2019-07-21 DIAGNOSIS — O9832 Other infections with a predominantly sexual mode of transmission complicating childbirth: Secondary | ICD-10-CM | POA: Diagnosis not present

## 2019-07-21 DIAGNOSIS — A6000 Herpesviral infection of urogenital system, unspecified: Secondary | ICD-10-CM | POA: Diagnosis not present

## 2019-07-21 DIAGNOSIS — E669 Obesity, unspecified: Secondary | ICD-10-CM | POA: Diagnosis not present

## 2019-07-21 DIAGNOSIS — Z3A38 38 weeks gestation of pregnancy: Secondary | ICD-10-CM | POA: Diagnosis not present

## 2019-07-21 DIAGNOSIS — Z20822 Contact with and (suspected) exposure to covid-19: Secondary | ICD-10-CM | POA: Diagnosis not present

## 2019-07-21 DIAGNOSIS — Z23 Encounter for immunization: Secondary | ICD-10-CM | POA: Diagnosis not present

## 2019-07-21 DIAGNOSIS — I10 Essential (primary) hypertension: Secondary | ICD-10-CM | POA: Diagnosis not present

## 2019-07-21 DIAGNOSIS — O1002 Pre-existing essential hypertension complicating childbirth: Secondary | ICD-10-CM | POA: Diagnosis not present

## 2019-07-21 DIAGNOSIS — O1092 Unspecified pre-existing hypertension complicating childbirth: Secondary | ICD-10-CM | POA: Diagnosis not present

## 2019-07-21 DIAGNOSIS — O09523 Supervision of elderly multigravida, third trimester: Secondary | ICD-10-CM | POA: Diagnosis not present

## 2019-07-21 DIAGNOSIS — O403XX0 Polyhydramnios, third trimester, not applicable or unspecified: Secondary | ICD-10-CM | POA: Diagnosis not present

## 2019-07-21 LAB — SARS CORONAVIRUS 2 (TAT 6-24 HRS): SARS Coronavirus 2: NEGATIVE

## 2019-07-23 ENCOUNTER — Inpatient Hospital Stay (HOSPITAL_COMMUNITY): Payer: BC Managed Care – PPO

## 2019-07-23 ENCOUNTER — Inpatient Hospital Stay (HOSPITAL_COMMUNITY): Payer: BC Managed Care – PPO | Admitting: Anesthesiology

## 2019-07-23 ENCOUNTER — Encounter (HOSPITAL_COMMUNITY)
Admission: AD | Disposition: A | Discharge status: Home / Self Care | Payer: Self-pay | Source: Home / Self Care | Attending: Obstetrics

## 2019-07-23 ENCOUNTER — Other Ambulatory Visit: Payer: Self-pay

## 2019-07-23 ENCOUNTER — Inpatient Hospital Stay (HOSPITAL_COMMUNITY)
Admission: AD | Admit: 2019-07-23 | Discharge: 2019-07-26 | DRG: 787 | Disposition: A | Discharge status: Home / Self Care | Payer: BC Managed Care – PPO | Attending: Obstetrics | Admitting: Obstetrics

## 2019-07-23 ENCOUNTER — Encounter (HOSPITAL_COMMUNITY): Payer: Self-pay | Admitting: Obstetrics

## 2019-07-23 DIAGNOSIS — O99214 Obesity complicating childbirth: Secondary | ICD-10-CM | POA: Diagnosis present

## 2019-07-23 DIAGNOSIS — A6000 Herpesviral infection of urogenital system, unspecified: Secondary | ICD-10-CM | POA: Diagnosis present

## 2019-07-23 DIAGNOSIS — O1002 Pre-existing essential hypertension complicating childbirth: Secondary | ICD-10-CM | POA: Diagnosis present

## 2019-07-23 DIAGNOSIS — O403XX0 Polyhydramnios, third trimester, not applicable or unspecified: Secondary | ICD-10-CM | POA: Diagnosis present

## 2019-07-23 DIAGNOSIS — O10013 Pre-existing essential hypertension complicating pregnancy, third trimester: Secondary | ICD-10-CM | POA: Diagnosis present

## 2019-07-23 DIAGNOSIS — Z3A38 38 weeks gestation of pregnancy: Secondary | ICD-10-CM

## 2019-07-23 DIAGNOSIS — O9832 Other infections with a predominantly sexual mode of transmission complicating childbirth: Secondary | ICD-10-CM | POA: Diagnosis present

## 2019-07-23 DIAGNOSIS — E669 Obesity, unspecified: Secondary | ICD-10-CM | POA: Diagnosis present

## 2019-07-23 DIAGNOSIS — I10 Essential (primary) hypertension: Secondary | ICD-10-CM

## 2019-07-23 DIAGNOSIS — Z20822 Contact with and (suspected) exposure to covid-19: Secondary | ICD-10-CM | POA: Diagnosis present

## 2019-07-23 DIAGNOSIS — Z98891 History of uterine scar from previous surgery: Secondary | ICD-10-CM

## 2019-07-23 DIAGNOSIS — O09523 Supervision of elderly multigravida, third trimester: Secondary | ICD-10-CM | POA: Diagnosis not present

## 2019-07-23 DIAGNOSIS — O1092 Unspecified pre-existing hypertension complicating childbirth: Secondary | ICD-10-CM | POA: Diagnosis not present

## 2019-07-23 DIAGNOSIS — Z23 Encounter for immunization: Secondary | ICD-10-CM | POA: Diagnosis not present

## 2019-07-23 LAB — CBC
HCT: 38.7 % (ref 36.0–46.0)
HCT: 41.3 % (ref 36.0–46.0)
Hemoglobin: 12.5 g/dL (ref 12.0–15.0)
Hemoglobin: 13.1 g/dL (ref 12.0–15.0)
MCH: 24 pg — ABNORMAL LOW (ref 26.0–34.0)
MCH: 23.5 pg — ABNORMAL LOW (ref 26.0–34.0)
MCHC: 32.3 g/dL (ref 30.0–36.0)
MCHC: 31.7 g/dL (ref 30.0–36.0)
MCV: 74.4 fL — ABNORMAL LOW (ref 80.0–100.0)
MCV: 74.1 fL — ABNORMAL LOW (ref 80.0–100.0)
Platelets: 209 10*3/uL (ref 150–400)
Platelets: 210 10*3/uL (ref 150–400)
RBC: 5.2 MIL/uL — ABNORMAL HIGH (ref 3.87–5.11)
RBC: 5.57 MIL/uL — ABNORMAL HIGH (ref 3.87–5.11)
RDW: 17.9 % — ABNORMAL HIGH (ref 11.5–15.5)
RDW: 18.2 % — ABNORMAL HIGH (ref 11.5–15.5)
WBC: 7.8 10*3/uL (ref 4.0–10.5)
WBC: 8.7 10*3/uL (ref 4.0–10.5)
nRBC: 0 % (ref 0.0–0.2)
nRBC: 0 % (ref 0.0–0.2)

## 2019-07-23 LAB — COMPREHENSIVE METABOLIC PANEL
ALT: 32 U/L (ref 0–44)
AST: 41 U/L (ref 15–41)
Albumin: 2.7 g/dL — ABNORMAL LOW (ref 3.5–5.0)
Alkaline Phosphatase: 142 U/L — ABNORMAL HIGH (ref 38–126)
Anion gap: 11 (ref 5–15)
BUN: 10 mg/dL (ref 6–20)
CO2: 21 mmol/L — ABNORMAL LOW (ref 22–32)
Calcium: 9.4 mg/dL (ref 8.9–10.3)
Chloride: 106 mmol/L (ref 98–111)
Creatinine, Ser: 0.74 mg/dL (ref 0.44–1.00)
GFR calc Af Amer: >60 mL/min (ref >60)
GFR calc non Af Amer: >60 mL/min (ref >60)
Glucose, Bld: 102 mg/dL — ABNORMAL HIGH (ref 70–99)
Potassium: 3.7 mmol/L (ref 3.5–5.1)
Sodium: 138 mmol/L (ref 135–145)
Total Bilirubin: 0.5 mg/dL (ref 0.3–1.2)
Total Protein: 6.6 g/dL (ref 6.5–8.1)

## 2019-07-23 LAB — TYPE AND SCREEN
ABO/RH(D): B POS
Antibody Screen: NEGATIVE

## 2019-07-23 LAB — RPR: RPR Ser Ql: NONREACTIVE

## 2019-07-23 LAB — ABO/RH: ABO/RH(D): B POS

## 2019-07-23 SURGERY — Surgical Case
Anesthesia: Epidural

## 2019-07-23 MED ORDER — AMLODIPINE BESYLATE 5 MG PO TABS
5.0000 mg | ORAL_TABLET | Freq: Every day | ORAL | Status: DC
  Administered 2019-07-24 – 2019-07-26 (×3): 5 mg via ORAL
  Filled 2019-07-23 (×4): qty 1

## 2019-07-23 MED ORDER — PHENYLEPHRINE 40 MCG/ML (10ML) SYRINGE FOR IV PUSH (FOR BLOOD PRESSURE SUPPORT)
PREFILLED_SYRINGE | INTRAVENOUS | Status: AC
  Filled 2019-07-23: qty 10

## 2019-07-23 MED ORDER — LIDOCAINE-EPINEPHRINE (PF) 2 %-1:200000 IJ SOLN
INTRAMUSCULAR | Status: DC | PRN
  Administered 2019-07-23 (×2): 5 mL via EPIDURAL
  Administered 2019-07-23: 2 mL via EPIDURAL

## 2019-07-23 MED ORDER — LACTATED RINGERS IV SOLN: INTRAVENOUS | Status: DC

## 2019-07-23 MED ORDER — ONDANSETRON HCL 4 MG/2ML IJ SOLN
INTRAMUSCULAR | Status: AC
  Filled 2019-07-23: qty 2

## 2019-07-23 MED ORDER — HYDROMORPHONE HCL 1 MG/ML IJ SOLN: 0.2500 mg | INTRAMUSCULAR | Status: DC | PRN

## 2019-07-23 MED ORDER — OXYCODONE HCL 5 MG PO TABS
5.0000 mg | ORAL_TABLET | ORAL | Status: DC | PRN
  Administered 2019-07-25 – 2019-07-26 (×5): 5 mg via ORAL
  Filled 2019-07-23 (×6): qty 1

## 2019-07-23 MED ORDER — OXYCODONE HCL 5 MG/5ML PO SOLN: 5.0000 mg | Freq: Once | ORAL | Status: DC | PRN

## 2019-07-23 MED ORDER — CEFAZOLIN SODIUM-DEXTROSE 2-4 GM/100ML-% IV SOLN: 2.0000 g | Freq: Once | INTRAVENOUS | Status: AC

## 2019-07-23 MED ORDER — IBUPROFEN 800 MG PO TABS
800.0000 mg | ORAL_TABLET | Freq: Four times a day (QID) | ORAL | Status: DC
  Administered 2019-07-24 – 2019-07-26 (×9): 800 mg via ORAL
  Filled 2019-07-23 (×10): qty 1

## 2019-07-23 MED ORDER — FENTANYL-BUPIVACAINE-NACL 0.5-0.125-0.9 MG/250ML-% EP SOLN
12.0000 mL/h | EPIDURAL | Status: DC | PRN
  Filled 2019-07-23: qty 250

## 2019-07-23 MED ORDER — ONDANSETRON HCL 4 MG/2ML IJ SOLN
INTRAMUSCULAR | Status: DC | PRN
Start: 2019-07-23 — End: 2019-07-23
  Administered 2019-07-23: 4 mg via INTRAVENOUS

## 2019-07-23 MED ORDER — PHENYLEPHRINE HCL (PRESSORS) 10 MG/ML IV SOLN
INTRAVENOUS | Status: DC | PRN
  Administered 2019-07-23: 80 ug via INTRAVENOUS
  Administered 2019-07-23: 120 ug via INTRAVENOUS
  Administered 2019-07-23: 40 ug via INTRAVENOUS
  Administered 2019-07-23: 120 ug via INTRAVENOUS
  Administered 2019-07-23: 80 ug via INTRAVENOUS
  Administered 2019-07-23: 60 ug via INTRAVENOUS

## 2019-07-23 MED ORDER — SCOPOLAMINE 1 MG/3DAYS TD PT72
MEDICATED_PATCH | TRANSDERMAL | Status: AC
  Filled 2019-07-23: qty 1

## 2019-07-23 MED ORDER — FENTANYL CITRATE (PF) 100 MCG/2ML IJ SOLN
50.0000 ug | INTRAMUSCULAR | Status: DC | PRN
  Administered 2019-07-23 (×2): 100 ug via INTRAVENOUS
  Filled 2019-07-23 (×2): qty 2

## 2019-07-23 MED ORDER — DIPHENHYDRAMINE HCL 25 MG PO CAPS
25.0000 mg | ORAL_CAPSULE | Freq: Four times a day (QID) | ORAL | Status: DC | PRN
  Administered 2019-07-24: 25 mg via ORAL
  Filled 2019-07-23: qty 1

## 2019-07-23 MED ORDER — DIBUCAINE (PERIANAL) 1 % EX OINT: 1.0000 "application " | TOPICAL_OINTMENT | CUTANEOUS | Status: DC | PRN

## 2019-07-23 MED ORDER — TERBUTALINE SULFATE 1 MG/ML IJ SOLN
0.2500 mg | Freq: Once | INTRAMUSCULAR | Status: AC | PRN
  Administered 2019-07-23: 0.25 mg via SUBCUTANEOUS
  Filled 2019-07-23: qty 1

## 2019-07-23 MED ORDER — KETOROLAC TROMETHAMINE 30 MG/ML IJ SOLN
30.0000 mg | Freq: Once | INTRAMUSCULAR | Status: AC | PRN
  Administered 2019-07-23: 30 mg via INTRAVENOUS

## 2019-07-23 MED ORDER — LACTATED RINGERS IV SOLN
INTRAVENOUS | Status: DC | PRN
Start: 2019-07-23 — End: 2019-07-23

## 2019-07-23 MED ORDER — OXYCODONE HCL 5 MG PO TABS: 5.0000 mg | ORAL_TABLET | Freq: Once | ORAL | Status: DC | PRN

## 2019-07-23 MED ORDER — SIMETHICONE 80 MG PO CHEW: 80.0000 mg | CHEWABLE_TABLET | ORAL | Status: DC | PRN

## 2019-07-23 MED ORDER — SODIUM CHLORIDE 0.9 % IR SOLN
Status: DC | PRN
  Administered 2019-07-23: 1000 mL

## 2019-07-23 MED ORDER — OXYTOCIN 40 UNITS IN NORMAL SALINE INFUSION - SIMPLE MED: 2.5000 [IU]/h | INTRAVENOUS | Status: AC

## 2019-07-23 MED ORDER — OXYTOCIN 40 UNITS IN NORMAL SALINE INFUSION - SIMPLE MED
INTRAVENOUS | Status: DC | PRN
Start: 2019-07-23 — End: 2019-07-23
  Administered 2019-07-23: 40 [IU] via INTRAVENOUS

## 2019-07-23 MED ORDER — SODIUM CHLORIDE 0.9 % IV SOLN
INTRAVENOUS | Status: DC | PRN
Start: 2019-07-23 — End: 2019-07-23

## 2019-07-23 MED ORDER — NALBUPHINE HCL 10 MG/ML IJ SOLN: 5.0000 mg | INTRAMUSCULAR | Status: DC | PRN

## 2019-07-23 MED ORDER — OXYTOCIN 40 UNITS IN NORMAL SALINE INFUSION - SIMPLE MED
INTRAVENOUS | Status: AC
  Filled 2019-07-23: qty 1000

## 2019-07-23 MED ORDER — EPHEDRINE 5 MG/ML INJ: 10.0000 mg | INTRAVENOUS | Status: DC | PRN

## 2019-07-23 MED ORDER — NALOXONE HCL 0.4 MG/ML IJ SOLN: 0.4000 mg | INTRAMUSCULAR | Status: DC | PRN

## 2019-07-23 MED ORDER — PHENYLEPHRINE 40 MCG/ML (10ML) SYRINGE FOR IV PUSH (FOR BLOOD PRESSURE SUPPORT)
80.0000 ug | PREFILLED_SYRINGE | INTRAVENOUS | Status: DC | PRN
  Administered 2019-07-23: 80 ug via INTRAVENOUS
  Filled 2019-07-23: qty 10

## 2019-07-23 MED ORDER — ACETAMINOPHEN 500 MG PO TABS
1000.0000 mg | ORAL_TABLET | Freq: Four times a day (QID) | ORAL | Status: DC
  Administered 2019-07-23 – 2019-07-26 (×10): 1000 mg via ORAL
  Filled 2019-07-23 (×12): qty 2

## 2019-07-23 MED ORDER — DIPHENHYDRAMINE HCL 25 MG PO CAPS
25.0000 mg | ORAL_CAPSULE | ORAL | Status: DC | PRN
  Administered 2019-07-23: 25 mg via ORAL
  Filled 2019-07-23: qty 1

## 2019-07-23 MED ORDER — SODIUM CHLORIDE (PF) 0.9 % IJ SOLN
INTRAMUSCULAR | Status: DC | PRN
  Administered 2019-07-23: 12 mL/h via EPIDURAL

## 2019-07-23 MED ORDER — LACTATED RINGERS IV SOLN: 500.0000 mL | INTRAVENOUS | Status: DC | PRN

## 2019-07-23 MED ORDER — OXYTOCIN BOLUS FROM INFUSION: 500.0000 mL | Freq: Once | INTRAVENOUS | Status: DC

## 2019-07-23 MED ORDER — NALBUPHINE HCL 10 MG/ML IJ SOLN: 5.0000 mg | Freq: Once | INTRAMUSCULAR | Status: DC | PRN

## 2019-07-23 MED ORDER — SIMETHICONE 80 MG PO CHEW
80.0000 mg | CHEWABLE_TABLET | Freq: Three times a day (TID) | ORAL | Status: DC
  Administered 2019-07-23 – 2019-07-26 (×7): 80 mg via ORAL
  Filled 2019-07-23 (×8): qty 1

## 2019-07-23 MED ORDER — SODIUM CHLORIDE 0.9% FLUSH: 3.0000 mL | INTRAVENOUS | Status: DC | PRN

## 2019-07-23 MED ORDER — PROMETHAZINE HCL 25 MG/ML IJ SOLN: 6.2500 mg | INTRAMUSCULAR | Status: DC | PRN

## 2019-07-23 MED ORDER — SIMETHICONE 80 MG PO CHEW
80.0000 mg | CHEWABLE_TABLET | ORAL | Status: DC
  Administered 2019-07-23 – 2019-07-25 (×4): 80 mg via ORAL
  Filled 2019-07-23 (×3): qty 1

## 2019-07-23 MED ORDER — OXYTOCIN 40 UNITS IN NORMAL SALINE INFUSION - SIMPLE MED: 2.5000 [IU]/h | INTRAVENOUS | Status: DC

## 2019-07-23 MED ORDER — MISOPROSTOL 25 MCG QUARTER TABLET
25.0000 ug | ORAL_TABLET | ORAL | Status: DC | PRN
  Administered 2019-07-23: 25 ug via VAGINAL
  Filled 2019-07-23: qty 1

## 2019-07-23 MED ORDER — SCOPOLAMINE 1 MG/3DAYS TD PT72
1.0000 | MEDICATED_PATCH | Freq: Once | TRANSDERMAL | Status: AC
  Administered 2019-07-23: 1.5 mg via TRANSDERMAL

## 2019-07-23 MED ORDER — MEPERIDINE HCL 25 MG/ML IJ SOLN: 6.2500 mg | INTRAMUSCULAR | Status: DC | PRN

## 2019-07-23 MED ORDER — MENTHOL 3 MG MT LOZG: 1.0000 | LOZENGE | OROMUCOSAL | Status: DC | PRN

## 2019-07-23 MED ORDER — KETOROLAC TROMETHAMINE 30 MG/ML IJ SOLN
30.0000 mg | Freq: Four times a day (QID) | INTRAMUSCULAR | Status: AC
  Administered 2019-07-23 – 2019-07-24 (×3): 30 mg via INTRAVENOUS
  Filled 2019-07-23 (×3): qty 1

## 2019-07-23 MED ORDER — PRENATAL MULTIVITAMIN CH
1.0000 | ORAL_TABLET | Freq: Every day | ORAL | Status: DC
  Administered 2019-07-25 – 2019-07-26 (×2): 1 via ORAL
  Filled 2019-07-23 (×3): qty 1

## 2019-07-23 MED ORDER — SOD CITRATE-CITRIC ACID 500-334 MG/5ML PO SOLN
30.0000 mL | ORAL | Status: DC | PRN
  Administered 2019-07-23: 30 mL via ORAL
  Filled 2019-07-23: qty 30

## 2019-07-23 MED ORDER — TERBUTALINE SULFATE 1 MG/ML IJ SOLN: 0.2500 mg | Freq: Once | INTRAMUSCULAR | Status: DC | PRN

## 2019-07-23 MED ORDER — ONDANSETRON HCL 4 MG/2ML IJ SOLN: 4.0000 mg | Freq: Four times a day (QID) | INTRAMUSCULAR | Status: DC | PRN

## 2019-07-23 MED ORDER — LIDOCAINE HCL (PF) 1 % IJ SOLN
INTRAMUSCULAR | Status: DC | PRN
  Administered 2019-07-23: 11 mL via EPIDURAL

## 2019-07-23 MED ORDER — SENNOSIDES-DOCUSATE SODIUM 8.6-50 MG PO TABS
2.0000 | ORAL_TABLET | ORAL | Status: DC
  Administered 2019-07-23 – 2019-07-25 (×3): 2 via ORAL
  Filled 2019-07-23 (×3): qty 2

## 2019-07-23 MED ORDER — DIPHENHYDRAMINE HCL 50 MG/ML IJ SOLN: 12.5000 mg | INTRAMUSCULAR | Status: DC | PRN

## 2019-07-23 MED ORDER — OXYCODONE-ACETAMINOPHEN 5-325 MG PO TABS: 2.0000 | ORAL_TABLET | ORAL | Status: DC | PRN

## 2019-07-23 MED ORDER — ONDANSETRON HCL 4 MG/2ML IJ SOLN
4.0000 mg | Freq: Three times a day (TID) | INTRAMUSCULAR | Status: DC | PRN
  Administered 2019-07-23: 4 mg via INTRAVENOUS
  Filled 2019-07-23: qty 2

## 2019-07-23 MED ORDER — MORPHINE SULFATE (PF) 0.5 MG/ML IJ SOLN
INTRAMUSCULAR | Status: DC | PRN
  Administered 2019-07-23: 3 mg via EPIDURAL

## 2019-07-23 MED ORDER — LACTATED RINGERS IV SOLN
500.0000 mL | Freq: Once | INTRAVENOUS | Status: AC
  Administered 2019-07-23: 500 mL via INTRAVENOUS

## 2019-07-23 MED ORDER — WITCH HAZEL-GLYCERIN EX PADS: 1.0000 "application " | MEDICATED_PAD | CUTANEOUS | Status: DC | PRN

## 2019-07-23 MED ORDER — KETOROLAC TROMETHAMINE 30 MG/ML IJ SOLN
INTRAMUSCULAR | Status: AC
  Filled 2019-07-23: qty 1

## 2019-07-23 MED ORDER — MORPHINE SULFATE (PF) 0.5 MG/ML IJ SOLN
INTRAMUSCULAR | Status: AC
  Filled 2019-07-23: qty 10

## 2019-07-23 MED ORDER — OXYCODONE-ACETAMINOPHEN 5-325 MG PO TABS: 1.0000 | ORAL_TABLET | ORAL | Status: DC | PRN

## 2019-07-23 MED ORDER — COCONUT OIL OIL: 1.0000 "application " | TOPICAL_OIL | Status: DC | PRN

## 2019-07-23 MED ORDER — ACETAMINOPHEN 325 MG PO TABS: 650.0000 mg | ORAL_TABLET | ORAL | Status: DC | PRN

## 2019-07-23 MED ORDER — OXYTOCIN 40 UNITS IN NORMAL SALINE INFUSION - SIMPLE MED
1.0000 m[IU]/min | INTRAVENOUS | Status: DC
  Filled 2019-07-23: qty 1000

## 2019-07-23 MED ORDER — SODIUM BICARBONATE 8.4 % IV SOLN: INTRAVENOUS | Status: DC | PRN

## 2019-07-23 MED ORDER — LIDOCAINE-EPINEPHRINE (PF) 2 %-1:200000 IJ SOLN
INTRAMUSCULAR | Status: AC
  Filled 2019-07-23: qty 10

## 2019-07-23 MED ORDER — NALOXONE HCL 4 MG/10ML IJ SOLN
1.0000 ug/kg/h | INTRAVENOUS | Status: DC | PRN
  Filled 2019-07-23: qty 5

## 2019-07-23 MED ORDER — CEFAZOLIN SODIUM-DEXTROSE 2-3 GM-%(50ML) IV SOLR
INTRAVENOUS | Status: DC | PRN
Start: 2019-07-23 — End: 2019-07-23
  Administered 2019-07-23: 2 g via INTRAVENOUS

## 2019-07-23 MED ORDER — LIDOCAINE HCL (PF) 1 % IJ SOLN: 30.0000 mL | INTRAMUSCULAR | Status: DC | PRN

## 2019-07-23 MED ORDER — PHENYLEPHRINE 40 MCG/ML (10ML) SYRINGE FOR IV PUSH (FOR BLOOD PRESSURE SUPPORT)
80.0000 ug | PREFILLED_SYRINGE | INTRAVENOUS | Status: AC | PRN
  Administered 2019-07-23 (×3): 80 ug via INTRAVENOUS

## 2019-07-23 SURGICAL SUPPLY — 38 items
BENZOIN TINCTURE PRP APPL 2/3 (GAUZE/BANDAGES/DRESSINGS) ×2 IMPLANT
CHLORAPREP W/TINT 26ML (MISCELLANEOUS) ×2 IMPLANT
CLAMP CORD UMBIL (MISCELLANEOUS) IMPLANT
CLOTH BEACON ORANGE TIMEOUT ST (SAFETY) ×2 IMPLANT
DRSG OPSITE POSTOP 4X10 (GAUZE/BANDAGES/DRESSINGS) ×2 IMPLANT
ELECT REM PT RETURN 9FT ADLT (ELECTROSURGICAL) ×4
ELECTRODE REM PT RTRN 9FT ADLT (ELECTROSURGICAL) ×2 IMPLANT
EXTRACTOR VACUUM KIWI (MISCELLANEOUS) IMPLANT
GLOVE BIOGEL PI IND STRL 6.5 (GLOVE) ×1 IMPLANT
GLOVE BIOGEL PI IND STRL 7.0 (GLOVE) ×1 IMPLANT
GLOVE BIOGEL PI INDICATOR 6.5 (GLOVE) ×1
GLOVE BIOGEL PI INDICATOR 7.0 (GLOVE) ×1
GLOVE ECLIPSE 6.0 STRL STRAW (GLOVE) ×2 IMPLANT
GOWN STRL REUS W/TWL LRG LVL3 (GOWN DISPOSABLE) ×4 IMPLANT
HEMOSTAT ARISTA ABSORB 3G PWDR (HEMOSTASIS) ×2 IMPLANT
KIT ABG SYR 3ML LUER SLIP (SYRINGE) ×2 IMPLANT
NEEDLE HYPO 25X5/8 SAFETYGLIDE (NEEDLE) IMPLANT
NS IRRIG 1000ML POUR BTL (IV SOLUTION) ×2 IMPLANT
PACK C SECTION WH (CUSTOM PROCEDURE TRAY) ×2 IMPLANT
PAD OB MATERNITY 4.3X12.25 (PERSONAL CARE ITEMS) ×2 IMPLANT
PENCIL SMOKE EVAC W/HOLSTER (ELECTROSURGICAL) ×2 IMPLANT
RTRCTR C-SECT PINK 25CM LRG (MISCELLANEOUS) ×2 IMPLANT
SPONGE LAP 18X18 RF (DISPOSABLE) ×2 IMPLANT
STRIP CLOSURE SKIN 1/2X4 (GAUZE/BANDAGES/DRESSINGS) ×2 IMPLANT
SUT MNCRL 0 VIOLET CTX 36 (SUTURE) ×2 IMPLANT
SUT MONOCRYL 0 CTX 36 (SUTURE) ×2
SUT PLAIN 0 NONE (SUTURE) IMPLANT
SUT PLAIN 2 0 (SUTURE) ×1
SUT PLAIN ABS 2-0 CT1 27XMFL (SUTURE) ×1 IMPLANT
SUT VIC AB 0 CTX 36 (SUTURE) ×2
SUT VIC AB 0 CTX36XBRD ANBCTRL (SUTURE) ×2 IMPLANT
SUT VIC AB 2-0 CT1 27 (SUTURE) ×1
SUT VIC AB 2-0 CT1 TAPERPNT 27 (SUTURE) ×1 IMPLANT
SUT VICRYL 4-0 PS2 18IN ABS (SUTURE) ×2 IMPLANT
SYR 3ML 25GX5/8 SAFETY (SYRINGE) ×2 IMPLANT
TOWEL OR 17X24 6PK STRL BLUE (TOWEL DISPOSABLE) ×2 IMPLANT
TRAY FOLEY W/BAG SLVR 14FR LF (SET/KITS/TRAYS/PACK) ×2 IMPLANT
WATER STERILE IRR 1000ML POUR (IV SOLUTION) ×2 IMPLANT

## 2019-07-23 NOTE — Progress Notes (Signed)
Asked by RN, after discussing with Dr. Harrington Challenger, to assess fetal position as unable to feel presenting part.  BSUS: VERTEX  Emily Ellison, MD OB Family Medicine Fellow, [facility] for [name], [place]

## 2019-07-23 NOTE — H&P (Signed)
39 y.o. [id number] @ [redacted]w[redacted]d presents for IOL for CHTN at term.  Otherwise has good fetal movement and no bleeding.  Pregnancy c/b: 1. CHTN--on norvasc 5mg  daily.  BPs have been well controlled without necessitating dose adjustment.  History of preeclampsia with last pregnancy requiring readmission x 2.  Has been on low dose aspirin this pregnancy.   2. Mild polyhydramnios: Most recent AFI 24.8 3. Genital herpes--on suppressive valtrex--denies prodromal symptoms or lesions 4.  AMA--Declined genetic screening 5.  H/o GDM with last pregnancy--passed 1 hour gtt at 28 week   Past Medical History:  Diagnosis Date  . Allergy   . Beta thalassemia trait   . Beta thalassemia trait   . Genital herpes   . Gestational diabetes   . HPV (human papilloma virus) anogenital infection   . Hx of chlamydia infection   . Hx of pre-eclampsia in prior pregnancy, currently pregnant   . Hypertension   . Iron deficiency anemia   . Menometrorrhagia     Past Surgical History:  Procedure Laterality Date  . COLPOSCOPY    . DILATION AND EVACUATION  2009  . DILATION AND EVACUATION N/A 11/08/2015   Procedure: DILATATION AND EVACUATION WITH CHROMASOME STUDIES;  Surgeon: Servando Salina, MD;  Location: [facility];  Service: Gynecology;  Laterality: N/A;    OB History  Gravida Para Term Preterm AB Living  4 2 2   1 2   SAB TAB Ectopic Multiple Live Births  1       2    # Outcome Date GA Lbr Len/2nd Weight Sex Delivery Anes PTL Lv  4 Current           3 Term 2019     Vag-Spont   LIV  2 Term 2009 [redacted]w[redacted]d  2750 g F Vag-Spont   LIV  1 SAB             Social History   Socioeconomic History  . Marital status: Married    Spouse name: Mandalynn Singh  . Number of children: 1  . Years of education: MBA  . Highest education level: Not on file  Occupational History  . Occupation: Probation officer  . Occupation: residence Charity fundraiser    Comment: [employer] (04/23/2016)  Social Needs  . Financial resource strain: Not on file   . Food insecurity:    Worry: Not on file    Inability: Not on file  . Transportation needs:    Medical: Not on file    Non-medical: Not on file  Tobacco Use  . Smoking status: Never Smoker  . Smokeless tobacco: Never Used  Substance and Sexual Activity  . Alcohol use: No    Frequency: Never  . Drug use: No  . Sexual activity: Yes    Birth control/protection: None  Other Topics Concern  . Not on file  Social History Narrative   Marital status: married x 7 years      Children: 75 yo daughter.      Lives: with husband, daughter.      Employment:  Theme park manager, Residence Charity fundraiser      Tobacco; none      Alcohol: rare to none in 2018      Drugs:  None      Exercise: none in 2018      Seatbelt: 100%   Patient has no known allergies.    Prenatal Transfer Tool  Maternal Diabetes: Yes:  Diabetes Type:  Diet controlled Genetic Screening: Declined  NIPS with no  result--saw GC--declined further screening or amnio Maternal Ultrasounds/Referrals: Normal Fetal Ultrasounds or other Referrals:  None Maternal Substance Abuse:  No Significant Maternal Medications:  Meds include: Other:  norvasc, valtrex Significant Maternal Lab Results: Lab values include: Group B Strep negative  ABO, Rh: --/--/B POS, B POS Performed at [facility], [place] [address], [place], [place] [address]  [phone]/29 0040) Antibody: NEG (04/29 0040) Rubella: Immune (09/30 0000) RPR: Nonreactive (09/30 0000)  HBsAg: Negative (09/30 0000)  HIV: Non-reactive (09/30 0000)  GBS:   Negative    Vitals:   07/23/19 0516 07/23/19 0709  BP: (!) 147/84 (!) 149/82  Pulse: 80 73  Resp: 18 18     General:  NAD Abdomen:  soft, gravid  Ex:  no edema SVE:  Vulva / perineum / vagina / cervix without lesions. 3/60/-3, AROM copious clear fluid FHTs:  130s, mod var, +accels, cat 1 Toco:  q2-4 minutes   Growth: 4/26: 8lb 2oz (80%) 4/8: 5lb 14oz (60%)  A/P   39 y.o. G3P1011 [redacted]w[redacted]d presents with IOL for CHTN at  term IOL--s/p AROM, contracting well.  Will start pitocin as needed CHTN--continue norvasc.  H/o preeclampsia requiring readmission following delivery last pregnancy.  Will monitor closely and adjust meds as needed  HSV--no evidence of active lesions  FSR/ vtx/ GBS neg  [place]

## 2019-07-23 NOTE — Op Note (Signed)
Cesarean Section Procedure Note  Pre-operative Diagnosis: 1. Intrauterine pregnancy at [redacted]w[redacted]d  2. Chronic hypertension  3.  Advanced maternal age  39.  Fetal intolerance of labor  Post-operative Diagnosis: same as above  Surgeon: Jerelyn Charles, MD  Procedure: Primary low transverse cesarean section   Anesthesia: Spinal anesthesia  Estimated Blood Loss: 566 mL         Drains: Foley catheter         Specimens: placenta to pathology              Complications:  None; patient tolerated the procedure well.         Disposition: PACU - hemodynamically stable.  Findings:  Normal uterus, tubes and ovaries bilaterally.  Viable female infant, 3190g (7lb 0.5oz) Apgars 9, 9.    Procedure Details   After epidural anesthesia was found to adequate, the patient was placed in the dorsal supine position with a leftward tilt, prepped and draped in the usual sterile manner. A Pfannenstiel incision was made and carried down through the subcutaneous tissue to the fascia.  The fascia was incised in the midline and the fascial incision was extended laterally with Mayo scissors. The superior aspect of the fascial incision was grasped with two Kocher clamps, tented up and the rectus muscles dissected off sharply. The rectus was then dissected off with blunt dissection and Mayo scissors inferiorly. The rectus muscles were separated in the midline. The abdominal peritoneum was identified, tented up, entered bluntly, and the incision was extended superiorly and inferiorly with good visualization of the bladder. The Alexis retractor was deployed. The vesicouterine peritoneum was identified, tented up, entered sharply, and the bladder flap was created digitally. A scalpel was then used to make a low transverse incision on the uterus which was extended in the cephalad-caudad direction with blunt dissection. The fluid was clear. The fetal vertex was identified, elevated out of the pelvis and brought to the hysterotomy.  The head  was delivered easily followed by the shoulders and body.  After a 60 second delay per protocol, the cord was clamped and cut and the infant was passed to the waiting neonatologist.  The placenta was then delivered spontaneously, intact and appear normal, the uterus was cleared of all clot and debris.  The placenta was sent to pathology.  The hysterotomy was repaired with #0 Monocryl in running locked fashion.  A second imbricating layer of #0 Monocryl was placed.  Addition figure of eight and running stitches were placed at the midportion of the hysterotomy for serosal bleeding unresponsive to cautery, and excellent hemostasis was noted. There was some continued oozing at the edge of the bladder flap that was cauterized.  Due to continued slow oozing of the serosal edges, Arista was placed over the bladder flap and uterine serosa with excellent hemostasis.  The Alexis retractor was removed from the abdomen. The peritoneum was examined and all vessels noted to be hemostatic. The abdominal cavity was cleared of all clot and debris.  The peritoneum was closed with 2-0 vicryl in a running fashion.  The rectus muscles were then closed with 2-0 Vicryl. The fascia and rectus muscles were inspected and were hemostatic. The fascia was closed with 0 Vicryl in a running fashion. The subcutaneous layer was irrigated and all bleeders cauterized. There were again multiple small perforators, so additional Arista was placed in the subcutaneous layer. The subcutaneous layer was closed with interrupted plain gut. The skin was closed with 3-0 monocryl in a subcuticular fashion. The  incision was dressed with benzoine, steri strips and honeycomb dressing. All sponge lap and needle counts were correct x3. Patient tolerated the procedure well and recovered in stable condition following the procedure.

## 2019-07-23 NOTE — Progress Notes (Signed)
Spoke with Dr. Carlis Abbott regarding pt surgical dressing. Aware pt honeycomb dressing is about 75% saturated and loosened at the bottom. Given orders to apply a new honeycomb and if needed due to later saturation to apply an extra layer or two of pressure (dressing).

## 2019-07-23 NOTE — Progress Notes (Addendum)
Patient had epidural placed at approximately 0940.  Following epidural placement, she had 2 prolonged decelerations.   Prior to epidural placement, BP running 120s-140s/80-90s.  She has been relatively hypotensive since that time at 90-100s/60s.  She has received multiple doses of phenylephrine.  Variability has remained moderate, but she continues to have recurrent late decelerations that have not resolved with IVF bolus, position changes, or pheylephrine.  At this time, I recommend proceeding to the OR for fetal intolerance of labor.   We discussed the risks to cesarean section to include infection, bleeding, damage to surrounding structures (including but not limited to bowel, bladder, tubes, ovaries, nerves, vessels, baby), need for blood transfusion, venous thromboembolism, need for additional procedures.   Ancef 2 gm on call to OR  Addendum:  Pt was 4 cm and remote from delivery.  Partner with recent vasectomy for contraception

## 2019-07-23 NOTE — Lactation Note (Signed)
This note was copied from a baby's chart. Lactation Consultation Note  Patient Name: Emily Downs [id number] Date: 07/23/2019 Reason for consult: Initial assessment;Early term 37-38.6wks P3, 8 hour ETI infant. Mom's hx: GDM-diet controlled, HTN, C/S delivery , HSV on Valtrex-L2 safe. Infant had one stool and 2 void diapers. Per mom, infant latched in L&D for 30 minutes, 2nd time-10 minutes and afterwards to attempts infant stopped latching at breast. Mom is experienced in breastfeeding, she breastfed 1st child for 5 weeks and 2nd child who is 2 years for 15 months. Mom is active on the [facility] program in [facility] and has brought her [place] to the hospital from home. Mom has carpal tunnel syndrome in both hands, earlier she had on her braces but felt they made latching worst felt more numbness in her hands. LC  suggested using the football hold position due to having carpal tunnel in hands and C/S. Infant was cuing to breastfeed, mom latched infant on her left breast using the football hold position, mom brought infant chin first, with nose and chin touching, top lip flanged out. LC observed swallows with latch and mom did hand compressions and breast stimulation, infant was still breastfeeding when [place] left the room after 12 minutes. Mom was given DEBP to pump every 3 hours for 15 minutes to help establish milk supply- with hand expression colostrum is not currently present in breast. Mom will breastfeed infant according to cues, on demand, not exceed 3 hours without feeding infant, 8 -12 + times within 24 hours. Mom will do STS as much as possible. LC discussed with mom, reassess infant in morning regarding colostrum, if infant is supplement until mom supply is establish she will offer formula after latching infant at breast. LC discussed breastfeeding resources after discharge: [place] hotline, [place] out patient clinic and [place] online breastfeeding support group. Mom's plan: 1. Breastfeed  according cues. 2. Pump every 3 hours for 15 minutes on initial setting. 3. If she supplements after breastfeeding her choice will be formula, LC gave supplemental guideline sheet based on infant's age/ hours of life.    Maternal Data Formula Feeding for Exclusion: Yes Reason for exclusion: Mother's choice to formula and breast feed on admission Does the patient have breastfeeding experience prior to this delivery?: Yes  Feeding Feeding Type: Breast Fed  LATCH Score Latch: Grasps breast easily, tongue down, lips flanged, rhythmical sucking.  Audible Swallowing: Spontaneous and intermittent  Type of Nipple: Everted at rest and after stimulation  Comfort (Breast/Nipple): Soft / non-tender  Hold (Positioning): Assistance needed to correctly position infant at breast and maintain latch.  LATCH Score: 9  Interventions Interventions: Breast feeding basics reviewed;Breast compression;Adjust position;Assisted with latch;Skin to skin;Support pillows;Position options;Breast massage;Hand express;DEBP  Lactation Tools Discussed/Used WIC Program: Yes(Per mom, she brought her [place] DEBP with her from home.) Pump Review: Setup, frequency, and cleaning;Milk Storage Initiated by:: RN Date initiated:: 07/23/19   Consult Status Consult Status: Follow-up Date: 07/24/19 Follow-up type: In-patient    Vicente Serene 07/23/2019, 8:35 PM

## 2019-07-23 NOTE — Anesthesia Postprocedure Evaluation (Signed)
Anesthesia Post Note  Patient: DARNAE BERNAT  Procedure(s) Performed: CESAREAN SECTION (N/A )     Patient location during evaluation: PACU Anesthesia Type: Epidural Level of consciousness: awake and alert Pain management: pain level controlled Vital Signs Assessment: post-procedure vital signs reviewed and stable Respiratory status: spontaneous breathing, nonlabored ventilation and respiratory function stable Cardiovascular status: blood pressure returned to baseline and stable Postop Assessment: no apparent nausea or vomiting Anesthetic complications: no    Last Vitals:  Vitals:   07/23/19 1330 07/23/19 1345  BP: 125/76 96/75  Pulse: 79 83  Resp: 17 15  Temp:  36.4 C  SpO2: 99% 98%    Last Pain:  Vitals:   07/23/19 1345  TempSrc: Oral  PainSc: 1    Pain Goal:    LLE Motor Response: Purposeful movement (07/23/19 1345)   RLE Motor Response: Purposeful movement (07/23/19 1345)       Epidural/Spinal Function Cutaneous sensation: Able to Discern Pressure (07/23/19 1345), Patient able to flex knees: No (07/23/19 1345), Patient able to lift hips off bed: No (07/23/19 1345), Back pain beyond tenderness at insertion site: No (07/23/19 1345), Progressively worsening motor and/or sensory loss: No (07/23/19 1345), Bowel and/or bladder incontinence post epidural: No (07/23/19 1345)  Lynda Rainwater

## 2019-07-23 NOTE — Progress Notes (Signed)
Terbutaline per Dr. Carlis Abbott

## 2019-07-23 NOTE — Anesthesia Preprocedure Evaluation (Signed)
Anesthesia Evaluation  Patient identified by MRN, date of birth, ID band Patient awake    Reviewed: Allergy & Precautions, H&P , NPO status , Patient's Chart, lab work & pertinent test results  Airway Mallampati: II  TM Distance: >3 FB Neck ROM: full    Dental no notable dental hx.    Pulmonary neg pulmonary ROS,    Pulmonary exam normal breath sounds clear to auscultation       Cardiovascular hypertension, Normal cardiovascular exam Rhythm:regular Rate:Normal     Neuro/Psych    GI/Hepatic   Endo/Other  diabetes  Renal/GU      Musculoskeletal   Abdominal (+) + obese,   Peds  Hematology  (+) anemia ,   Anesthesia Other Findings   Reproductive/Obstetrics (+) Pregnancy                             Anesthesia Physical  Anesthesia Plan  ASA: II  Anesthesia Plan: Epidural   Post-op Pain Management:    Induction: Intravenous  PONV Risk Score and Plan: 2 and Treatment may vary due to age or medical condition  Airway Management Planned: Natural Airway  Additional Equipment:   Intra-op Plan:   Post-operative Plan:   Informed Consent: I have reviewed the patients History and Physical, chart, labs and discussed the procedure including the risks, benefits and alternatives for the proposed anesthesia with the patient or authorized representative who has indicated his/her understanding and acceptance.       Plan Discussed with: Anesthesiologist  Anesthesia Plan Comments:         Anesthesia Quick Evaluation

## 2019-07-23 NOTE — Anesthesia Procedure Notes (Signed)
Epidural Patient location during procedure: OB Start time: 07/23/2019 9:17 AM End time: 07/23/2019 9:31 AM  Staffing Anesthesiologist: Lynda Rainwater, MD Performed: anesthesiologist   Preanesthetic Checklist Completed: patient identified, IV checked, site marked, risks and benefits discussed, surgical consent, monitors and equipment checked, pre-op evaluation and timeout performed  Epidural Patient position: sitting Prep: ChloraPrep Patient monitoring: heart rate, cardiac monitor, continuous pulse ox and blood pressure Approach: midline Location: L2-L3 Injection technique: LOR saline  Needle:  Needle type: Tuohy  Needle gauge: 17 G Needle length: 9 cm Needle insertion depth: 9 cm Catheter type: closed end flexible Catheter size: 20 Guage Catheter at skin depth: 13 cm Test dose: negative  Assessment Events: blood not aspirated, injection not painful, no injection resistance, no paresthesia and negative IV test  Additional Notes Reason for block:procedure for pain

## 2019-07-23 NOTE — Transfer of Care (Signed)
Immediate Anesthesia Transfer of Care Note  Patient: Emily Downs  Procedure(s) Performed: CESAREAN SECTION (N/A )  Patient Location: PACU  Anesthesia Type:Epidural  Level of Consciousness: awake, alert , oriented and patient cooperative  Airway & Oxygen Therapy: Patient Spontanous Breathing  Post-op Assessment: Report given to RN and Post -op Vital signs reviewed and stable  Post vital signs: Reviewed and stable  Last Vitals:  Vitals Value Taken Time  BP 113/75 07/23/19 1246  Temp    Pulse 87 07/23/19 1251  Resp 19 07/23/19 1251  SpO2 100 % 07/23/19 1251  Vitals shown include unvalidated device data.  Last Pain:  Vitals:   07/23/19 0959  PainSc: Asleep         Complications: No apparent anesthesia complications

## 2019-07-24 LAB — CBC
HCT: 30.7 % — ABNORMAL LOW (ref 36.0–46.0)
Hemoglobin: 9.8 g/dL — ABNORMAL LOW (ref 12.0–15.0)
MCH: 24 pg — ABNORMAL LOW (ref 26.0–34.0)
MCHC: 31.9 g/dL (ref 30.0–36.0)
MCV: 75.2 fL — ABNORMAL LOW (ref 80.0–100.0)
Platelets: 193 10*3/uL (ref 150–400)
RBC: 4.08 MIL/uL (ref 3.87–5.11)
RDW: 17.3 % — ABNORMAL HIGH (ref 11.5–15.5)
WBC: 11.3 10*3/uL — ABNORMAL HIGH (ref 4.0–10.5)
nRBC: 0 % (ref 0.0–0.2)

## 2019-07-24 NOTE — Progress Notes (Signed)
Subjective: Postpartum Day 1: Cesarean Delivery Patient reports tolerating PO and no problems voiding.    Objective: Vital signs in last 24 hours: Vitals:   07/23/19 2124 07/24/19 0128 07/24/19 0534 07/24/19 0940  BP:  (!) 142/76 (!) 144/74 127/78  Pulse:  64 63 62  Resp: 18 18 18    Temp: 98.4 F (36.9 C) 98.6 F (37 C) 98.5 F (36.9 C)   TempSrc: Oral Oral Axillary   SpO2: 99% 99% 100%   Weight:      Height:        Physical Exam:  General: alert and no distress Lochia: appropriate Uterine Fundus: firm Incision: dressing in place, partially saturated otherwise dry and clean DVT Evaluation: No evidence of DVT seen on physical exam.  Recent Labs    07/23/19 0816 07/24/19 0534  HGB 13.1 9.8*  HCT 41.3 30.7*    Assessment/Plan: Status post Cesarean section. Doing well postoperatively.  Continue current care. Emily Downs Emily Downs PPD#1 sp CS at [redacted]w[redacted]d 2/2 fetal intolerance of labor 1. PPC: EBL 566, Hgb 13.1>9.8. Doing well postop 2. CHTN: On norvasc 5mg  QD, history of readmission x2 with last pregnancy.  -Continue monitoring BP, increase meds prn 3. Obesity BMI 37 Rubella immune, blood type B+, breastfeeding, baby boy - planning circ with Peds  Emily Downs 07/24/2019, 10:38 AM

## 2019-07-25 NOTE — Lactation Note (Signed)
This note was copied from a baby's chart. Lactation Consultation Note  Patient Name: Emily Downs [id number] Date: 07/25/2019 Reason for consult: Follow-up assessment  LC student did a follow up visit with the dyad.  MOB noted that baby completed a bottle of her pumped milk.    [name] is now 71hrs old. Mom is a G4P3 parent and has breastfeeding experience.  She nursed her last baby for 14 months, and stopped because of decrease in milk.  Mom has expressed that baby only has had 3 voids and no poops. Before [facility] student walked in, baby took 7-46mL of expressed breast milk by bottle.     Mom noted that her breast do hurt when he nurses.  [facility] student requested that at the next feeding to call lactation so that we can observe a feeding.      The plan for mom is to continue feeding baby 8-12x w/in a 24 hour period.  Call lactation for next feeding so that we can observe a nursing session.  Continue to do lots of STS.   Consult Status Consult Status: Follow-up Date: 07/26/19 Follow-up type: In-patient    Emily Downs. Emily Downs 07/25/2019, 5:48 PM

## 2019-07-25 NOTE — Lactation Note (Signed)
This note was copied from a baby's chart. Lactation Consultation Note  Patient Name: Emily Downs [id number] Date: 07/25/2019 Reason for consult: Follow-up assessment  LC student was called to room by nurse because patient was nursing baby.  [facility] student entered room to baby nursing.  MOB expressed that the latch was hurting.  [facility] student took baby off breast and attempted to re-latch.  The first attempt baby wouldn't wake, MOB and [place] student decided to remove clothes from baby and re-latch again.  This time the [facility] student latched baby with the teacup hold and MOB expressed that the pain was less at the breast then went away.  [name] nursed for about 5 minutes with a 1:1 ratio of audible swallows.  Baby stopped nursing and had a big soft, black poop.    [facility] student taught the sister of the mother how to do teacup hold to assist mom during next feeding session.  The plan is for mom to continue to nurse baby 8-12x w/in a 24hr period.  Put baby to breast first, getting assistance with the teacup hold if possible.  Mom can pump after the nursing session if breast still feel full and supplement baby with expressed breast milk.           Consult Status Consult Status: Follow-up Date: 07/26/19 Follow-up type: In-patient    Emily Downs 07/25/2019, 6:39 PM

## 2019-07-25 NOTE — Progress Notes (Signed)
Subjective: Postpartum Day 2: Cesarean Delivery Patient reports moderate pain, tolerating PO, voiding and passing flatus. Denies HA/VC/RUQ pain  Objective: Vital signs in last 24 hours: Vitals:   07/24/19 0940 07/24/19 1318 07/24/19 2057 07/25/19 0350  BP: 127/78 125/82 134/77 (!) 145/88  Pulse: 62 77 76 61  Resp:  18 17 18   Temp:  98.3 F (36.8 C) 99.1 F (37.3 C) 98 F (36.7 C)  TempSrc:  Axillary Oral Oral  SpO2:  100% 100%   Weight:      Height:        Physical Exam:  General: alert and no distress Lochia: appropriate Uterine Fundus: firm Incision: dressing in place, removed, incision clean and dry DVT Evaluation: No evidence of DVT seen on physical exam.  Assessment/Plan: Status post Cesarean section. Doing well postoperatively.  Continue current care. Emily Downs [id number] PPD#2 sp CS at [redacted]w[redacted]d 2/2 fetal intolerance of labor 1. PPC: EBL 566, Hgb 13.1>9.8. Doing well postop 2. CHTN: On norvasc 5mg  QD, history of readmission x2 with last pregnancy.  -Continue monitoring BP, increase meds prn 3. Obesity BMI 37 Rubella immune, blood type B+, breastfeeding, baby boy - planning circ with Peds  Emily Downs 07/25/2019, 7:44 AM

## 2019-07-26 DIAGNOSIS — Z98891 History of uterine scar from previous surgery: Secondary | ICD-10-CM

## 2019-07-26 LAB — COMPREHENSIVE METABOLIC PANEL
ALT: 38 U/L (ref 0–44)
AST: 46 U/L — ABNORMAL HIGH (ref 15–41)
Albumin: 2.2 g/dL — ABNORMAL LOW (ref 3.5–5.0)
Alkaline Phosphatase: 87 U/L (ref 38–126)
Anion gap: 6 (ref 5–15)
BUN: 14 mg/dL (ref 6–20)
CO2: 25 mmol/L (ref 22–32)
Calcium: 8.1 mg/dL — ABNORMAL LOW (ref 8.9–10.3)
Chloride: 107 mmol/L (ref 98–111)
Creatinine, Ser: 0.81 mg/dL (ref 0.44–1.00)
GFR calc Af Amer: >60 mL/min (ref >60)
GFR calc non Af Amer: >60 mL/min (ref >60)
Glucose, Bld: 93 mg/dL (ref 70–99)
Potassium: 4 mmol/L (ref 3.5–5.1)
Sodium: 138 mmol/L (ref 135–145)
Total Bilirubin: 0.4 mg/dL (ref 0.3–1.2)
Total Protein: 5.2 g/dL — ABNORMAL LOW (ref 6.5–8.1)

## 2019-07-26 LAB — CBC
HCT: 31.3 % — ABNORMAL LOW (ref 36.0–46.0)
Hemoglobin: 10 g/dL — ABNORMAL LOW (ref 12.0–15.0)
MCH: 24.3 pg — ABNORMAL LOW (ref 26.0–34.0)
MCHC: 31.9 g/dL (ref 30.0–36.0)
MCV: 76 fL — ABNORMAL LOW (ref 80.0–100.0)
Platelets: 264 10*3/uL (ref 150–400)
RBC: 4.12 MIL/uL (ref 3.87–5.11)
RDW: 17.2 % — ABNORMAL HIGH (ref 11.5–15.5)
WBC: 8.3 10*3/uL (ref 4.0–10.5)
nRBC: 0 % (ref 0.0–0.2)

## 2019-07-26 MED ORDER — OXYCODONE-ACETAMINOPHEN 5-325 MG PO TABS: 1.0000 | ORAL_TABLET | Freq: Four times a day (QID) | ORAL | 0 refills | Status: DC | PRN

## 2019-07-26 MED ORDER — AMLODIPINE BESYLATE 5 MG PO TABS
10.0000 mg | ORAL_TABLET | Freq: Every day | ORAL | 3 refills | Status: DC
Start: 2019-07-26 — End: 2019-12-21

## 2019-07-26 MED ORDER — AMLODIPINE BESYLATE 5 MG PO TABS
5.0000 mg | ORAL_TABLET | Freq: Once | ORAL | Status: AC
  Administered 2019-07-26: 5 mg via ORAL
  Filled 2019-07-26: qty 1

## 2019-07-26 MED ORDER — DOCUSATE SODIUM 50 MG PO CAPS
50.0000 mg | ORAL_CAPSULE | Freq: Two times a day (BID) | ORAL | 0 refills | Status: DC
Start: 2019-07-26 — End: 2019-12-21

## 2019-07-26 MED ORDER — IBUPROFEN 800 MG PO TABS
800.0000 mg | ORAL_TABLET | Freq: Three times a day (TID) | ORAL | 0 refills | Status: DC | PRN
Start: 2019-07-26 — End: 2019-12-21

## 2019-07-26 NOTE — Lactation Note (Signed)
This note was copied from a baby's chart. Lactation Consultation Note:  Mother is a P3, infant is 22 hours old. Mother standing up in  room, holding infant when [facility] arrived in the room. She reports that she is getting ready to breastfeeding infant. Mother denies having any nipple pain. She reports that infant is feeding well.  Discussed treatment and prevention of engorgement.  Mother advised to continue cue base feed infant.  Discussed importance of resting and allowing family to assist her with other small children. Informed mother of S/S of Mastitis.   Mother to continue to cue base feed infant and feed at least 8-12 times or more in 24 hours and advised to allow for cluster feeding infant as needed.   Mother to continue to due STS. Mother is aware of available LC services at [facility], BFSG'S, OP Dept, and phone # for questions or concerns about breastfeeding.  Mother receptive to all teaching and plan of care.        Patient Name: Emily Downs [id number] Date: 07/26/2019 Reason for consult: Follow-up assessment   Maternal Data    Feeding Feeding Type: Breast Fed  LATCH Score                   Interventions    Lactation Tools Discussed/Used     Consult Status Consult Status: Complete    Darla Lesches 07/26/2019, 11:10 AM

## 2019-07-26 NOTE — Discharge Instructions (Signed)
Prescriptions Motrin 800mg  every 8 hours for pain Acetaminophen 650mg  every 6 hours for moderate pain Percocet (Oxycodone 5mg -acetaminophen 325mg ) every 4 hours for severe pain.  Make sure to not exceed acetaminophen 3000mg  every day.  Postpartum Care After Cesarean Delivery This sheet gives you information about how to care for yourself from the time you deliver your baby to up to 6-12 weeks after delivery (postpartum period). Your health care provider may also give you more specific instructions. If you have problems or questions, contact your health care provider. Follow these instructions at home: Medicines  Take over-the-counter and prescription medicines only as told by your health care provider.  If you were prescribed an antibiotic medicine, take it as told by your health care provider. Do not stop taking the antibiotic even if you start to feel better.  Ask your health care provider if the medicine prescribed to you: ? Requires you to avoid driving or using heavy machinery. ? Can cause constipation. You may need to take actions to prevent or treat constipation, such as:  Drink enough fluid to keep your urine pale yellow.  Take over-the-counter or prescription medicines.  Eat foods that are high in fiber, such as beans, whole grains, and fresh fruits and vegetables.  Limit foods that are high in fat and processed sugars, such as fried or sweet foods. Activity  Gradually return to your normal activities as told by your health care provider.  Avoid activities that take a lot of effort and energy (are strenuous) until approved by your health care provider. Walking at a slow to moderate pace is usually safe. Ask your health care provider what activities are safe for you. ? Do not lift anything that is heavier than your baby or 10 lb (4.5 kg) as told by your health care provider. ? Do not vacuum, climb stairs, or drive a car for as long as told by your health care provider.  If  possible, have someone help you at home until you are able to do your usual activities yourself.  Rest as much as possible. Try to rest or take naps while your baby is sleeping. Vaginal bleeding  It is normal to have vaginal bleeding (lochia) after delivery. Wear a sanitary pad to absorb vaginal bleeding and discharge. ? During the first week after delivery, the amount and appearance of lochia is often similar to a menstrual period. ? Over the next few weeks, it will gradually decrease to a dry, yellow-brown discharge. ? For most women, lochia stops completely by 4-6 weeks after delivery. Vaginal bleeding can vary from woman to woman.  Change your sanitary pads frequently. Watch for any changes in your flow, such as: ? A sudden increase in volume. ? A change in color. ? Large blood clots.  If you pass a blood clot, save it and call your health care provider to discuss. Do not flush blood clots down the toilet before you get instructions from your health care provider.  Do not use tampons or douches until your health care provider says this is safe.  If you are not breastfeeding, your period should return 6-8 weeks after delivery. If you are breastfeeding, your period may return anytime between 8 weeks after delivery and the time that you stop breastfeeding. Perineal care   If your C-section (Cesarean section) was unplanned, and you were allowed to labor and push before delivery, you may have pain, swelling, and discomfort of the tissue between your vaginal opening and your anus (perineum). You  may also have an incision in the tissue (episiotomy) or the tissue may have torn during delivery. Follow these instructions as told by your health care provider: ? Keep your perineum clean and dry as told by your health care provider. Use medicated pads and pain-relieving sprays and creams as directed. ? If you have an episiotomy or vaginal tear, check the area every day for signs of infection. Check  for:  Redness, swelling, or pain.  Fluid or blood.  Warmth.  Pus or a bad smell. ? You may be given a squirt bottle to use instead of wiping to clean the perineum area after you go to the bathroom. As you start healing, you may use the squirt bottle before wiping yourself. Make sure to wipe gently. ? To relieve pain caused by an episiotomy, vaginal tear, or hemorrhoids, try taking a warm sitz bath 2-3 times a day. A sitz bath is a warm water bath that is taken while you are sitting down. The water should only come up to your hips and should cover your buttocks. Breast care  Within the first few days after delivery, your breasts may feel heavy, full, and uncomfortable (breast engorgement). You may also have milk leaking from your breasts. Your health care provider can suggest ways to help relieve breast discomfort. Breast engorgement should go away within a few days.  If you are breastfeeding: ? Wear a bra that supports your breasts and fits you well. ? Keep your nipples clean and dry. Apply creams and ointments as told by your health care provider. ? You may need to use breast pads to absorb milk leakage. ? You may have uterine contractions every time you breastfeed for several weeks after delivery. Uterine contractions help your uterus return to its normal size. ? If you have any problems with breastfeeding, work with your health care provider or a Science writer.  If you are not breastfeeding: ? Avoid touching your breasts as this can make your breasts produce more milk. ? Wear a well-fitting bra and use cold packs to help with swelling. ? Do not squeeze out (express) milk. This causes you to make more milk. Intimacy and sexuality  Ask your health care provider when you can engage in sexual activity. This may depend on your: ? Risk of infection. ? Healing rate. ? Comfort and desire to engage in sexual activity.  You are able to get pregnant after delivery, even if you have  not had your period. If desired, talk with your health care provider about methods of family planning or birth control (contraception). Lifestyle  Do not use any products that contain nicotine or tobacco, such as cigarettes, e-cigarettes, and chewing tobacco. If you need help quitting, ask your health care provider.  Do not drink alcohol, especially if you are breastfeeding. Eating and drinking   Drink enough fluid to keep your urine pale yellow.  Eat high-fiber foods every day. These may help prevent or relieve constipation. High-fiber foods include: ? Whole grain cereals and breads. ? Brown rice. ? Beans. ? Fresh fruits and vegetables.  Take your prenatal vitamins until your postpartum checkup or until your health care provider tells you it is okay to stop. General instructions  Keep all follow-up visits for you and your baby as told by your health care provider. Most women visit their health care provider for a postpartum checkup within the first 3-6 weeks after delivery. Contact a health care provider if you:  Feel unable to cope with the  changes that a new baby brings to your life, and these feelings do not go away.  Feel unusually sad or worried.  Have breasts that are painful, hard, or turn red.  Have a fever.  Have trouble holding urine or keeping urine from leaking.  Have little or no interest in activities you used to enjoy.  Have not breastfed at all and you have not had a menstrual period for 12 weeks after delivery.  Have stopped breastfeeding and you have not had a menstrual period for 12 weeks after you stopped breastfeeding.  Have questions about caring for yourself or your baby.  Pass a blood clot from your vagina. Get help right away if you:  Have chest pain.  Have difficulty breathing.  Have sudden, severe leg pain.  Have severe pain or cramping in your abdomen.  Bleed from your vagina so much that you fill more than one sanitary pad in one hour.  Bleeding should not be heavier than your heaviest period.  Develop a severe headache.  Faint.  Have blurred vision or spots in your vision.  Have a bad-smelling vaginal discharge.  Have thoughts about hurting yourself or your baby. If you ever feel like you may hurt yourself or others, or have thoughts about taking your own life, get help right away. You can go to your nearest emergency department or call:  Your local emergency services (911 in the [place]  A suicide crisis helpline, such as the [place] at 404-603-1100. This is open 24 hours a day. Summary  The period of time from when you deliver your baby to up to 6-12 weeks after delivery is called the postpartum period.  Gradually return to your normal activities as told by your health care provider.  Keep all follow-up visits for you and your baby as told by your health care provider. This information is not intended to replace advice given to you by your health care provider. Make sure you discuss any questions you have with your health care provider. Document Revised: 10/30/2017 Document Reviewed: 10/30/2017 Elsevier Patient Education  [place].

## 2019-07-26 NOTE — Progress Notes (Signed)
Subjective: Postpartum Day 3: Cesarean Delivery Patient reports moderate pain, improves with meds. Tolerating PO, voiding and passing flatus. Denies HA/VC/RUQ pain  Objective: Vital signs in last 24 hours: Vitals:   07/25/19 1408 07/25/19 2135 07/26/19 0539 07/26/19 0846  BP: 134/84 126/86 138/86 (!) 148/89  Pulse: 81 89 87 82  Resp: 16 16 18    Temp: 98.7 F (37.1 C) 98.4 F (36.9 C) 97.6 F (36.4 C)   TempSrc: Oral Oral Oral   SpO2: 99% 100%  100%  Weight:      Height:        Physical Exam:  General: alert and no distress Lochia: appropriate Uterine Fundus: firm Incision: clean and dry. Appropriately tender to touch DVT Evaluation: No evidence of DVT seen on physical exam.  Recent Labs    07/24/19 0534 07/26/19 0537  WBC 11.3* 8.3  HGB 9.8* 10.0*  HCT 30.7* 31.3*  PLT 193 264    Recent Labs    07/26/19 0537  NA 138  K 4.0  CL 107  BUN 14  CREATININE 0.81  GLUCOSE 93  BILITOT 0.4  ALT 38  AST 46*  ALKPHOS 87  PROT 5.2*  ALBUMIN 2.2*    Recent Labs    07/26/19 0537  CALCIUM 8.1*   Assessment/Plan: Discharge today Emily Downs [id number] PPD#3 sp CS at [redacted]w[redacted]d 2/2 fetal intolerance of labor 1. PPC: EBL 566, Hgb 13.1>9.8. Doing well postop 2. CHTN: On norvasc 5mg  QD, history of readmission x2 with last pregnancy. CBC/CMP wnl today. Asymptomatic.  -Postpartum BP normotensive to mild range, have not increased medications. Will obtain 2 more BP and then plan for discharge. Reviewed with patient checking BP at home, symptoms of preeclampsia. Plan for BP check this week.  3. Obesity BMI 37 Rubella immune, blood type B+, breastfeeding, baby boy - planning circ with Peds  Emily Downs 07/26/2019, 8:57 AM

## 2019-07-26 NOTE — Discharge Summary (Signed)
Postpartum Discharge Summary Patient Name: Emily Downs DOB: 02-20-1981 MRN: [id number]  Date of admission: 07/23/2019 Delivering Provider: Jerelyn Charles   Date of discharge: 07/26/2019  Admitting diagnosis: Benign essential hypertension antepartum in third trimester [O10.013] Intrauterine pregnancy: [redacted]w[redacted]d    Secondary diagnosis:  Active Problems:   Benign essential hypertension antepartum in third trimester   S/P cesarean section  Additional problems: obesity     Discharge diagnosis: Term Pregnancy Delivered and CHTN                                                                                                Post partum procedures:none  Augmentation: AROM and Pitocin  Complications: None  Hospital course:  Onset of Labor With Unplanned C/S  39y.o. yo [id number] admitted for IOL for [facility] 07/23/2019. Patient had a labor course significant for Membrane Rupture Time/Date: 8:15 AM ,07/23/2019  but then she had prolonged decelerations following epidural placement. The patient went for cesarean section due to Non-Reassuring FHR, and delivered a Viable infant,07/23/2019. Delivery time: 11:57 AM  Details of operation can be found in separate operative note. Patient had an uncomplicated postpartum course.  She is ambulating,tolerating a regular diet, passing flatus, and urinating well.  On POD#3 she had persistent BP 140s/90s, given this her home amlodipine was increased from 109m66mo 82m77md instructed to monitor BP at home. She was asymptomatic for preeclampsia and CBC and CMP wnl. Patient is discharged home in stable condition 07/26/19.  Magnesium Sulfate received: No BMZ received: No Rhophylac:No MMR:No Transfusion:No  Physical exam  Vitals:   07/26/19 0539 07/26/19 0846 07/26/19 1141 07/26/19 1403  BP: 138/86 (!) 148/89 (!) 148/93 (!) 141/90  Pulse: 87 82 (!) 104 88  Resp: 18     Temp: 97.6 F (36.4 C)     TempSrc: Oral     SpO2:  100%    Weight:       Height:      General: alert and no distress Lochia: appropriate Uterine Fundus: firm Incision: clean and dry. Appropriately tender to touch DVT Evaluation: No evidence of DVT seen on physical exam. Labs: Lab Results  Component Value Date   WBC 8.3 07/26/2019   HGB 10.0 (L) 07/26/2019   HCT 31.3 (L) 07/26/2019   MCV 76.0 (L) 07/26/2019   PLT 264 07/26/2019   CMP Latest Ref Rng & Units 07/26/2019  Glucose 70 - 99 mg/dL 93  BUN 6 - 20 mg/dL 14  Creatinine 0.44 - 1.00 mg/dL 0.81  Sodium 135 - 145 mmol/L 138  Potassium 3.5 - 5.1 mmol/L 4.0  Chloride 98 - 111 mmol/L 107  CO2 22 - 32 mmol/L 25  Calcium 8.9 - 10.3 mg/dL 8.1(L)  Total Protein 6.5 - 8.1 g/dL 5.2(L)  Total Bilirubin 0.3 - 1.2 mg/dL 0.4  Alkaline Phos 38 - 126 U/L 87  AST 15 - 41 U/L 46(H)  ALT 0 - 44 U/L 38   Edinburgh Score: Edinburgh Postnatal Depression Scale Screening Tool 07/26/2019  I have been able to laugh and see the funny side of  things. 0  I have looked forward with enjoyment to things. 0  I have blamed myself unnecessarily when things went wrong. 2  I have been anxious or worried for no good reason. 2  I have felt scared or panicky for no good reason. 0  Things have been getting on top of me. 2  I have been so unhappy that I have had difficulty sleeping. 0  I have felt sad or miserable. 1  I have been so unhappy that I have been crying. 0  The thought of harming myself has occurred to me. 0  Edinburgh Postnatal Depression Scale Total 7    Discharge instruction: per After Visit Summary and "Baby and Me Booklet".  After visit meds:  Allergies as of 07/26/2019   No Known Allergies     Medication List    STOP taking these medications   valACYclovir 500 MG tablet Commonly known as: VALTREX     TAKE these medications   amLODipine 5 MG tablet Commonly known as: NORVASC Take 2 tablets (10 mg total) by mouth daily. What changed: how much to take   cetirizine 10 MG tablet Commonly known as:  ZYRTEC Take 10 mg by mouth daily.   docusate sodium 50 MG capsule Commonly known as: COLACE Take 1 capsule (50 mg total) by mouth 2 (two) times daily.   FENUGREEK PO Take 1 capsule by mouth daily.   ibuprofen 800 MG tablet Commonly known as: ADVIL Take 1 tablet (800 mg total) by mouth every 8 (eight) hours as needed.   oxyCODONE-acetaminophen 5-325 MG tablet Commonly known as: Percocet Take 1 tablet by mouth every 6 (six) hours as needed for severe pain.   Prenatal Plus/Iron 27-1 MG Tabs Take 1 tablet by mouth daily.       Diet: routine diet  Activity: Advance as tolerated. Pelvic rest for 6 weeks.   Outpatient follow up:In 2-3 days for BP check Follow up Appt:No future appointments. Follow up Visit: Follow-up Information    Taam-Akelman, Lawrence Santiago, MD. Schedule an appointment as soon as possible for a visit in 2 day(s).   Specialty: Obstetrics and Gynecology Contact information: [address] [place] [address] [phone]           Newborn Data: Live born female  Birth Weight: 7 lb 0.5 oz (3190 g) APGAR: 32, 9  Newborn Delivery   Birth date/time: 07/23/2019 11:57:00 Delivery type: C-Section, Low Transverse C-section categorization: Primary      Baby Feeding: Breast Disposition:home with mother   07/26/2019 Jonelle Sidle, MD

## 2019-07-27 LAB — SURGICAL PATHOLOGY

## 2019-07-27 IMAGING — US US PELVIS COMPLETE
1 series · 15 of 25 positions shown · non-contrast
Comparison: None.

CLINICAL DATA: Postpartum vaginal bleeding

EXAM:
TRANSABDOMINAL ULTRASOUND OF PELVIS
TECHNIQUE: Transabdominal ultrasound examination of the pelvis was performed
including evaluation of the uterus, ovaries, adnexal regions, and
pelvic cul-de-sac.

[Series 1: us pelvis complete · 52 acquisitions, 15 frames shown]
[im 1/52]
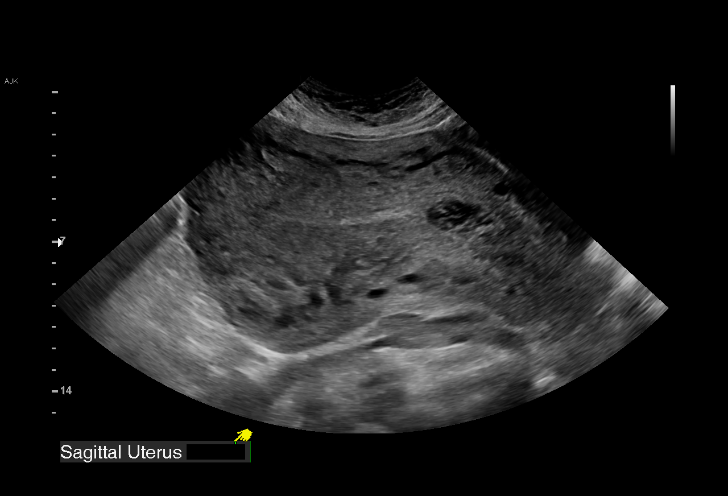
[im 5/52]
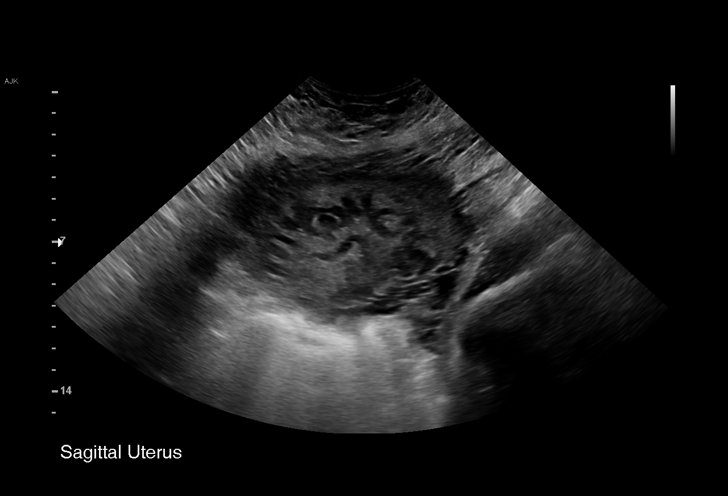
[im 9/52]
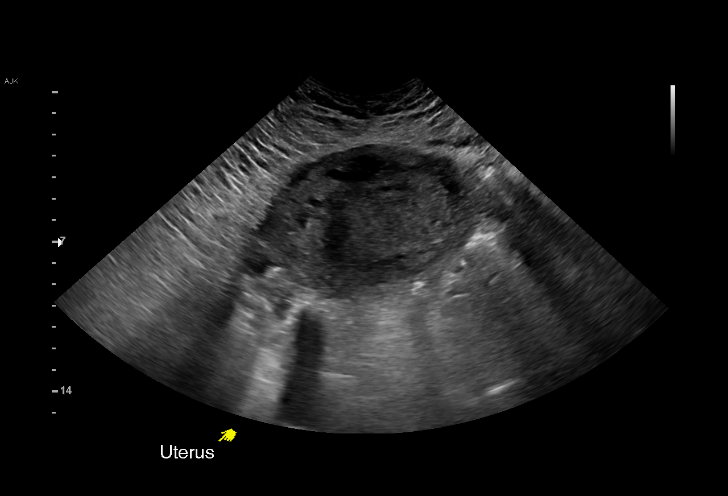
[im 11/52]
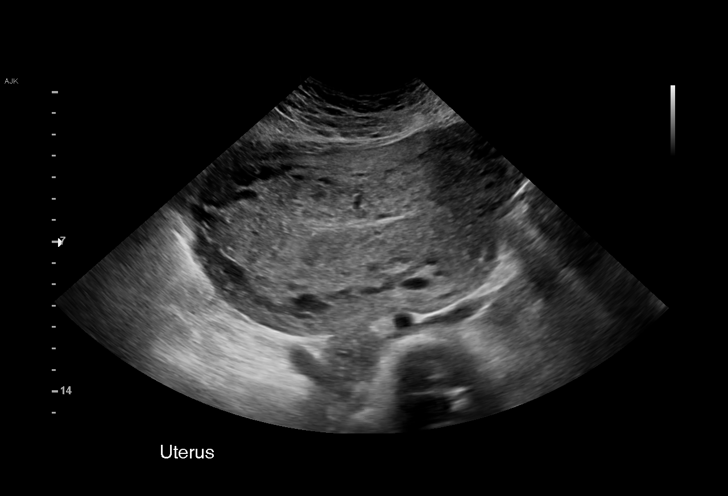
[im 15/52]
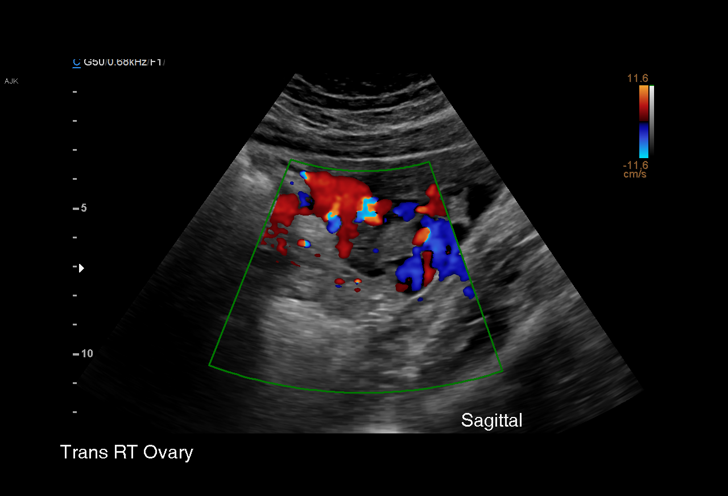
[im 20/52]
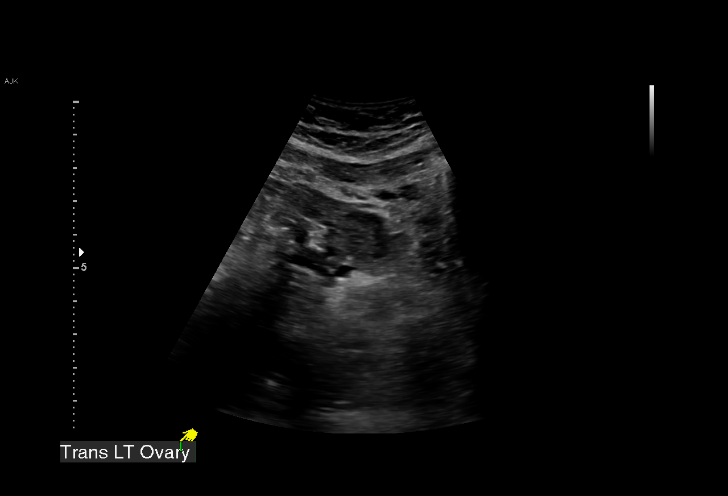
[im 22/52]
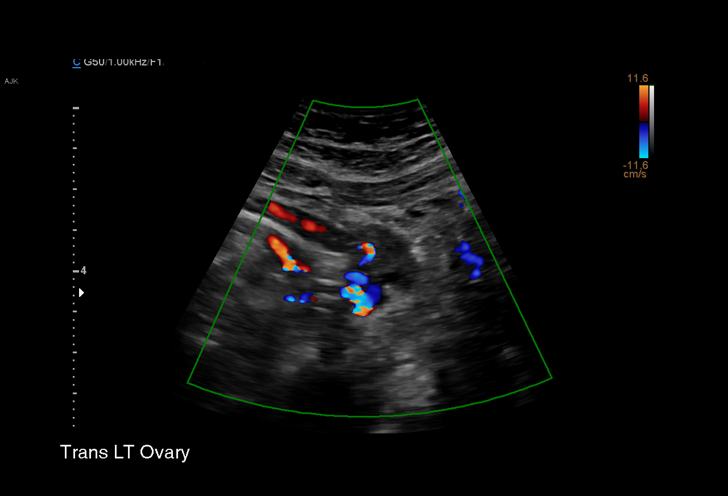
[im 26/52]
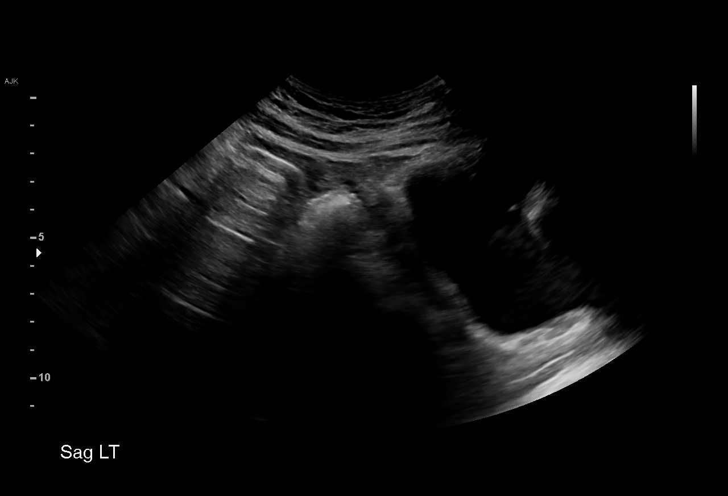
[im 30/52]
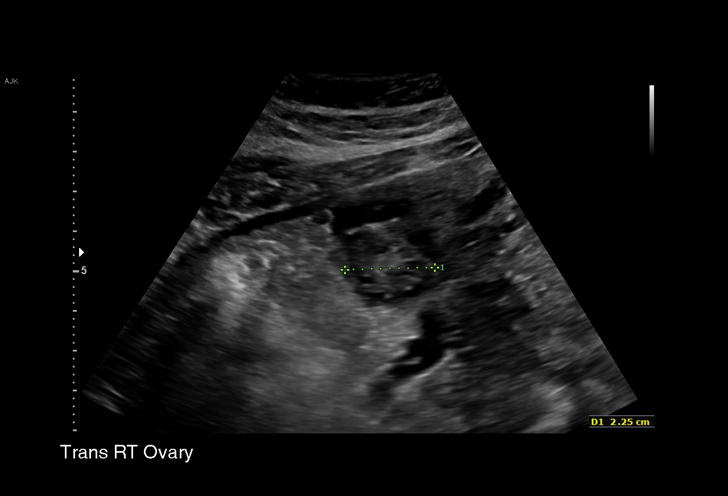
[im 32/52]
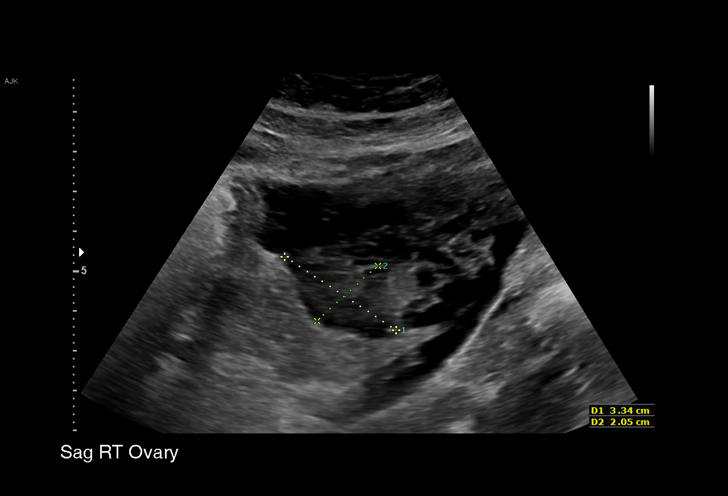
[im 37/52]
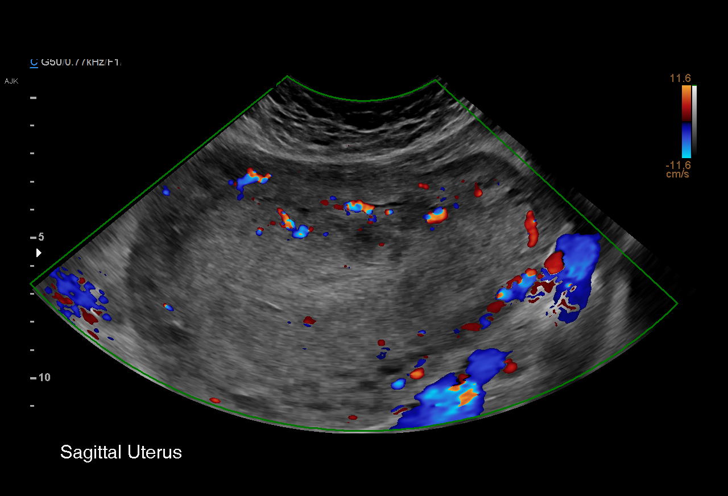
[im 41/52]
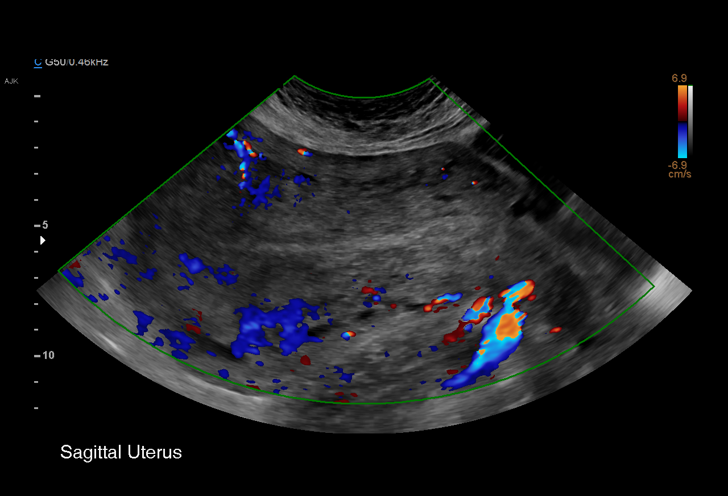
[im 43/52]
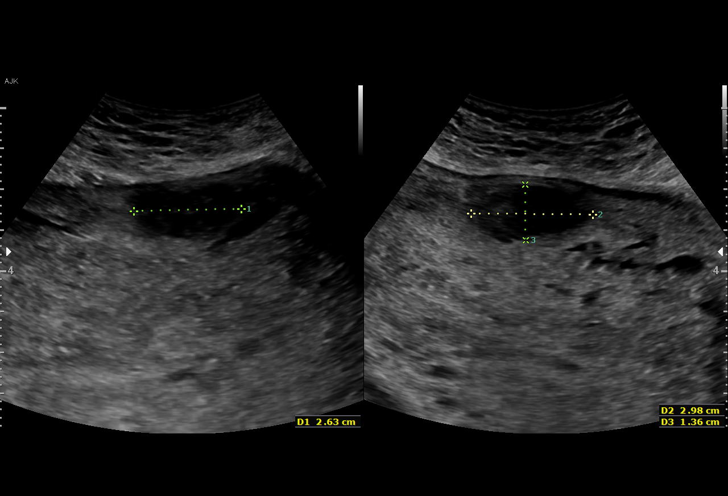
[im 47/52]
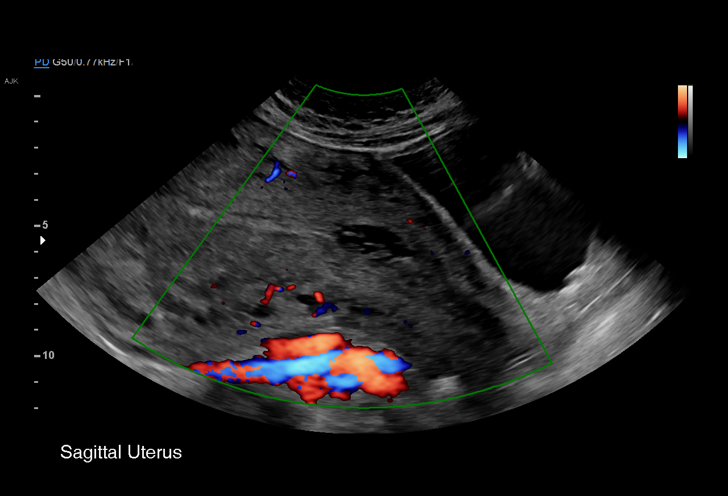
[im 52/52]
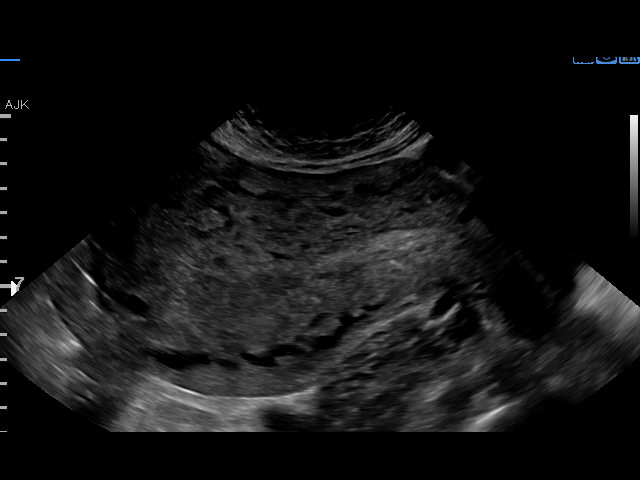

[15 of 25 positions shown; findings below may reference images not displayed]

FINDINGS: Uterus

Measurements: Patient is 3 days post vaginal delivery. Uterus
measures 20.5 x 9.9 x 14.7 cm. There is a focal leiomyoma anteriorly
measuring 3.0 x 1.4 x 2.6 cm. No other focal uterine masses evident.

Endometrium

Thickness: 4 mm.  No focal abnormality visualized.

Right ovary

Measurements: 3.3 x 2.1 x 2.3 cm. Normal appearance/no adnexal mass.

Left ovary

Measurements: 2.8 x 1.9 x 2.1 cm. Normal appearance/no adnexal mass.

Other findings:  No abnormal free fluid.
IMPRESSION: Uterus enlarged, consistent with recent postpartum state. Focal
leiomyoma anteriorly measuring 3.0 x 1.4 x 2.6 cm. No other focal
uterine mass evident. No endometrial thickening or inhomogeneity to
the endometrial echogenicity.

Ovaries appear normal bilaterally.  No free pelvic fluid evident.

## 2019-07-28 DIAGNOSIS — I10 Essential (primary) hypertension: Secondary | ICD-10-CM | POA: Diagnosis not present

## 2019-08-05 DIAGNOSIS — I10 Essential (primary) hypertension: Secondary | ICD-10-CM | POA: Diagnosis not present

## 2019-08-27 DIAGNOSIS — R87810 Cervical high risk human papillomavirus (HPV) DNA test positive: Secondary | ICD-10-CM | POA: Diagnosis not present

## 2019-08-27 DIAGNOSIS — Z124 Encounter for screening for malignant neoplasm of cervix: Secondary | ICD-10-CM | POA: Diagnosis not present

## 2019-09-21 ENCOUNTER — Encounter: Payer: Self-pay | Admitting: Emergency Medicine

## 2019-09-21 ENCOUNTER — Other Ambulatory Visit: Payer: Self-pay

## 2019-09-21 ENCOUNTER — Ambulatory Visit: Payer: BC Managed Care – PPO | Admitting: Emergency Medicine

## 2019-09-21 VITALS — BP 132/85 | HR 64 | Temp 97.6°F | Resp 16 | Ht 60.0 in | Wt 163.0 lb

## 2019-09-21 DIAGNOSIS — I10 Essential (primary) hypertension: Secondary | ICD-10-CM | POA: Diagnosis not present

## 2019-09-21 DIAGNOSIS — L989 Disorder of the skin and subcutaneous tissue, unspecified: Secondary | ICD-10-CM | POA: Diagnosis not present

## 2019-09-21 NOTE — Patient Instructions (Addendum)
   If you have lab work done today you will be contacted with your lab results within the next 2 weeks.  If you have not heard from us then please contact us. The fastest way to get your results is to register for My Chart.   IF you received an x-ray today, you will receive an invoice from [facility]. Please contact [facility] at [phone] with questions or concerns regarding your invoice.   IF you received labwork today, you will receive an invoice from [facility]. Please contact [facility] at [phone] with questions or concerns regarding your invoice.   Our billing staff will not be able to assist you with questions regarding bills from these companies.  You will be contacted with the lab results as soon as they are available. The fastest way to get your results is to activate your My Chart account. Instructions are located on the last page of this paperwork. If you have not heard from us regarding the results in 2 weeks, please contact this office.      Hypertension, Adult High blood pressure (hypertension) is when the force of blood pumping through the arteries is too strong. The arteries are the blood vessels that carry blood from the heart throughout the body. Hypertension forces the heart to work harder to pump blood and may cause arteries to become narrow or stiff. Untreated or uncontrolled hypertension can cause a heart attack, heart failure, a stroke, kidney disease, and other problems. A blood pressure reading consists of a higher number over a lower number. Ideally, your blood pressure should be below 120/80. The first ("top") number is called the systolic pressure. It is a measure of the pressure in your arteries as your heart beats. The second ("bottom") number is called the diastolic pressure. It is a measure of the pressure in your arteries as the heart relaxes. What are the causes? The exact cause of this condition is not known. There are some conditions  that result in or are related to high blood pressure. What increases the risk? Some risk factors for high blood pressure are under your control. The following factors may make you more likely to develop this condition:  Smoking.  Having type 2 diabetes mellitus, high cholesterol, or both.  Not getting enough exercise or physical activity.  Being overweight.  Having too much fat, sugar, calories, or salt (sodium) in your diet.  Drinking too much alcohol. Some risk factors for high blood pressure may be difficult or impossible to change. Some of these factors include:  Having chronic kidney disease.  Having a family history of high blood pressure.  Age. Risk increases with age.  Race. You may be at higher risk if you are African American.  Gender. Men are at higher risk than women before age 45. After age 65, women are at higher risk than men.  Having obstructive sleep apnea.  Stress. What are the signs or symptoms? High blood pressure may not cause symptoms. Very high blood pressure (hypertensive crisis) may cause:  Headache.  Anxiety.  Shortness of breath.  Nosebleed.  Nausea and vomiting.  Vision changes.  Severe chest pain.  Seizures. How is this diagnosed? This condition is diagnosed by measuring your blood pressure while you are seated, with your arm resting on a flat surface, your legs uncrossed, and your feet flat on the floor. The cuff of the blood pressure monitor will be placed directly against the skin of your upper arm at the level of your   heart. It should be measured at least twice using the same arm. Certain conditions can cause a difference in blood pressure between your right and left arms. Certain factors can cause blood pressure readings to be lower or higher than normal for a short period of time:  When your blood pressure is higher when you are in a health care provider's office than when you are at home, this is called white coat hypertension.  Most people with this condition do not need medicines.  When your blood pressure is higher at home than when you are in a health care provider's office, this is called masked hypertension. Most people with this condition may need medicines to control blood pressure. If you have a high blood pressure reading during one visit or you have normal blood pressure with other risk factors, you may be asked to:  Return on a different day to have your blood pressure checked again.  Monitor your blood pressure at home for 1 week or longer. If you are diagnosed with hypertension, you may have other blood or imaging tests to help your health care provider understand your overall risk for other conditions. How is this treated? This condition is treated by making healthy lifestyle changes, such as eating healthy foods, exercising more, and reducing your alcohol intake. Your health care provider may prescribe medicine if lifestyle changes are not enough to get your blood pressure under control, and if:  Your systolic blood pressure is above 130.  Your diastolic blood pressure is above 80. Your personal target blood pressure may vary depending on your medical conditions, your age, and other factors. Follow these instructions at home: Eating and drinking   Eat a diet that is high in fiber and potassium, and low in sodium, added sugar, and fat. An example eating plan is called the DASH (Dietary Approaches to Stop Hypertension) diet. To eat this way: ? Eat plenty of fresh fruits and vegetables. Try to fill one half of your plate at each meal with fruits and vegetables. ? Eat whole grains, such as whole-wheat pasta, brown rice, or whole-grain bread. Fill about one fourth of your plate with whole grains. ? Eat or drink low-fat dairy products, such as skim milk or low-fat yogurt. ? Avoid fatty cuts of meat, processed or cured meats, and poultry with skin. Fill about one fourth of your plate with lean proteins, such  as fish, chicken without skin, beans, eggs, or tofu. ? Avoid pre-made and processed foods. These tend to be higher in sodium, added sugar, and fat.  Reduce your daily sodium intake. Most people with hypertension should eat less than 1,500 mg of sodium a day.  Do not drink alcohol if: ? Your health care provider tells you not to drink. ? You are pregnant, may be pregnant, or are planning to become pregnant.  If you drink alcohol: ? Limit how much you use to:  0-1 drink a day for women.  0-2 drinks a day for men. ? Be aware of how much alcohol is in your drink. In the [place] one drink equals one 12 oz bottle of beer (355 mL), one 5 oz glass of wine (148 mL), or one 1 oz glass of hard liquor (44 mL). Lifestyle   Work with your health care provider to maintain a healthy body weight or to lose weight. Ask what an ideal weight is for you.  Get at least 30 minutes of exercise most days of the week. Activities may include walking, swimming,   or biking.  Include exercise to strengthen your muscles (resistance exercise), such as Pilates or lifting weights, as part of your weekly exercise routine. Try to do these types of exercises for 30 minutes at least 3 days a week.  Do not use any products that contain nicotine or tobacco, such as cigarettes, e-cigarettes, and chewing tobacco. If you need help quitting, ask your health care provider.  Monitor your blood pressure at home as told by your health care provider.  Keep all follow-up visits as told by your health care provider. This is important. Medicines  Take over-the-counter and prescription medicines only as told by your health care provider. Follow directions carefully. Blood pressure medicines must be taken as prescribed.  Do not skip doses of blood pressure medicine. Doing this puts you at risk for problems and can make the medicine less effective.  Ask your health care provider about side effects or reactions to medicines that you  should watch for. Contact a health care provider if you:  Think you are having a reaction to a medicine you are taking.  Have headaches that keep coming back (recurring).  Feel dizzy.  Have swelling in your ankles.  Have trouble with your vision. Get help right away if you:  Develop a severe headache or confusion.  Have unusual weakness or numbness.  Feel faint.  Have severe pain in your chest or abdomen.  Vomit repeatedly.  Have trouble breathing. Summary  Hypertension is when the force of blood pumping through your arteries is too strong. If this condition is not controlled, it may put you at risk for serious complications.  Your personal target blood pressure may vary depending on your medical conditions, your age, and other factors. For most people, a normal blood pressure is less than 120/80.  Hypertension is treated with lifestyle changes, medicines, or a combination of both. Lifestyle changes include losing weight, eating a healthy, low-sodium diet, exercising more, and limiting alcohol. This information is not intended to replace advice given to you by your health care provider. Make sure you discuss any questions you have with your health care provider. Document Revised: 11/20/2017 Document Reviewed: 11/20/2017 [employer] Patient Education  2020 [employer]  

## 2019-09-21 NOTE — Progress Notes (Signed)
Emily Downs 39 y.o.   Chief Complaint  Patient presents with  . Leg Pain    x 3 weeks  per patient spot on the LEFT calf with itching and soreness    HISTORY OF PRESENT ILLNESS: This is a 39 y.o. female complaining of spot on her left leg with some itching and soreness for the past 3 weeks. Also has a history of hypertension on amlodipine 10 mg daily and metoprolol 100 mg daily. BP Readings from Last 3 Encounters:  09/21/19 132/85  07/26/19 (!) 141/90  06/27/19 (!) 147/88  No other complaints or medical concerns today.   HPI   Prior to Admission medications   Medication Sig Start Date End Date Taking? Authorizing Provider  amLODipine (NORVASC) 5 MG tablet Take 2 tablets (10 mg total) by mouth daily. 07/26/19   Taam-Akelman, Lawrence Santiago, MD  cetirizine (ZYRTEC) 10 MG tablet Take 10 mg by mouth daily.    [provider]  docusate sodium (COLACE) 50 MG capsule Take 1 capsule (50 mg total) by mouth 2 (two) times daily. 07/26/19   Taam-Akelman, Lawrence Santiago, MD  FENUGREEK PO Take 1 capsule by mouth daily.     [provider]  ibuprofen (ADVIL) 800 MG tablet Take 1 tablet (800 mg total) by mouth every 8 (eight) hours as needed. 07/26/19   Taam-Akelman, Lawrence Santiago, MD  oxyCODONE-acetaminophen (PERCOCET) 5-325 MG tablet Take 1 tablet by mouth every 6 (six) hours as needed for severe pain. 07/26/19   Taam-Akelman, Lawrence Santiago, MD  Prenatal Vit-Fe Fumarate-FA (PRENATAL PLUS/IRON) 27-1 MG TABS Take 1 tablet by mouth daily.     [provider]    No Known Allergies  Patient Active Problem List   Diagnosis Date Noted  . S/P cesarean section 07/26/2019  . Benign essential hypertension antepartum in third trimester 07/23/2019  . Near syncope 06/27/2019  . Dehydration during pregnancy 06/27/2019  . Essential hypertension, benign 12/11/2013  . Menometrorrhagia   . Iron deficiency anemia     Past Medical History:  Diagnosis Date  . Allergy   . Beta thalassemia trait    . Beta thalassemia trait   . Genital herpes   . Gestational diabetes   . HPV (human papilloma virus) anogenital infection   . Hx of chlamydia infection   . Hx of pre-eclampsia in prior pregnancy, currently pregnant   . Hypertension   . Iron deficiency anemia   . Menometrorrhagia     Past Surgical History:  Procedure Laterality Date  . CESAREAN SECTION N/A 07/23/2019   Procedure: CESAREAN SECTION;  Surgeon: Jerelyn Charles, MD;  Location: [facility];  Service: Obstetrics;  Laterality: N/A;  . COLPOSCOPY    . DILATION AND EVACUATION  2009  . DILATION AND EVACUATION N/A 11/08/2015   Procedure: DILATATION AND EVACUATION WITH CHROMASOME STUDIES;  Surgeon: Servando Salina, MD;  Location: [facility];  Service: Gynecology;  Laterality: N/A;    Social History   Socioeconomic History  . Marital status: Married    Spouse name: Chiyoko Torrico  . Number of children: 1  . Years of education: MBA  . Highest education level: Not on file  Occupational History  . Occupation: Probation officer  . Occupation: residence Charity fundraiser    Comment: [employer] (04/23/2016)  Tobacco Use  . Smoking status: Never Smoker  . Smokeless tobacco: Never Used  Vaping Use  . Vaping Use: Never used  Substance and Sexual Activity  . Alcohol use: No  . Drug use: No  .  Sexual activity: Not Currently    Birth control/protection: None  Other Topics Concern  . Not on file  Social History Narrative   Marital status: married x 7 years      Children: 27 yo daughter.      Lives: with husband, daughter.      Employment:  Theme park manager, Residence Charity fundraiser      Tobacco; none      Alcohol: rare to none in 2018      Drugs:  None      Exercise: none in 2018      Seatbelt: 100%   Social Determinants of Radio broadcast assistant Strain:   . Difficulty of Paying Living Expenses:   Food Insecurity:   . Worried About Charity fundraiser in the Last Year:   . Arboriculturist in the Last Year:   Transportation Needs:   .  Film/video editor (Medical):   Marland Kitchen Lack of Transportation (Non-Medical):   Physical Activity:   . Days of Exercise per Week:   . Minutes of Exercise per Session:   Stress:   . Feeling of Stress :   Social Connections:   . Frequency of Communication with Friends and Family:   . Frequency of Social Gatherings with Friends and Family:   . Attends Religious Services:   . Active Member of Clubs or Organizations:   . Attends Archivist Meetings:   Marland Kitchen Marital Status:   Intimate Partner Violence:   . Fear of Current or Ex-Partner:   . Emotionally Abused:   Marland Kitchen Physically Abused:   . Sexually Abused:     Family History  Problem Relation Age of Onset  . Hypertension Mother   . Diabetes Mother   . Aneurysm Mother 1       brain aneurysm  . Sarcoidosis Mother   . Hyperlipidemia Father   . Diabetes Maternal Grandmother   . Stroke Maternal Grandmother   . Cancer Paternal Grandmother   . Crohn's disease Sister   . Hypertension Sister      Review of Systems  Constitutional: Negative.  Negative for chills and fever.  HENT: Negative.  Negative for congestion and sore throat.   Respiratory: Negative.  Negative for cough and shortness of breath.   Cardiovascular: Negative for chest pain and palpitations.  Gastrointestinal: Negative for abdominal pain, nausea and vomiting.  Genitourinary: Negative for dysuria and hematuria.  Neurological: Negative.  Negative for dizziness and headaches.  All other systems reviewed and are negative.  Today's Vitals   09/21/19 1501  BP: 132/85  Pulse: 64  Resp: 16  Temp: 97.6 F (36.4 C)  TempSrc: Temporal  SpO2: 99%  Weight: 163 lb (73.9 kg)  Height: 5' (1.524 m)   Body mass index is 31.83 kg/m.   Physical Exam Vitals reviewed.  Constitutional:      Appearance: Normal appearance.  HENT:     Head: Normocephalic.  Eyes:     Extraocular Movements: Extraocular movements intact.     Pupils: Pupils are equal, round, and reactive  to light.  Cardiovascular:     Rate and Rhythm: Normal rate and regular rhythm.     Pulses: Normal pulses.     Heart sounds: Normal heart sounds.  Pulmonary:     Effort: Pulmonary effort is normal.     Breath sounds: Normal breath sounds.  Musculoskeletal:     Cervical back: Normal range of motion and neck supple.  Skin:    Comments:  Circular nontender lesion to left lower leg.  Nonraised hyperpigmented borders.  No typical ringworm.  Neurological:     General: No focal deficit present.     Mental Status: She is alert and oriented to person, place, and time.  Psychiatric:        Mood and Affect: Mood normal.        Behavior: Behavior normal.       ASSESSMENT & PLAN: Patriciaann was seen today for leg pain.  Diagnoses and all orders for this visit:  Skin lesion  Essential hypertension, benign  Most likely traumatic bruise.  Advised to contact the office if worse or changes during the next several weeks.   Patient Instructions       If you have lab work done today you will be contacted with your lab results within the next 2 weeks.  If you have not heard from [place] then please contact us. The fastest way to get your results is to register for My Chart.   IF you received an x-ray today, you will receive an invoice from [facility]. Please contact [facility] at [phone] with questions or concerns regarding your invoice.   IF you received labwork today, you will receive an invoice from [facility]. Please contact [facility] at [phone] with questions or concerns regarding your invoice.   Our billing staff will not be able to assist you with questions regarding bills from these companies.  You will be contacted with the lab results as soon as they are available. The fastest way to get your results is to activate your My Chart account. Instructions are located on the last page of this paperwork. If you have not heard from [place] regarding the results in 2 weeks,  please contact this office.     Hypertension, Adult High blood pressure (hypertension) is when the force of blood pumping through the arteries is too strong. The arteries are the blood vessels that carry blood from the heart throughout the body. Hypertension forces the heart to work harder to pump blood and may cause arteries to become narrow or stiff. Untreated or uncontrolled hypertension can cause a heart attack, heart failure, a stroke, kidney disease, and other problems. A blood pressure reading consists of a higher number over a lower number. Ideally, your blood pressure should be below 120/80. The first ("top") number is called the systolic pressure. It is a measure of the pressure in your arteries as your heart beats. The second ("bottom") number is called the diastolic pressure. It is a measure of the pressure in your arteries as the heart relaxes. What are the causes? The exact cause of this condition is not known. There are some conditions that result in or are related to high blood pressure. What increases the risk? Some risk factors for high blood pressure are under your control. The following factors may make you more likely to develop this condition:  Smoking.  Having type 2 diabetes mellitus, high cholesterol, or both.  Not getting enough exercise or physical activity.  Being overweight.  Having too much fat, sugar, calories, or salt (sodium) in your diet.  Drinking too much alcohol. Some risk factors for high blood pressure may be difficult or impossible to change. Some of these factors include:  Having chronic kidney disease.  Having a family history of high blood pressure.  Age. Risk increases with age.  Race. You may be at higher risk if you are African American.  Gender. Men are at higher risk than  women before age 19. After age 79, women are at higher risk than men.  Having obstructive sleep apnea.  Stress. What are the signs or symptoms? High blood  pressure may not cause symptoms. Very high blood pressure (hypertensive crisis) may cause:  Headache.  Anxiety.  Shortness of breath.  Nosebleed.  Nausea and vomiting.  Vision changes.  Severe chest pain.  Seizures. How is this diagnosed? This condition is diagnosed by measuring your blood pressure while you are seated, with your arm resting on a flat surface, your legs uncrossed, and your feet flat on the floor. The cuff of the blood pressure monitor will be placed directly against the skin of your upper arm at the level of your heart. It should be measured at least twice using the same arm. Certain conditions can cause a difference in blood pressure between your right and left arms. Certain factors can cause blood pressure readings to be lower or higher than normal for a short period of time:  When your blood pressure is higher when you are in a health care provider's office than when you are at home, this is called white coat hypertension. Most people with this condition do not need medicines.  When your blood pressure is higher at home than when you are in a health care provider's office, this is called masked hypertension. Most people with this condition may need medicines to control blood pressure. If you have a high blood pressure reading during one visit or you have normal blood pressure with other risk factors, you may be asked to:  Return on a different day to have your blood pressure checked again.  Monitor your blood pressure at home for 1 week or longer. If you are diagnosed with hypertension, you may have other blood or imaging tests to help your health care provider understand your overall risk for other conditions. How is this treated? This condition is treated by making healthy lifestyle changes, such as eating healthy foods, exercising more, and reducing your alcohol intake. Your health care provider may prescribe medicine if lifestyle changes are not enough to get  your blood pressure under control, and if:  Your systolic blood pressure is above 130.  Your diastolic blood pressure is above 80. Your personal target blood pressure may vary depending on your medical conditions, your age, and other factors. Follow these instructions at home: Eating and drinking   Eat a diet that is high in fiber and potassium, and low in sodium, added sugar, and fat. An example eating plan is called the DASH (Dietary Approaches to Stop Hypertension) diet. To eat this way: ? Eat plenty of fresh fruits and vegetables. Try to fill one half of your plate at each meal with fruits and vegetables. ? Eat whole grains, such as whole-wheat pasta, brown rice, or whole-grain bread. Fill about one fourth of your plate with whole grains. ? Eat or drink low-fat dairy products, such as skim milk or low-fat yogurt. ? Avoid fatty cuts of meat, processed or cured meats, and poultry with skin. Fill about one fourth of your plate with lean proteins, such as fish, chicken without skin, beans, eggs, or tofu. ? Avoid pre-made and processed foods. These tend to be higher in sodium, added sugar, and fat.  Reduce your daily sodium intake. Most people with hypertension should eat less than 1,500 mg of sodium a day.  Do not drink alcohol if: ? Your health care provider tells you not to drink. ? You are pregnant,  may be pregnant, or are planning to become pregnant.  If you drink alcohol: ? Limit how much you use to:  0-1 drink a day for women.  0-2 drinks a day for men. ? Be aware of how much alcohol is in your drink. In the [place] one drink equals one 12 oz bottle of beer (355 mL), one 5 oz glass of wine (148 mL), or one 1 oz glass of hard liquor (44 mL). Lifestyle   Work with your health care provider to maintain a healthy body weight or to lose weight. Ask what an ideal weight is for you.  Get at least 30 minutes of exercise most days of the week. Activities may include walking, swimming,  or biking.  Include exercise to strengthen your muscles (resistance exercise), such as Pilates or lifting weights, as part of your weekly exercise routine. Try to do these types of exercises for 30 minutes at least 3 days a week.  Do not use any products that contain nicotine or tobacco, such as cigarettes, e-cigarettes, and chewing tobacco. If you need help quitting, ask your health care provider.  Monitor your blood pressure at home as told by your health care provider.  Keep all follow-up visits as told by your health care provider. This is important. Medicines  Take over-the-counter and prescription medicines only as told by your health care provider. Follow directions carefully. Blood pressure medicines must be taken as prescribed.  Do not skip doses of blood pressure medicine. Doing this puts you at risk for problems and can make the medicine less effective.  Ask your health care provider about side effects or reactions to medicines that you should watch for. Contact a health care provider if you:  Think you are having a reaction to a medicine you are taking.  Have headaches that keep coming back (recurring).  Feel dizzy.  Have swelling in your ankles.  Have trouble with your vision. Get help right away if you:  Develop a severe headache or confusion.  Have unusual weakness or numbness.  Feel faint.  Have severe pain in your chest or abdomen.  Vomit repeatedly.  Have trouble breathing. Summary  Hypertension is when the force of blood pumping through your arteries is too strong. If this condition is not controlled, it may put you at risk for serious complications.  Your personal target blood pressure may vary depending on your medical conditions, your age, and other factors. For most people, a normal blood pressure is less than 120/80.  Hypertension is treated with lifestyle changes, medicines, or a combination of both. Lifestyle changes include losing weight,  eating a healthy, low-sodium diet, exercising more, and limiting alcohol. This information is not intended to replace advice given to you by your health care provider. Make sure you discuss any questions you have with your health care provider. Document Revised: 11/20/2017 Document Reviewed: 11/20/2017 Elsevier Patient Education  2020 [employer]      Agustina Caroli, MD [facility]

## 2019-09-26 ENCOUNTER — Other Ambulatory Visit: Payer: Self-pay | Admitting: Emergency Medicine

## 2019-09-26 DIAGNOSIS — I10 Essential (primary) hypertension: Secondary | ICD-10-CM

## 2019-10-01 DIAGNOSIS — M9903 Segmental and somatic dysfunction of lumbar region: Secondary | ICD-10-CM | POA: Diagnosis not present

## 2019-10-01 DIAGNOSIS — M545 Low back pain: Secondary | ICD-10-CM | POA: Diagnosis not present

## 2019-10-01 DIAGNOSIS — M62830 Muscle spasm of back: Secondary | ICD-10-CM | POA: Diagnosis not present

## 2019-10-01 DIAGNOSIS — M9901 Segmental and somatic dysfunction of cervical region: Secondary | ICD-10-CM | POA: Diagnosis not present

## 2019-10-12 DIAGNOSIS — Z6832 Body mass index (BMI) 32.0-32.9, adult: Secondary | ICD-10-CM | POA: Diagnosis not present

## 2019-10-12 DIAGNOSIS — N72 Inflammatory disease of cervix uteri: Secondary | ICD-10-CM | POA: Diagnosis not present

## 2019-10-12 DIAGNOSIS — Z3202 Encounter for pregnancy test, result negative: Secondary | ICD-10-CM | POA: Diagnosis not present

## 2019-10-12 DIAGNOSIS — R87810 Cervical high risk human papillomavirus (HPV) DNA test positive: Secondary | ICD-10-CM | POA: Diagnosis not present

## 2019-10-13 DIAGNOSIS — M62830 Muscle spasm of back: Secondary | ICD-10-CM | POA: Diagnosis not present

## 2019-10-13 DIAGNOSIS — M9903 Segmental and somatic dysfunction of lumbar region: Secondary | ICD-10-CM | POA: Diagnosis not present

## 2019-10-13 DIAGNOSIS — M9901 Segmental and somatic dysfunction of cervical region: Secondary | ICD-10-CM | POA: Diagnosis not present

## 2019-10-13 DIAGNOSIS — M545 Low back pain: Secondary | ICD-10-CM | POA: Diagnosis not present

## 2019-12-21 ENCOUNTER — Other Ambulatory Visit: Payer: Self-pay

## 2019-12-21 ENCOUNTER — Encounter: Payer: Self-pay | Admitting: Emergency Medicine

## 2019-12-21 ENCOUNTER — Ambulatory Visit (INDEPENDENT_AMBULATORY_CARE_PROVIDER_SITE_OTHER): Payer: BC Managed Care – PPO | Admitting: Emergency Medicine

## 2019-12-21 DIAGNOSIS — I10 Essential (primary) hypertension: Secondary | ICD-10-CM

## 2019-12-21 NOTE — Patient Instructions (Addendum)
   If you have lab work done today you will be contacted with your lab results within the next 2 weeks.  If you have not heard from us then please contact us. The fastest way to get your results is to register for My Chart.   IF you received an x-ray today, you will receive an invoice from [facility]. Please contact [facility] at [phone] with questions or concerns regarding your invoice.   IF you received labwork today, you will receive an invoice from [facility]. Please contact [facility] at [phone] with questions or concerns regarding your invoice.   Our billing staff will not be able to assist you with questions regarding bills from these companies.  You will be contacted with the lab results as soon as they are available. The fastest way to get your results is to activate your My Chart account. Instructions are located on the last page of this paperwork. If you have not heard from us regarding the results in 2 weeks, please contact this office.      Hypertension, Adult High blood pressure (hypertension) is when the force of blood pumping through the arteries is too strong. The arteries are the blood vessels that carry blood from the heart throughout the body. Hypertension forces the heart to work harder to pump blood and may cause arteries to become narrow or stiff. Untreated or uncontrolled hypertension can cause a heart attack, heart failure, a stroke, kidney disease, and other problems. A blood pressure reading consists of a higher number over a lower number. Ideally, your blood pressure should be below 120/80. The first ("top") number is called the systolic pressure. It is a measure of the pressure in your arteries as your heart beats. The second ("bottom") number is called the diastolic pressure. It is a measure of the pressure in your arteries as the heart relaxes. What are the causes? The exact cause of this condition is not known. There are some conditions  that result in or are related to high blood pressure. What increases the risk? Some risk factors for high blood pressure are under your control. The following factors may make you more likely to develop this condition:  Smoking.  Having type 2 diabetes mellitus, high cholesterol, or both.  Not getting enough exercise or physical activity.  Being overweight.  Having too much fat, sugar, calories, or salt (sodium) in your diet.  Drinking too much alcohol. Some risk factors for high blood pressure may be difficult or impossible to change. Some of these factors include:  Having chronic kidney disease.  Having a family history of high blood pressure.  Age. Risk increases with age.  Race. You may be at higher risk if you are African American.  Gender. Men are at higher risk than women before age 45. After age 65, women are at higher risk than men.  Having obstructive sleep apnea.  Stress. What are the signs or symptoms? High blood pressure may not cause symptoms. Very high blood pressure (hypertensive crisis) may cause:  Headache.  Anxiety.  Shortness of breath.  Nosebleed.  Nausea and vomiting.  Vision changes.  Severe chest pain.  Seizures. How is this diagnosed? This condition is diagnosed by measuring your blood pressure while you are seated, with your arm resting on a flat surface, your legs uncrossed, and your feet flat on the floor. The cuff of the blood pressure monitor will be placed directly against the skin of your upper arm at the level of your   heart. It should be measured at least twice using the same arm. Certain conditions can cause a difference in blood pressure between your right and left arms. Certain factors can cause blood pressure readings to be lower or higher than normal for a short period of time:  When your blood pressure is higher when you are in a health care provider's office than when you are at home, this is called white coat hypertension.  Most people with this condition do not need medicines.  When your blood pressure is higher at home than when you are in a health care provider's office, this is called masked hypertension. Most people with this condition may need medicines to control blood pressure. If you have a high blood pressure reading during one visit or you have normal blood pressure with other risk factors, you may be asked to:  Return on a different day to have your blood pressure checked again.  Monitor your blood pressure at home for 1 week or longer. If you are diagnosed with hypertension, you may have other blood or imaging tests to help your health care provider understand your overall risk for other conditions. How is this treated? This condition is treated by making healthy lifestyle changes, such as eating healthy foods, exercising more, and reducing your alcohol intake. Your health care provider may prescribe medicine if lifestyle changes are not enough to get your blood pressure under control, and if:  Your systolic blood pressure is above 130.  Your diastolic blood pressure is above 80. Your personal target blood pressure may vary depending on your medical conditions, your age, and other factors. Follow these instructions at home: Eating and drinking   Eat a diet that is high in fiber and potassium, and low in sodium, added sugar, and fat. An example eating plan is called the DASH (Dietary Approaches to Stop Hypertension) diet. To eat this way: ? Eat plenty of fresh fruits and vegetables. Try to fill one half of your plate at each meal with fruits and vegetables. ? Eat whole grains, such as whole-wheat pasta, brown rice, or whole-grain bread. Fill about one fourth of your plate with whole grains. ? Eat or drink low-fat dairy products, such as skim milk or low-fat yogurt. ? Avoid fatty cuts of meat, processed or cured meats, and poultry with skin. Fill about one fourth of your plate with lean proteins, such  as fish, chicken without skin, beans, eggs, or tofu. ? Avoid pre-made and processed foods. These tend to be higher in sodium, added sugar, and fat.  Reduce your daily sodium intake. Most people with hypertension should eat less than 1,500 mg of sodium a day.  Do not drink alcohol if: ? Your health care provider tells you not to drink. ? You are pregnant, may be pregnant, or are planning to become pregnant.  If you drink alcohol: ? Limit how much you use to:  0-1 drink a day for women.  0-2 drinks a day for men. ? Be aware of how much alcohol is in your drink. In the [place] one drink equals one 12 oz bottle of beer (355 mL), one 5 oz glass of wine (148 mL), or one 1 oz glass of hard liquor (44 mL). Lifestyle   Work with your health care provider to maintain a healthy body weight or to lose weight. Ask what an ideal weight is for you.  Get at least 30 minutes of exercise most days of the week. Activities may include walking, swimming,   or biking.  Include exercise to strengthen your muscles (resistance exercise), such as Pilates or lifting weights, as part of your weekly exercise routine. Try to do these types of exercises for 30 minutes at least 3 days a week.  Do not use any products that contain nicotine or tobacco, such as cigarettes, e-cigarettes, and chewing tobacco. If you need help quitting, ask your health care provider.  Monitor your blood pressure at home as told by your health care provider.  Keep all follow-up visits as told by your health care provider. This is important. Medicines  Take over-the-counter and prescription medicines only as told by your health care provider. Follow directions carefully. Blood pressure medicines must be taken as prescribed.  Do not skip doses of blood pressure medicine. Doing this puts you at risk for problems and can make the medicine less effective.  Ask your health care provider about side effects or reactions to medicines that you  should watch for. Contact a health care provider if you:  Think you are having a reaction to a medicine you are taking.  Have headaches that keep coming back (recurring).  Feel dizzy.  Have swelling in your ankles.  Have trouble with your vision. Get help right away if you:  Develop a severe headache or confusion.  Have unusual weakness or numbness.  Feel faint.  Have severe pain in your chest or abdomen.  Vomit repeatedly.  Have trouble breathing. Summary  Hypertension is when the force of blood pumping through your arteries is too strong. If this condition is not controlled, it may put you at risk for serious complications.  Your personal target blood pressure may vary depending on your medical conditions, your age, and other factors. For most people, a normal blood pressure is less than 120/80.  Hypertension is treated with lifestyle changes, medicines, or a combination of both. Lifestyle changes include losing weight, eating a healthy, low-sodium diet, exercising more, and limiting alcohol. This information is not intended to replace advice given to you by your health care provider. Make sure you discuss any questions you have with your health care provider. Document Revised: 11/20/2017 Document Reviewed: 11/20/2017 [employer] Patient Education  2020 [employer]  

## 2019-12-21 NOTE — Assessment & Plan Note (Signed)
Well-controlled hypertension.  Continue present medications.  No changes. Diet and nutrition discussed. Follow-up in 6 months. 

## 2019-12-21 NOTE — Progress Notes (Signed)
Emily Downs 39 y.o.   Chief Complaint  Patient presents with  . Hypertension    3 m f/u    HISTORY OF PRESENT ILLNESS: This is a 39 y.o. female with history of hypertension here for 58-month follow-up. Presently taking amlodipine 5 mg daily and metoprolol succinate 100 mg daily.  Doing well.  Has no complaints. Fully vaccinated against Covid.  HPI   Prior to Admission medications   Medication Sig Start Date End Date Taking? Authorizing Provider  amLODipine (NORVASC) 10 MG tablet Take 10 mg by mouth daily. 09/26/19  Yes [provider]  cetirizine (ZYRTEC) 10 MG tablet Take 10 mg by mouth daily.   Yes [provider]  metoprolol succinate (TOPROL-XL) 100 MG 24 hr tablet Take 100 mg by mouth daily. Take with or immediately following a meal.   Yes [provider]  Prenatal Vit-Fe Fumarate-FA (PRENATAL PLUS/IRON) 27-1 MG TABS Take 1 tablet by mouth daily.    Yes [provider]  FENUGREEK PO Take 1 capsule by mouth daily.     [provider]    No Known Allergies  Patient Active Problem List   Diagnosis Date Noted  . S/P cesarean section 07/26/2019  . Benign essential hypertension antepartum in third trimester 07/23/2019  . Near syncope 06/27/2019  . Dehydration during pregnancy 06/27/2019  . Essential hypertension, benign 12/11/2013  . Menometrorrhagia   . Iron deficiency anemia     Past Medical History:  Diagnosis Date  . Allergy   . Beta thalassemia trait   . Beta thalassemia trait   . Genital herpes   . Gestational diabetes   . HPV (human papilloma virus) anogenital infection   . Hx of chlamydia infection   . Hx of pre-eclampsia in prior pregnancy, currently pregnant   . Hypertension   . Iron deficiency anemia   . Menometrorrhagia     Past Surgical History:  Procedure Laterality Date  . CESAREAN SECTION N/A 07/23/2019   Procedure: CESAREAN SECTION;  Surgeon: Jerelyn Charles, MD;  Location: [facility];  Service:  Obstetrics;  Laterality: N/A;  . COLPOSCOPY    . DILATION AND EVACUATION  2009  . DILATION AND EVACUATION N/A 11/08/2015   Procedure: DILATATION AND EVACUATION WITH CHROMASOME STUDIES;  Surgeon: Servando Salina, MD;  Location: [facility];  Service: Gynecology;  Laterality: N/A;    Social History   Socioeconomic History  . Marital status: Married    Spouse name: Glennys Schorsch  . Number of children: 1  . Years of education: MBA  . Highest education level: Not on file  Occupational History  . Occupation: Probation officer  . Occupation: residence Charity fundraiser    Comment: [employer] [place] (04/23/2016)  Tobacco Use  . Smoking status: Never Smoker  . Smokeless tobacco: Never Used  Vaping Use  . Vaping Use: Never used  Substance and Sexual Activity  . Alcohol use: No  . Drug use: No  . Sexual activity: Not Currently    Birth control/protection: None  Other Topics Concern  . Not on file  Social History Narrative   Marital status: married x 7 years      Children: 57 yo daughter.      Lives: with husband, daughter.      Employment:  Theme park manager, Residence Charity fundraiser      Tobacco; none      Alcohol: rare to none in 2018      Drugs:  None      Exercise: none in 2018  Seatbelt: 100%   Social Determinants of Health   Financial Resource Strain:   . Difficulty of Paying Living Expenses: Not on file  Food Insecurity:   . Worried About Charity fundraiser in the Last Year: Not on file  . Ran Out of Food in the Last Year: Not on file  Transportation Needs:   . Lack of Transportation (Medical): Not on file  . Lack of Transportation (Non-Medical): Not on file  Physical Activity:   . Days of Exercise per Week: Not on file  . Minutes of Exercise per Session: Not on file  Stress:   . Feeling of Stress : Not on file  Social Connections:   . Frequency of Communication with Friends and Family: Not on file  . Frequency of Social Gatherings with Friends and Family: Not on file  . Attends  Religious Services: Not on file  . Active Member of Clubs or Organizations: Not on file  . Attends Archivist Meetings: Not on file  . Marital Status: Not on file  Intimate Partner Violence:   . Fear of Current or Ex-Partner: Not on file  . Emotionally Abused: Not on file  . Physically Abused: Not on file  . Sexually Abused: Not on file    Family History  Problem Relation Age of Onset  . Hypertension Mother   . Diabetes Mother   . Aneurysm Mother 9       brain aneurysm  . Sarcoidosis Mother   . Hyperlipidemia Father   . Diabetes Maternal Grandmother   . Stroke Maternal Grandmother   . Cancer Paternal Grandmother   . Crohn's disease Sister   . Hypertension Sister      Review of Systems  Constitutional: Negative.  Negative for chills and fever.  HENT: Negative.  Negative for congestion and sore throat.   Respiratory: Negative.  Negative for cough and shortness of breath.   Cardiovascular: Negative.  Negative for chest pain and palpitations.  Gastrointestinal: Negative.  Negative for abdominal pain, diarrhea, nausea and vomiting.  Genitourinary: Negative.  Negative for dysuria and hematuria.  Skin: Negative.  Negative for rash.  Neurological: Negative for dizziness and headaches.  All other systems reviewed and are negative.   Today's Vitals   12/21/19 1410 12/21/19 1418  BP: (!) 145/90 128/86  Pulse: 77   Temp: 98 F (36.7 C)   TempSrc: Temporal   SpO2: 99%   Weight: 169 lb 3.2 oz (76.7 kg)   Height: 5' (1.524 m)    Body mass index is 33.04 kg/m. BP Readings from Last 3 Encounters:  12/21/19 128/86  09/21/19 132/85  07/26/19 (!) 141/90    Physical Exam Vitals reviewed.  Constitutional:      Appearance: Normal appearance.  HENT:     Head: Normocephalic.  Eyes:     Extraocular Movements: Extraocular movements intact.     Pupils: Pupils are equal, round, and reactive to light.  Cardiovascular:     Rate and Rhythm: Normal rate and regular  rhythm.     Pulses: Normal pulses.     Heart sounds: Normal heart sounds.  Pulmonary:     Effort: Pulmonary effort is normal.     Breath sounds: Normal breath sounds.  Musculoskeletal:        General: Normal range of motion.     Cervical back: Normal range of motion and neck supple.  Skin:    General: Skin is warm and dry.     Capillary Refill: Capillary refill  takes less than 2 seconds.  Neurological:     General: No focal deficit present.     Mental Status: She is alert and oriented to person, place, and time.  Psychiatric:        Mood and Affect: Mood normal.        Behavior: Behavior normal.      ASSESSMENT & PLAN:  Essential hypertension, benign Well-controlled hypertension.  Continue present medications.  No changes. Diet and nutrition discussed. Follow-up in 6 months.  Deasiah was seen today for hypertension.  Diagnoses and all orders for this visit:  Essential hypertension, benign    Patient Instructions       If you have lab work done today you will be contacted with your lab results within the next 2 weeks.  If you have not heard from [place] then please contact us. The fastest way to get your results is to register for My Chart.   IF you received an x-ray today, you will receive an invoice from [facility]. Please contact [facility] at [phone] with questions or concerns regarding your invoice.   IF you received labwork today, you will receive an invoice from [facility]. Please contact [facility] at [phone] with questions or concerns regarding your invoice.   Our billing staff will not be able to assist you with questions regarding bills from these companies.  You will be contacted with the lab results as soon as they are available. The fastest way to get your results is to activate your My Chart account. Instructions are located on the last page of this paperwork. If you have not heard from [place] regarding the results in 2 weeks, please  contact this office.      Hypertension, Adult High blood pressure (hypertension) is when the force of blood pumping through the arteries is too strong. The arteries are the blood vessels that carry blood from the heart throughout the body. Hypertension forces the heart to work harder to pump blood and may cause arteries to become narrow or stiff. Untreated or uncontrolled hypertension can cause a heart attack, heart failure, a stroke, kidney disease, and other problems. A blood pressure reading consists of a higher number over a lower number. Ideally, your blood pressure should be below 120/80. The first ("top") number is called the systolic pressure. It is a measure of the pressure in your arteries as your heart beats. The second ("bottom") number is called the diastolic pressure. It is a measure of the pressure in your arteries as the heart relaxes. What are the causes? The exact cause of this condition is not known. There are some conditions that result in or are related to high blood pressure. What increases the risk? Some risk factors for high blood pressure are under your control. The following factors may make you more likely to develop this condition:  Smoking.  Having type 2 diabetes mellitus, high cholesterol, or both.  Not getting enough exercise or physical activity.  Being overweight.  Having too much fat, sugar, calories, or salt (sodium) in your diet.  Drinking too much alcohol. Some risk factors for high blood pressure may be difficult or impossible to change. Some of these factors include:  Having chronic kidney disease.  Having a family history of high blood pressure.  Age. Risk increases with age.  Race. You may be at higher risk if you are African American.  Gender. Men are at higher risk than women before age 47. After age 36, women are at higher  risk than men.  Having obstructive sleep apnea.  Stress. What are the signs or symptoms? High blood pressure  may not cause symptoms. Very high blood pressure (hypertensive crisis) may cause:  Headache.  Anxiety.  Shortness of breath.  Nosebleed.  Nausea and vomiting.  Vision changes.  Severe chest pain.  Seizures. How is this diagnosed? This condition is diagnosed by measuring your blood pressure while you are seated, with your arm resting on a flat surface, your legs uncrossed, and your feet flat on the floor. The cuff of the blood pressure monitor will be placed directly against the skin of your upper arm at the level of your heart. It should be measured at least twice using the same arm. Certain conditions can cause a difference in blood pressure between your right and left arms. Certain factors can cause blood pressure readings to be lower or higher than normal for a short period of time:  When your blood pressure is higher when you are in a health care provider's office than when you are at home, this is called white coat hypertension. Most people with this condition do not need medicines.  When your blood pressure is higher at home than when you are in a health care provider's office, this is called masked hypertension. Most people with this condition may need medicines to control blood pressure. If you have a high blood pressure reading during one visit or you have normal blood pressure with other risk factors, you may be asked to:  Return on a different day to have your blood pressure checked again.  Monitor your blood pressure at home for 1 week or longer. If you are diagnosed with hypertension, you may have other blood or imaging tests to help your health care provider understand your overall risk for other conditions. How is this treated? This condition is treated by making healthy lifestyle changes, such as eating healthy foods, exercising more, and reducing your alcohol intake. Your health care provider may prescribe medicine if lifestyle changes are not enough to get your blood  pressure under control, and if:  Your systolic blood pressure is above 130.  Your diastolic blood pressure is above 80. Your personal target blood pressure may vary depending on your medical conditions, your age, and other factors. Follow these instructions at home: Eating and drinking   Eat a diet that is high in fiber and potassium, and low in sodium, added sugar, and fat. An example eating plan is called the DASH (Dietary Approaches to Stop Hypertension) diet. To eat this way: ? Eat plenty of fresh fruits and vegetables. Try to fill one half of your plate at each meal with fruits and vegetables. ? Eat whole grains, such as whole-wheat pasta, brown rice, or whole-grain bread. Fill about one fourth of your plate with whole grains. ? Eat or drink low-fat dairy products, such as skim milk or low-fat yogurt. ? Avoid fatty cuts of meat, processed or cured meats, and poultry with skin. Fill about one fourth of your plate with lean proteins, such as fish, chicken without skin, beans, eggs, or tofu. ? Avoid pre-made and processed foods. These tend to be higher in sodium, added sugar, and fat.  Reduce your daily sodium intake. Most people with hypertension should eat less than 1,500 mg of sodium a day.  Do not drink alcohol if: ? Your health care provider tells you not to drink. ? You are pregnant, may be pregnant, or are planning to become pregnant.  If  you drink alcohol: ? Limit how much you use to:  0-1 drink a day for women.  0-2 drinks a day for men. ? Be aware of how much alcohol is in your drink. In the [place] one drink equals one 12 oz bottle of beer (355 mL), one 5 oz glass of wine (148 mL), or one 1 oz glass of hard liquor (44 mL). Lifestyle   Work with your health care provider to maintain a healthy body weight or to lose weight. Ask what an ideal weight is for you.  Get at least 30 minutes of exercise most days of the week. Activities may include walking, swimming, or  biking.  Include exercise to strengthen your muscles (resistance exercise), such as Pilates or lifting weights, as part of your weekly exercise routine. Try to do these types of exercises for 30 minutes at least 3 days a week.  Do not use any products that contain nicotine or tobacco, such as cigarettes, e-cigarettes, and chewing tobacco. If you need help quitting, ask your health care provider.  Monitor your blood pressure at home as told by your health care provider.  Keep all follow-up visits as told by your health care provider. This is important. Medicines  Take over-the-counter and prescription medicines only as told by your health care provider. Follow directions carefully. Blood pressure medicines must be taken as prescribed.  Do not skip doses of blood pressure medicine. Doing this puts you at risk for problems and can make the medicine less effective.  Ask your health care provider about side effects or reactions to medicines that you should watch for. Contact a health care provider if you:  Think you are having a reaction to a medicine you are taking.  Have headaches that keep coming back (recurring).  Feel dizzy.  Have swelling in your ankles.  Have trouble with your vision. Get help right away if you:  Develop a severe headache or confusion.  Have unusual weakness or numbness.  Feel faint.  Have severe pain in your chest or abdomen.  Vomit repeatedly.  Have trouble breathing. Summary  Hypertension is when the force of blood pumping through your arteries is too strong. If this condition is not controlled, it may put you at risk for serious complications.  Your personal target blood pressure may vary depending on your medical conditions, your age, and other factors. For most people, a normal blood pressure is less than 120/80.  Hypertension is treated with lifestyle changes, medicines, or a combination of both. Lifestyle changes include losing weight, eating a  healthy, low-sodium diet, exercising more, and limiting alcohol. This information is not intended to replace advice given to you by your health care provider. Make sure you discuss any questions you have with your health care provider. Document Revised: 11/20/2017 Document Reviewed: 11/20/2017 Elsevier Patient Education  2020 Elsevier Inc.      Agustina Caroli, MD [facility]

## 2020-04-05 DIAGNOSIS — Z1152 Encounter for screening for COVID-19: Secondary | ICD-10-CM | POA: Diagnosis not present

## 2020-04-12 ENCOUNTER — Encounter: Payer: Self-pay | Admitting: Emergency Medicine

## 2020-04-12 ENCOUNTER — Other Ambulatory Visit: Payer: Self-pay

## 2020-04-12 ENCOUNTER — Telehealth (INDEPENDENT_AMBULATORY_CARE_PROVIDER_SITE_OTHER): Payer: BC Managed Care – PPO | Admitting: Emergency Medicine

## 2020-04-12 DIAGNOSIS — R6889 Other general symptoms and signs: Secondary | ICD-10-CM

## 2020-04-12 DIAGNOSIS — Z20822 Contact with and (suspected) exposure to covid-19: Secondary | ICD-10-CM | POA: Diagnosis not present

## 2020-04-12 DIAGNOSIS — R059 Cough, unspecified: Secondary | ICD-10-CM | POA: Diagnosis not present

## 2020-04-12 DIAGNOSIS — R0981 Nasal congestion: Secondary | ICD-10-CM | POA: Diagnosis not present

## 2020-04-12 MED ORDER — BENZONATATE 200 MG PO CAPS: 200.0000 mg | ORAL_CAPSULE | Freq: Two times a day (BID) | ORAL | 0 refills | Status: DC | PRN

## 2020-04-12 MED ORDER — PROMETHAZINE-DM 6.25-15 MG/5ML PO SYRP: 5.0000 mL | ORAL_SOLUTION | Freq: Four times a day (QID) | ORAL | 0 refills | Status: DC | PRN

## 2020-04-12 NOTE — Progress Notes (Signed)
Telemedicine Encounter- SOAP NOTE Established Patient Patient: Home  Provider: Office     This telephone encounter was conducted with the patient's (or proxy's) verbal consent via audio telecommunications: yes/no: Yes Patient was instructed to have this encounter in a suitably private space; and to only have persons present to whom they give permission to participate. In addition, patient identity was confirmed by use of name plus two identifiers (DOB and address).  I discussed the limitations, risks, security and privacy concerns of performing an evaluation and management service by telephone and the availability of in person appointments. I also discussed with the patient that there may be a patient responsible charge related to this service. The patient expressed understanding and agreed to proceed.  I spent a total of TIME; 0 MIN TO 60 MIN: 20 minutes talking with the patient or their proxy.  Chief Complaint  Patient presents with  . Cough    Pt having stuffiness, chest congestion and runny nose. Had some muscle aches and chills last wk. Did pcr last Tuesday, result neg. Taking coricidin for the cough. Cough gets worse at night while lying down     Subjective   Emily Downs is a 40 y.o. female established patient. Telephone visit today complaining of flulike symptoms mostly nasal congestion and cough that started 1 week ago.  She tested negative for COVID but her husband was positive for COVID and one of her kids also had flulike symptoms last week.  Denies difficulty breathing or chest pain.  Able to eat and drink.  Denies nausea or vomiting.  Denies abdominal pain or diarrhea.  Denies any other significant symptoms.  Fully vaccinated against COVID.  HPI   Patient Active Problem List   Diagnosis Date Noted  . S/P cesarean section 07/26/2019  . Benign essential hypertension antepartum in third trimester 07/23/2019  . Near syncope 06/27/2019  . Dehydration during pregnancy  06/27/2019  . Essential hypertension, benign 12/11/2013  . Menometrorrhagia   . Iron deficiency anemia     Past Medical History:  Diagnosis Date  . Allergy   . Beta thalassemia trait   . Beta thalassemia trait   . Genital herpes   . Gestational diabetes   . HPV (human papilloma virus) anogenital infection   . Hx of chlamydia infection   . Hx of pre-eclampsia in prior pregnancy, currently pregnant   . Hypertension   . Iron deficiency anemia   . Menometrorrhagia     Current Outpatient Medications  Medication Sig Dispense Refill  . amLODipine (NORVASC) 10 MG tablet Take 10 mg by mouth daily.    . cetirizine (ZYRTEC) 10 MG tablet Take 10 mg by mouth daily.    . metoprolol succinate (TOPROL-XL) 100 MG 24 hr tablet Take 100 mg by mouth daily. Take with or immediately following a meal.    . Prenatal Vit-Fe Fumarate-FA (PRENATAL PLUS/IRON) 27-1 MG TABS Take 1 tablet by mouth daily.      No current facility-administered medications for this visit.    No Known Allergies  Social History   Socioeconomic History  . Marital status: Married    Spouse name: Sammantha Mehlhaff  . Number of children: 1  . Years of education: MBA  . Highest education level: Not on file  Occupational History  . Occupation: Probation officer  . Occupation: residence Charity fundraiser    Comment: [place] (04/23/2016)  Tobacco Use  . Smoking status: Never Smoker  . Smokeless tobacco: Never Used  Vaping Use  .  Vaping Use: Never used  Substance and Sexual Activity  . Alcohol use: No  . Drug use: No  . Sexual activity: Not Currently    Birth control/protection: None  Other Topics Concern  . Not on file  Social History Narrative   Marital status: married x 7 years      Children: 47 yo daughter.      Lives: with husband, daughter.      Employment:  Theme park manager, Residence Charity fundraiser      Tobacco; none      Alcohol: rare to none in 2018      Drugs:  None      Exercise: none in 2018      Seatbelt: 100%    Social Determinants of Radio broadcast assistant Strain: Not on file  Food Insecurity: Not on file  Transportation Needs: Not on file  Physical Activity: Not on file  Stress: Not on file  Social Connections: Not on file  Intimate Partner Violence: Not on file    Review of Systems  Constitutional: Negative.  Negative for chills and fever.  HENT: Positive for congestion. Negative for sore throat.   Respiratory: Positive for cough. Negative for shortness of breath.   Cardiovascular: Negative.  Negative for chest pain and palpitations.  Gastrointestinal: Negative.  Negative for abdominal pain, diarrhea, nausea and vomiting.  Genitourinary: Negative.  Negative for dysuria and hematuria.  Musculoskeletal: Negative.  Negative for back pain, myalgias and neck pain.  Skin: Negative.  Negative for rash.  Neurological: Negative.  Negative for dizziness and headaches.  All other systems reviewed and are negative.   Objective  Alert and oriented x3 in no apparent respiratory distress. Vitals as reported by the patient: There were no vitals filed for this visit.  There are no diagnoses linked to this encounter.  Babara was seen today for cough.  Diagnoses and all orders for this visit:  Flu-like symptoms  Cough -     benzonatate (TESSALON) 200 MG capsule; Take 1 capsule (200 mg total) by mouth 2 (two) times daily as needed for cough. -     promethazine-dextromethorphan (PROMETHAZINE-DM) 6.25-15 MG/5ML syrup; Take 5 mLs by mouth 4 (four) times daily as needed for cough.  Nasal congestion  Suspected COVID-19 virus infection    Clinically stable.  No red flag signs or symptoms.  Very likely this patient has COVID infection.  Symptomatic treatment. COVID advice given.  ED precautions given. Medications as prescribed to treat cough. Advised to contact the office if no better or worse during the next several days.  I discussed the assessment and treatment plan with the patient.  The patient was provided an opportunity to ask questions and all were answered. The patient agreed with the plan and demonstrated an understanding of the instructions.   The patient was advised to call back or seek an in-person evaluation if the symptoms worsen or if the condition fails to improve as anticipated.  I provided 20 minutes of non-face-to-face time during this encounter.  Horald Pollen, MD  Primary [facility] at [facility]

## 2020-04-13 DIAGNOSIS — Z1152 Encounter for screening for COVID-19: Secondary | ICD-10-CM | POA: Diagnosis not present

## 2020-06-01 ENCOUNTER — Ambulatory Visit
Admission: RE | Admit: 2020-06-01 | Discharge: 2020-06-01 | Disposition: A | Discharge status: Home / Self Care | Payer: BC Managed Care – PPO | Source: Ambulatory Visit | Attending: Emergency Medicine | Admitting: Emergency Medicine

## 2020-06-01 ENCOUNTER — Ambulatory Visit (INDEPENDENT_AMBULATORY_CARE_PROVIDER_SITE_OTHER): Payer: BC Managed Care – PPO | Admitting: Emergency Medicine

## 2020-06-01 ENCOUNTER — Other Ambulatory Visit: Payer: Self-pay

## 2020-06-01 ENCOUNTER — Encounter: Payer: Self-pay | Admitting: Emergency Medicine

## 2020-06-01 VITALS — BP 116/75 | HR 71 | Temp 97.9°F | Resp 16 | Ht 60.0 in | Wt 161.0 lb

## 2020-06-01 DIAGNOSIS — M25562 Pain in left knee: Secondary | ICD-10-CM | POA: Diagnosis not present

## 2020-06-01 DIAGNOSIS — M7121 Synovial cyst of popliteal space [Baker], right knee: Secondary | ICD-10-CM | POA: Diagnosis not present

## 2020-06-01 DIAGNOSIS — G8929 Other chronic pain: Secondary | ICD-10-CM

## 2020-06-01 DIAGNOSIS — Z131 Encounter for screening for diabetes mellitus: Secondary | ICD-10-CM | POA: Diagnosis not present

## 2020-06-01 DIAGNOSIS — M25561 Pain in right knee: Secondary | ICD-10-CM

## 2020-06-01 DIAGNOSIS — I10 Essential (primary) hypertension: Secondary | ICD-10-CM | POA: Diagnosis not present

## 2020-06-01 DIAGNOSIS — M1711 Unilateral primary osteoarthritis, right knee: Secondary | ICD-10-CM | POA: Diagnosis not present

## 2020-06-01 DIAGNOSIS — R42 Dizziness and giddiness: Secondary | ICD-10-CM

## 2020-06-01 DIAGNOSIS — M25461 Effusion, right knee: Secondary | ICD-10-CM | POA: Diagnosis not present

## 2020-06-01 NOTE — Patient Instructions (Addendum)
Decrease dose of amlodipine to 5 mg daily. Continue metoprolol succinate 100 mg daily. Monitor blood pressure readings at home several times a day for the next couple weeks and keep a log.  Follow-up in 3 months but earlier as needed.     If you have lab work done today you will be contacted with your lab results within the next 2 weeks.  If you have not heard from [place] then please contact us. The fastest way to get your results is to register for My Chart.   IF you received an x-ray today, you will receive an invoice from [facility]. Please contact [facility] at [phone] with questions or concerns regarding your invoice.   IF you received labwork today, you will receive an invoice from [facility]. Please contact [facility] at [phone] with questions or concerns regarding your invoice.   Our billing staff will not be able to assist you with questions regarding bills from these companies.  You will be contacted with the lab results as soon as they are available. The fastest way to get your results is to activate your My Chart account. Instructions are located on the last page of this paperwork. If you have not heard from [place] regarding the results in 2 weeks, please contact this office.     Hypertension, Adult High blood pressure (hypertension) is when the force of blood pumping through the arteries is too strong. The arteries are the blood vessels that carry blood from the heart throughout the body. Hypertension forces the heart to work harder to pump blood and may cause arteries to become narrow or stiff. Untreated or uncontrolled hypertension can cause a heart attack, heart failure, a stroke, kidney disease, and other problems. A blood pressure reading consists of a higher number over a lower number. Ideally, your blood pressure should be below 120/80. The first ("top") number is called the systolic pressure. It is a measure of the pressure in your arteries as your heart  beats. The second ("bottom") number is called the diastolic pressure. It is a measure of the pressure in your arteries as the heart relaxes. What are the causes? The exact cause of this condition is not known. There are some conditions that result in or are related to high blood pressure. What increases the risk? Some risk factors for high blood pressure are under your control. The following factors may make you more likely to develop this condition:  Smoking.  Having type 2 diabetes mellitus, high cholesterol, or both.  Not getting enough exercise or physical activity.  Being overweight.  Having too much fat, sugar, calories, or salt (sodium) in your diet.  Drinking too much alcohol. Some risk factors for high blood pressure may be difficult or impossible to change. Some of these factors include:  Having chronic kidney disease.  Having a family history of high blood pressure.  Age. Risk increases with age.  Race. You may be at higher risk if you are African American.  Gender. Men are at higher risk than women before age 55. After age 49, women are at higher risk than men.  Having obstructive sleep apnea.  Stress. What are the signs or symptoms? High blood pressure may not cause symptoms. Very high blood pressure (hypertensive crisis) may cause:  Headache.  Anxiety.  Shortness of breath.  Nosebleed.  Nausea and vomiting.  Vision changes.  Severe chest pain.  Seizures. How is this diagnosed? This condition is diagnosed by measuring your blood pressure while you are  seated, with your arm resting on a flat surface, your legs uncrossed, and your feet flat on the floor. The cuff of the blood pressure monitor will be placed directly against the skin of your upper arm at the level of your heart. It should be measured at least twice using the same arm. Certain conditions can cause a difference in blood pressure between your right and left arms. Certain factors can cause  blood pressure readings to be lower or higher than normal for a short period of time:  When your blood pressure is higher when you are in a health care provider's office than when you are at home, this is called white coat hypertension. Most people with this condition do not need medicines.  When your blood pressure is higher at home than when you are in a health care provider's office, this is called masked hypertension. Most people with this condition may need medicines to control blood pressure. If you have a high blood pressure reading during one visit or you have normal blood pressure with other risk factors, you may be asked to:  Return on a different day to have your blood pressure checked again.  Monitor your blood pressure at home for 1 week or longer. If you are diagnosed with hypertension, you may have other blood or imaging tests to help your health care provider understand your overall risk for other conditions. How is this treated? This condition is treated by making healthy lifestyle changes, such as eating healthy foods, exercising more, and reducing your alcohol intake. Your health care provider may prescribe medicine if lifestyle changes are not enough to get your blood pressure under control, and if:  Your systolic blood pressure is above 130.  Your diastolic blood pressure is above 80. Your personal target blood pressure may vary depending on your medical conditions, your age, and other factors. Follow these instructions at home: Eating and drinking  Eat a diet that is high in fiber and potassium, and low in sodium, added sugar, and fat. An example eating plan is called the DASH (Dietary Approaches to Stop Hypertension) diet. To eat this way: ? Eat plenty of fresh fruits and vegetables. Try to fill one half of your plate at each meal with fruits and vegetables. ? Eat whole grains, such as whole-wheat pasta, brown rice, or whole-grain bread. Fill about one fourth of your  plate with whole grains. ? Eat or drink low-fat dairy products, such as skim milk or low-fat yogurt. ? Avoid fatty cuts of meat, processed or cured meats, and poultry with skin. Fill about one fourth of your plate with lean proteins, such as fish, chicken without skin, beans, eggs, or tofu. ? Avoid pre-made and processed foods. These tend to be higher in sodium, added sugar, and fat.  Reduce your daily sodium intake. Most people with hypertension should eat less than 1,500 mg of sodium a day.  Do not drink alcohol if: ? Your health care provider tells you not to drink. ? You are pregnant, may be pregnant, or are planning to become pregnant.  If you drink alcohol: ? Limit how much you use to:  0-1 drink a day for women.  0-2 drinks a day for men. ? Be aware of how much alcohol is in your drink. In the [place] one drink equals one 12 oz bottle of beer (355 mL), one 5 oz glass of wine (148 mL), or one 1 oz glass of hard liquor (44 mL).   Lifestyle  Work with your health care provider to maintain a healthy body weight or to lose weight. Ask what an ideal weight is for you.  Get at least 30 minutes of exercise most days of the week. Activities may include walking, swimming, or biking.  Include exercise to strengthen your muscles (resistance exercise), such as Pilates or lifting weights, as part of your weekly exercise routine. Try to do these types of exercises for 30 minutes at least 3 days a week.  Do not use any products that contain nicotine or tobacco, such as cigarettes, e-cigarettes, and chewing tobacco. If you need help quitting, ask your health care provider.  Monitor your blood pressure at home as told by your health care provider.  Keep all follow-up visits as told by your health care provider. This is important.   Medicines  Take over-the-counter and prescription medicines only as told by your health care provider. Follow directions carefully. Blood pressure medicines must be  taken as prescribed.  Do not skip doses of blood pressure medicine. Doing this puts you at risk for problems and can make the medicine less effective.  Ask your health care provider about side effects or reactions to medicines that you should watch for. Contact a health care provider if you:  Think you are having a reaction to a medicine you are taking.  Have headaches that keep coming back (recurring).  Feel dizzy.  Have swelling in your ankles.  Have trouble with your vision. Get help right away if you:  Develop a severe headache or confusion.  Have unusual weakness or numbness.  Feel faint.  Have severe pain in your chest or abdomen.  Vomit repeatedly.  Have trouble breathing. Summary  Hypertension is when the force of blood pumping through your arteries is too strong. If this condition is not controlled, it may put you at risk for serious complications.  Your personal target blood pressure may vary depending on your medical conditions, your age, and other factors. For most people, a normal blood pressure is less than 120/80.  Hypertension is treated with lifestyle changes, medicines, or a combination of both. Lifestyle changes include losing weight, eating a healthy, low-sodium diet, exercising more, and limiting alcohol. This information is not intended to replace advice given to you by your health care provider. Make sure you discuss any questions you have with your health care provider. Document Revised: 11/20/2017 Document Reviewed: 11/20/2017 [employer]vier Patient Education  2021 [employer].

## 2020-06-01 NOTE — Progress Notes (Signed)
Emily Downs 40 y.o.   Chief Complaint  Patient presents with  . Dizziness    Per patient off and on for 2 weeks with mild headache the last 3 days  . Knee Pain    Per patient both knees for 1 month    HISTORY OF PRESENT ILLNESS: This is a 40 y.o. female with history of hypertension complaining of on and off dizziness for the past 2 weeks. Takes metoprolol succinate and amlodipine daily but lately has missed several doses and has not been taking them daily. Also complaining of bilateral knee pains for a couple years. No other complaints or medical concerns today.  HPI   Prior to Admission medications   Medication Sig Start Date End Date Taking? Authorizing Provider  amLODipine (NORVASC) 10 MG tablet Take 10 mg by mouth daily. 09/26/19  Yes [provider]  cetirizine (ZYRTEC) 10 MG tablet Take 10 mg by mouth daily.   Yes [provider]  metoprolol succinate (TOPROL-XL) 100 MG 24 hr tablet Take 100 mg by mouth daily. Take with or immediately following a meal.   Yes [provider]  Prenatal Vit-Fe Fumarate-FA (PRENATAL PLUS/IRON) 27-1 MG TABS Take 1 tablet by mouth daily.    Yes [provider]    No Known Allergies  Patient Active Problem List   Diagnosis Date Noted  . S/P cesarean section 07/26/2019  . Benign essential hypertension antepartum in third trimester 07/23/2019  . Near syncope 06/27/2019  . Dehydration during pregnancy 06/27/2019  . Essential hypertension, benign 12/11/2013  . Menometrorrhagia   . Iron deficiency anemia     Past Medical History:  Diagnosis Date  . Allergy   . Beta thalassemia trait   . Beta thalassemia trait   . Genital herpes   . Gestational diabetes   . HPV (human papilloma virus) anogenital infection   . Hx of chlamydia infection   . Hx of pre-eclampsia in prior pregnancy, currently pregnant   . Hypertension   . Iron deficiency anemia   . Menometrorrhagia     Past Surgical History:   Procedure Laterality Date  . CESAREAN SECTION N/A 07/23/2019   Procedure: CESAREAN SECTION;  Surgeon: Jerelyn Charles, MD;  Location: [facility];  Service: Obstetrics;  Laterality: N/A;  . COLPOSCOPY    . DILATION AND EVACUATION  2009  . DILATION AND EVACUATION N/A 11/08/2015   Procedure: DILATATION AND EVACUATION WITH CHROMASOME STUDIES;  Surgeon: Servando Salina, MD;  Location: [facility];  Service: Gynecology;  Laterality: N/A;    Social History   Socioeconomic History  . Marital status: Married    Spouse name: Latise Dilley  . Number of children: 1  . Years of education: MBA  . Highest education level: Not on file  Occupational History  . Occupation: Probation officer  . Occupation: residence Charity fundraiser    Comment: [employer] (04/23/2016)  Tobacco Use  . Smoking status: Never Smoker  . Smokeless tobacco: Never Used  Vaping Use  . Vaping Use: Never used  Substance and Sexual Activity  . Alcohol use: No  . Drug use: No  . Sexual activity: Not Currently    Birth control/protection: None  Other Topics Concern  . Not on file  Social History Narrative   Marital status: married x 7 years      Children: 70 yo daughter.      Lives: with husband, daughter.      Employment:  Theme park manager, [employer] Director  Tobacco; none      Alcohol: rare to none in 2018      Drugs:  None      Exercise: none in 2018      Seatbelt: 100%   Social Determinants of Health   Financial Resource Strain: Not on file  Food Insecurity: Not on file  Transportation Needs: Not on file  Physical Activity: Not on file  Stress: Not on file  Social Connections: Not on file  Intimate Partner Violence: Not on file    Family History  Problem Relation Age of Onset  . Hypertension Mother   . Diabetes Mother   . Aneurysm Mother 75       brain aneurysm  . Sarcoidosis Mother   . Hyperlipidemia Father   . Diabetes Maternal Grandmother   . Stroke Maternal Grandmother   . Cancer Paternal Grandmother   .  Crohn's disease Sister   . Hypertension Sister      Review of Systems  Constitutional: Negative.  Negative for chills and fever.  HENT: Negative.  Negative for congestion and sore throat.   Respiratory: Negative.  Negative for cough and shortness of breath.   Cardiovascular: Negative.  Negative for chest pain and palpitations.  Gastrointestinal: Negative.  Negative for abdominal pain, blood in stool, diarrhea, melena, nausea and vomiting.  Genitourinary: Negative.  Negative for dysuria and hematuria.  Musculoskeletal: Positive for joint pain (Both knees).  Skin: Negative.  Negative for rash.  Neurological: Positive for dizziness and headaches. Negative for sensory change and focal weakness.  All other systems reviewed and are negative.   Today's Vitals   06/01/20 1341  BP: 116/75  Pulse: 71  Resp: 16  Temp: 97.9 F (36.6 C)  TempSrc: Temporal  SpO2: 99%  Weight: 161 lb (73 kg)  Height: 5' (1.524 m)   Body mass index is 31.44 kg/m. Wt Readings from Last 3 Encounters:  06/01/20 161 lb (73 kg)  12/21/19 169 lb 3.2 oz (76.7 kg)  09/21/19 163 lb (73.9 kg)    Physical Exam Vitals reviewed.  Constitutional:      Appearance: Normal appearance.  HENT:     Head: Normocephalic.     Mouth/Throat:     Mouth: Mucous membranes are moist.     Pharynx: Oropharynx is clear.  Eyes:     Extraocular Movements: Extraocular movements intact.     Conjunctiva/sclera: Conjunctivae normal.     Pupils: Pupils are equal, round, and reactive to light.  Neck:     Vascular: No carotid bruit.  Cardiovascular:     Rate and Rhythm: Normal rate and regular rhythm.     Pulses: Normal pulses.     Heart sounds: Normal heart sounds.  Pulmonary:     Effort: Pulmonary effort is normal.     Breath sounds: Normal breath sounds.  Musculoskeletal:        General: Normal range of motion.     Cervical back: Normal range of motion and neck supple. No tenderness.  Lymphadenopathy:     Cervical: No  cervical adenopathy.  Skin:    General: Skin is warm and dry.     Capillary Refill: Capillary refill takes less than 2 seconds.  Neurological:     General: No focal deficit present.     Mental Status: She is alert and oriented to person, place, and time.     Cranial Nerves: No cranial nerve deficit.     Sensory: No sensory deficit.     Motor: No weakness.  Coordination: Coordination normal.     Gait: Gait normal.  Psychiatric:        Mood and Affect: Mood normal.        Behavior: Behavior normal.     A total of 30 minutes was spent with the patient, greater than 50% of which was in counseling/coordination of care regarding differential diagnosis of dizziness including possibility of hypertension related problem, review of all medications and changes made, need to monitor blood pressure readings at home daily for the next couple of weeks, need for blood work, differential diagnosis of bilateral chronic knee pain and need for orthopedic evaluation, prognosis, documentation and need for follow-up.  ASSESSMENT & PLAN: Emily Downs was seen today for dizziness and knee pain.  Diagnoses and all orders for this visit:  Essential hypertension, benign -     CMP14+EGFR; Future  Chronic pain of both knees -     DG Knee Complete 4 Views Left; Future -     DG Knee Complete 4 Views Right; Future -     Cancel: Sedimentation Rate -     Ambulatory referral to Orthopedic Surgery -     CBC with Differential/Platelet; Future -     Sedimentation Rate; Future  Dizziness -     Cancel: CBC with Differential/Platelet -     Cancel: Comprehensive metabolic panel -     Cancel: Hemoglobin A1c -     CBC with Differential/Platelet; Future  Screening for diabetes mellitus -     Hemoglobin A1c; Future    Patient Instructions   Decrease dose of amlodipine to 5 mg daily. Continue metoprolol succinate 100 mg daily. Monitor blood pressure readings at home several times a day for the next couple weeks and  keep a log.  Follow-up in 3 months but earlier as needed.     If you have lab work done today you will be contacted with your lab results within the next 2 weeks.  If you have not heard from [place] then please contact us. The fastest way to get your results is to register for My Chart.   IF you received an x-ray today, you will receive an invoice from [facility]. Please contact [facility] at [phone] with questions or concerns regarding your invoice.   IF you received labwork today, you will receive an invoice from [facility]. Please contact [facility] at [phone] with questions or concerns regarding your invoice.   Our billing staff will not be able to assist you with questions regarding bills from these companies.  You will be contacted with the lab results as soon as they are available. The fastest way to get your results is to activate your My Chart account. Instructions are located on the last page of this paperwork. If you have not heard from [place] regarding the results in 2 weeks, please contact this office.     Hypertension, Adult High blood pressure (hypertension) is when the force of blood pumping through the arteries is too strong. The arteries are the blood vessels that carry blood from the heart throughout the body. Hypertension forces the heart to work harder to pump blood and may cause arteries to become narrow or stiff. Untreated or uncontrolled hypertension can cause a heart attack, heart failure, a stroke, kidney disease, and other problems. A blood pressure reading consists of a higher number over a lower number. Ideally, your blood pressure should be below 120/80. The first ("top") number is called the systolic pressure. It is a measure of the  pressure in your arteries as your heart beats. The second ("bottom") number is called the diastolic pressure. It is a measure of the pressure in your arteries as the heart relaxes. What are the causes? The exact cause  of this condition is not known. There are some conditions that result in or are related to high blood pressure. What increases the risk? Some risk factors for high blood pressure are under your control. The following factors may make you more likely to develop this condition:  Smoking.  Having type 2 diabetes mellitus, high cholesterol, or both.  Not getting enough exercise or physical activity.  Being overweight.  Having too much fat, sugar, calories, or salt (sodium) in your diet.  Drinking too much alcohol. Some risk factors for high blood pressure may be difficult or impossible to change. Some of these factors include:  Having chronic kidney disease.  Having a family history of high blood pressure.  Age. Risk increases with age.  Race. You may be at higher risk if you are African American.  Gender. Men are at higher risk than women before age 29. After age 84, women are at higher risk than men.  Having obstructive sleep apnea.  Stress. What are the signs or symptoms? High blood pressure may not cause symptoms. Very high blood pressure (hypertensive crisis) may cause:  Headache.  Anxiety.  Shortness of breath.  Nosebleed.  Nausea and vomiting.  Vision changes.  Severe chest pain.  Seizures. How is this diagnosed? This condition is diagnosed by measuring your blood pressure while you are seated, with your arm resting on a flat surface, your legs uncrossed, and your feet flat on the floor. The cuff of the blood pressure monitor will be placed directly against the skin of your upper arm at the level of your heart. It should be measured at least twice using the same arm. Certain conditions can cause a difference in blood pressure between your right and left arms. Certain factors can cause blood pressure readings to be lower or higher than normal for a short period of time:  When your blood pressure is higher when you are in a health care provider's office than when  you are at home, this is called white coat hypertension. Most people with this condition do not need medicines.  When your blood pressure is higher at home than when you are in a health care provider's office, this is called masked hypertension. Most people with this condition may need medicines to control blood pressure. If you have a high blood pressure reading during one visit or you have normal blood pressure with other risk factors, you may be asked to:  Return on a different day to have your blood pressure checked again.  Monitor your blood pressure at home for 1 week or longer. If you are diagnosed with hypertension, you may have other blood or imaging tests to help your health care provider understand your overall risk for other conditions. How is this treated? This condition is treated by making healthy lifestyle changes, such as eating healthy foods, exercising more, and reducing your alcohol intake. Your health care provider may prescribe medicine if lifestyle changes are not enough to get your blood pressure under control, and if:  Your systolic blood pressure is above 130.  Your diastolic blood pressure is above 80. Your personal target blood pressure may vary depending on your medical conditions, your age, and other factors. Follow these instructions at home: Eating and drinking  Eat a  diet that is high in fiber and potassium, and low in sodium, added sugar, and fat. An example eating plan is called the DASH (Dietary Approaches to Stop Hypertension) diet. To eat this way: ? Eat plenty of fresh fruits and vegetables. Try to fill one half of your plate at each meal with fruits and vegetables. ? Eat whole grains, such as whole-wheat pasta, brown rice, or whole-grain bread. Fill about one fourth of your plate with whole grains. ? Eat or drink low-fat dairy products, such as skim milk or low-fat yogurt. ? Avoid fatty cuts of meat, processed or cured meats, and poultry with skin. Fill  about one fourth of your plate with lean proteins, such as fish, chicken without skin, beans, eggs, or tofu. ? Avoid pre-made and processed foods. These tend to be higher in sodium, added sugar, and fat.  Reduce your daily sodium intake. Most people with hypertension should eat less than 1,500 mg of sodium a day.  Do not drink alcohol if: ? Your health care provider tells you not to drink. ? You are pregnant, may be pregnant, or are planning to become pregnant.  If you drink alcohol: ? Limit how much you use to:  0-1 drink a day for women.  0-2 drinks a day for men. ? Be aware of how much alcohol is in your drink. In the [place] one drink equals one 12 oz bottle of beer (355 mL), one 5 oz glass of wine (148 mL), or one 1 oz glass of hard liquor (44 mL).   Lifestyle  Work with your health care provider to maintain a healthy body weight or to lose weight. Ask what an ideal weight is for you.  Get at least 30 minutes of exercise most days of the week. Activities may include walking, swimming, or biking.  Include exercise to strengthen your muscles (resistance exercise), such as Pilates or lifting weights, as part of your weekly exercise routine. Try to do these types of exercises for 30 minutes at least 3 days a week.  Do not use any products that contain nicotine or tobacco, such as cigarettes, e-cigarettes, and chewing tobacco. If you need help quitting, ask your health care provider.  Monitor your blood pressure at home as told by your health care provider.  Keep all follow-up visits as told by your health care provider. This is important.   Medicines  Take over-the-counter and prescription medicines only as told by your health care provider. Follow directions carefully. Blood pressure medicines must be taken as prescribed.  Do not skip doses of blood pressure medicine. Doing this puts you at risk for problems and can make the medicine less effective.  Ask your health care provider  about side effects or reactions to medicines that you should watch for. Contact a health care provider if you:  Think you are having a reaction to a medicine you are taking.  Have headaches that keep coming back (recurring).  Feel dizzy.  Have swelling in your ankles.  Have trouble with your vision. Get help right away if you:  Develop a severe headache or confusion.  Have unusual weakness or numbness.  Feel faint.  Have severe pain in your chest or abdomen.  Vomit repeatedly.  Have trouble breathing. Summary  Hypertension is when the force of blood pumping through your arteries is too strong. If this condition is not controlled, it may put you at risk for serious complications.  Your personal target blood pressure may vary depending on  your medical conditions, your age, and other factors. For most people, a normal blood pressure is less than 120/80.  Hypertension is treated with lifestyle changes, medicines, or a combination of both. Lifestyle changes include losing weight, eating a healthy, low-sodium diet, exercising more, and limiting alcohol. This information is not intended to replace advice given to you by your health care provider. Make sure you discuss any questions you have with your health care provider. Document Revised: 11/20/2017 Document Reviewed: 11/20/2017 Elsevier Patient Education  2021 Elsevier Inc.      Agustina Caroli, MD [facility]

## 2020-06-02 ENCOUNTER — Ambulatory Visit: Payer: BC Managed Care – PPO

## 2020-06-02 DIAGNOSIS — M25562 Pain in left knee: Secondary | ICD-10-CM | POA: Diagnosis not present

## 2020-06-02 DIAGNOSIS — I10 Essential (primary) hypertension: Secondary | ICD-10-CM

## 2020-06-02 DIAGNOSIS — Z131 Encounter for screening for diabetes mellitus: Secondary | ICD-10-CM

## 2020-06-02 DIAGNOSIS — M25561 Pain in right knee: Secondary | ICD-10-CM | POA: Diagnosis not present

## 2020-06-02 DIAGNOSIS — G8929 Other chronic pain: Secondary | ICD-10-CM

## 2020-06-02 DIAGNOSIS — R42 Dizziness and giddiness: Secondary | ICD-10-CM | POA: Diagnosis not present

## 2020-06-03 ENCOUNTER — Encounter (INDEPENDENT_AMBULATORY_CARE_PROVIDER_SITE_OTHER): Payer: Self-pay

## 2020-06-03 LAB — CMP14+EGFR
ALT: 21 IU/L (ref 0–32)
AST: 25 IU/L (ref 0–40)
Albumin/Globulin Ratio: 1.5 (ref 1.2–2.2)
Albumin: 4.1 g/dL (ref 3.8–4.8)
Alkaline Phosphatase: 113 IU/L (ref 44–121)
BUN/Creatinine Ratio: 15 (ref 9–23)
BUN: 13 mg/dL (ref 6–24)
Bilirubin Total: <0.2 mg/dL (ref 0.0–1.2)
CO2: 24 mmol/L (ref 20–29)
Calcium: 8.7 mg/dL (ref 8.7–10.2)
Chloride: 101 mmol/L (ref 96–106)
Creatinine, Ser: 0.85 mg/dL (ref 0.57–1.00)
Globulin, Total: 2.8 g/dL (ref 1.5–4.5)
Glucose: 84 mg/dL (ref 65–99)
Potassium: 3.8 mmol/L (ref 3.5–5.2)
Sodium: 140 mmol/L (ref 134–144)
Total Protein: 6.9 g/dL (ref 6.0–8.5)
eGFR: 88 mL/min/{1.73_m2} (ref >59)

## 2020-06-03 LAB — CBC WITH DIFFERENTIAL/PLATELET
Basophils Absolute: 0 10*3/uL (ref 0.0–0.2)
Basos: 0 %
EOS (ABSOLUTE): 0.1 10*3/uL (ref 0.0–0.4)
Eos: 2 %
Hematocrit: 39.9 % (ref 34.0–46.6)
Hemoglobin: 12.7 g/dL (ref 11.1–15.9)
Immature Grans (Abs): 0 10*3/uL (ref 0.0–0.1)
Immature Granulocytes: 0 %
Lymphocytes Absolute: 2.2 10*3/uL (ref 0.7–3.1)
Lymphs: 33 %
MCH: 22.9 pg — ABNORMAL LOW (ref 26.6–33.0)
MCHC: 31.8 g/dL (ref 31.5–35.7)
MCV: 72 fL — ABNORMAL LOW (ref 79–97)
Monocytes Absolute: 0.5 10*3/uL (ref 0.1–0.9)
Monocytes: 7 %
Neutrophils Absolute: 4 10*3/uL (ref 1.4–7.0)
Neutrophils: 58 %
Platelets: 351 10*3/uL (ref 150–450)
RBC: 5.54 x10E6/uL — ABNORMAL HIGH (ref 3.77–5.28)
RDW: 16.6 % — ABNORMAL HIGH (ref 11.7–15.4)
WBC: 6.9 10*3/uL (ref 3.4–10.8)

## 2020-06-03 LAB — HEMOGLOBIN A1C
Est. average glucose Bld gHb Est-mCnc: 120 mg/dL
Hgb A1c MFr Bld: 5.8 % — ABNORMAL HIGH (ref 4.8–5.6)

## 2020-06-03 LAB — SEDIMENTATION RATE: Sed Rate: 13 mm/hr (ref 0–32)

## 2020-06-07 ENCOUNTER — Ambulatory Visit (INDEPENDENT_AMBULATORY_CARE_PROVIDER_SITE_OTHER): Payer: BC Managed Care – PPO | Admitting: Orthopaedic Surgery

## 2020-06-07 ENCOUNTER — Other Ambulatory Visit: Payer: Self-pay

## 2020-06-07 ENCOUNTER — Encounter: Payer: Self-pay | Admitting: Physician Assistant

## 2020-06-07 DIAGNOSIS — M25562 Pain in left knee: Secondary | ICD-10-CM | POA: Diagnosis not present

## 2020-06-07 DIAGNOSIS — M25561 Pain in right knee: Secondary | ICD-10-CM | POA: Diagnosis not present

## 2020-06-07 DIAGNOSIS — G8929 Other chronic pain: Secondary | ICD-10-CM | POA: Diagnosis not present

## 2020-06-07 MED ORDER — DICLOFENAC SODIUM 75 MG PO TBEC: 75.0000 mg | DELAYED_RELEASE_TABLET | Freq: Two times a day (BID) | ORAL | 2 refills | Status: DC | PRN

## 2020-06-07 NOTE — Progress Notes (Signed)
Office Visit Note   Patient: Emily Downs           Date of Birth: 1981/03/11           MRN: [id number] Visit Date: 06/07/2020              Requested by: Horald Pollen, MD [name],  [place] [address] PCP: Horald Pollen, MD   Assessment & Plan: Visit Diagnoses:  1. Chronic pain of both knees     Plan: Impression is bilateral knee patellofemoral osteoarthritis.  Today, we discussed a course of anti-inflammatories versus cortisone injections in addition to PSO bracing as well as a visit to physical therapy for them layout home exercise program to help with quad strengthening exercises.  She would like to hold off on the injections for now and try the prescription NSAIDs.  Should her symptoms worsen she will come back in for injections.  Have also sent in a referral for physical therapy.  Call with concerns or questions in the meantime.  Follow-Up Instructions: Return if symptoms worsen or fail to improve.   Orders:  Orders Placed This Encounter  Procedures  . Ambulatory referral to Physical Therapy   Meds ordered this encounter  Medications  . diclofenac (VOLTAREN) 75 MG EC tablet    Sig: Take 1 tablet (75 mg total) by mouth 2 (two) times daily as needed.    Dispense:  60 tablet    Refill:  2      Procedures: No procedures performed   Clinical Data: No additional findings.   Subjective: Chief Complaint  Patient presents with  . Right Knee - Pain  . Left Knee - Pain    HPI patient is a pleasant 40 year old female who comes in today with bilateral knee pain right greater than left.  This is been intermittent for the past several years and typically flares up when she is working out.  She denies any specific injury.  She notes that she has a baby as well as a 45-year-old and is recently having to chase them both around the house going up and down stairs.  Stair climbing as well as squats and sitting with her legs crossed seem to aggravate  her symptoms most.  She has not taken any medication for this.  No previous injection to either knee.  Review of Systems as detailed in HPI.  All others reviewed and are negative.   Objective: Vital Signs: There were no vitals taken for this visit.  Physical Exam well-developed well-nourished gentleman in no acute distress.  Alert and oriented x3.  Ortho Exam bilateral knee exam shows no effusion.  She does have an increased Q angle.  Range of motion 0 to 120 degrees.  No joint line tenderness on the left.  She does have tenderness along the lateral patella facet.  Right knee exam shows lateral patella facet tenderness.  Mild lateral joint line tenderness.  Moderate patellofemoral crepitus both sides.  Ligaments are stable.  She is neurovascular intact distally.  Specialty Comments:  No specialty comments available.  Imaging: No new imaging  PMFS History: Patient Active Problem List   Diagnosis Date Noted  . S/P cesarean section 07/26/2019  . Benign essential hypertension antepartum in third trimester 07/23/2019  . Near syncope 06/27/2019  . Dehydration during pregnancy 06/27/2019  . Essential hypertension, benign 12/11/2013  . Menometrorrhagia   . Iron deficiency anemia    Past Medical History:  Diagnosis Date  . Allergy   .  Beta thalassemia trait   . Beta thalassemia trait   . Genital herpes   . Gestational diabetes   . HPV (human papilloma virus) anogenital infection   . Hx of chlamydia infection   . Hx of pre-eclampsia in prior pregnancy, currently pregnant   . Hypertension   . Iron deficiency anemia   . Menometrorrhagia     Family History  Problem Relation Age of Onset  . Hypertension Mother   . Diabetes Mother   . Aneurysm Mother 67       brain aneurysm  . Sarcoidosis Mother   . Hyperlipidemia Father   . Diabetes Maternal Grandmother   . Stroke Maternal Grandmother   . Cancer Paternal Grandmother   . Crohn's disease Sister   . Hypertension Sister      Past Surgical History:  Procedure Laterality Date  . CESAREAN SECTION N/A 07/23/2019   Procedure: CESAREAN SECTION;  Surgeon: Jerelyn Charles, MD;  Location: [place] [facility];  Service: Obstetrics;  Laterality: N/A;  . COLPOSCOPY    . DILATION AND EVACUATION  2009  . DILATION AND EVACUATION N/A 11/08/2015   Procedure: DILATATION AND EVACUATION WITH CHROMASOME STUDIES;  Surgeon: Servando Salina, MD;  Location: [facility];  Service: Gynecology;  Laterality: N/A;   Social History   Occupational History  . Occupation: Probation officer  . Occupation: residence Charity fundraiser    Comment: [place] (04/23/2016)  Tobacco Use  . Smoking status: Never Smoker  . Smokeless tobacco: Never Used  Vaping Use  . Vaping Use: Never used  Substance and Sexual Activity  . Alcohol use: No  . Drug use: No  . Sexual activity: Not Currently    Birth control/protection: None

## 2020-06-21 ENCOUNTER — Ambulatory Visit: Payer: BC Managed Care – PPO | Admitting: Emergency Medicine

## 2020-06-23 ENCOUNTER — Encounter: Payer: Self-pay | Admitting: Emergency Medicine

## 2020-06-23 ENCOUNTER — Other Ambulatory Visit: Payer: Self-pay | Admitting: Emergency Medicine

## 2020-06-24 NOTE — Telephone Encounter (Signed)
Please advise I was trying to assist with it but I do not see where she last received the medication and when they should've finished.

## 2020-06-30 ENCOUNTER — Ambulatory Visit: Payer: BC Managed Care – PPO | Attending: Physician Assistant | Admitting: Physical Therapy

## 2020-06-30 ENCOUNTER — Encounter: Payer: Self-pay | Admitting: Physical Therapy

## 2020-06-30 ENCOUNTER — Other Ambulatory Visit: Payer: Self-pay

## 2020-06-30 DIAGNOSIS — R2689 Other abnormalities of gait and mobility: Secondary | ICD-10-CM | POA: Insufficient documentation

## 2020-06-30 DIAGNOSIS — M25562 Pain in left knee: Secondary | ICD-10-CM | POA: Insufficient documentation

## 2020-06-30 DIAGNOSIS — M25561 Pain in right knee: Secondary | ICD-10-CM | POA: Insufficient documentation

## 2020-06-30 DIAGNOSIS — G8929 Other chronic pain: Secondary | ICD-10-CM | POA: Diagnosis not present

## 2020-06-30 NOTE — Patient Instructions (Signed)
Access Code: [email or web address] URL: [email or web address] Date: 06/30/2020 Prepared by: Carolyne Littles  Exercises Supine Quad Set - 1 x daily - 7 x weekly - 3 sets - 10 reps Small Range Straight Leg Raise - 1 x daily - 7 x weekly - 3 sets - 10 reps Supine Bridge - 1 x daily - 7 x weekly - 3 sets - 10 reps Heel rises with counter support - 1 x daily - 7 x weekly - 3 sets - 10 reps

## 2020-06-30 NOTE — Therapy (Signed)
[place], [place], [address] Phone: [phone]   Fax:  [phone]  Physical Therapy Evaluation  Patient Details  Name: Emily Downs MRN: [id number] Date of Birth: May 09, 1980 Referring Provider (PT): Dwana Melena PA   Encounter Date: 06/30/2020   PT End of Session - 06/30/20 1450    Visit Number 1    Number of Visits 6    Date for PT Re-Evaluation 08/11/20    Authorization Type [employer]    PT Start Time 1415    PT Stop Time 1458    PT Time Calculation (min) 43 min    Activity Tolerance Patient tolerated treatment well    Behavior During Therapy [facility] for tasks assessed/performed           Past Medical History:  Diagnosis Date  . Allergy   . Beta thalassemia trait   . Beta thalassemia trait   . Genital herpes   . Gestational diabetes   . HPV (human papilloma virus) anogenital infection   . Hx of chlamydia infection   . Hx of pre-eclampsia in prior pregnancy, currently pregnant   . Hypertension   . Iron deficiency anemia   . Menometrorrhagia     Past Surgical History:  Procedure Laterality Date  . CESAREAN SECTION N/A 07/23/2019   Procedure: CESAREAN SECTION;  Surgeon: Jerelyn Charles, MD;  Location: [facility];  Service: Obstetrics;  Laterality: N/A;  . COLPOSCOPY    . DILATION AND EVACUATION  2009  . DILATION AND EVACUATION N/A 11/08/2015   Procedure: DILATATION AND EVACUATION WITH CHROMASOME STUDIES;  Surgeon: Servando Salina, MD;  Location: [facility];  Service: Gynecology;  Laterality: N/A;    There were no vitals filed for this visit.    Subjective Assessment - 06/30/20 1419    Subjective Patient has had progressive knee pain over the years. Over the past few years the pain has become progressively worse. Her pain is worse on the right. She has pain going up and down steps.    How long can you sit comfortably? can get stiff when sitting    How long can you stand comfortably?  standing still isn't too bad    How long can you walk comfortably? pain walking up hill and up steps    Currently in Pain? Yes    Pain Score 7     Pain Orientation Right    Pain Descriptors / Indicators Sharp    Pain Type Chronic pain    Pain Onset More than a month ago    Pain Frequency Constant    Aggravating Factors  standing, walking, and steps    Pain Relieving Factors rest    Effect of Pain on Daily Activities difficulty standing and walking for periods of time    Multiple Pain Sites Yes    Pain Score 5    Pain Location Ankle    Pain Orientation Right    Pain Descriptors / Indicators Sharp    Pain Onset More than a month ago    Pain Frequency Constant    Aggravating Factors  standing and walking    Pain Relieving Factors rest    Effect of Pain on Daily Activities difficulty perfroming ADL's              [facility] PT Assessment - 06/30/20 0001      Assessment   Medical Diagnosis Bilateral Knee Pa    Referring Provider (PT) Dwana Melena PA  Onset Date/Surgical Date --   Several years   Hand Dominance Right    Next MD Visit Nothing scheduled    Prior Therapy None      Precautions   Precautions None      Restrictions   Weight Bearing Restrictions No      Balance Screen   Has the patient fallen in the past 6 months No    Has the patient had a decrease in activity level because of a fear of falling?  No    Is the patient reluctant to leave their home because of a fear of falling?  No      Home Environment   Living Environment Private residence    Additional Comments has a second floor. Painful going up the steps      Prior Function   Level of Independence Independent    Vocation Part time employment    Vocation Requirements Hair stylist    Leisure would like to go to the gym      Cognition   Overall Cognitive Status Within Functional Limits for tasks assessed    Attention Focused    Focused Attention Appears intact    Memory Appears intact    Awareness  Appears intact    Problem Solving Appears intact      Observation/Other Assessments   Focus on Therapeutic Outcomes (FOTO)  mediciad      Sensation   Light Touch Appears Intact    Additional Comments carpel tunnel in the hands      Coordination   Gross Motor Movements are Fluid and Coordinated Yes    Fine Motor Movements are Fluid and Coordinated Yes      ROM / Strength   AROM / PROM / Strength AROM;PROM;Strength      AROM   Overall AROM Comments no pain with active flexion; crepitus noted      PROM   Overall PROM Comments no pain with full PROM of both knees      Strength   Strength Assessment Site Hip;Knee    Right/Left Hip Right;Left    Right Hip Flexion 4/5    Right Hip ABduction 5/5    Right Hip ADduction 5/5    Left Hip Flexion 4+/5    Left Hip ABduction 5/5    Left Hip ADduction 5/5      Palpation   Palpation comment no unexpected tenderness to palpation      Ambulation/Gait   Gait Comments bilateral vlagus                      Objective measurements completed on examination: See above findings.       [facility] PT Treatment/Exercise - 06/30/20 0001      Knee/Hip Exercises: Standing   Heel Raises Limitations x20      Knee/Hip Exercises: Supine   Quad Sets Limitations x10 5 sec hold    Bridges Limitations with band 2x10    Straight Leg Raises Limitations 2x10 bilateral      Manual Therapy   Manual therapy comments taped right patella laterally                  PT Education - 06/30/20 1652    Education Details reviewed HEP and symptom mangement; progression of activity    Person(s) Educated Patient    Methods Explanation;Demonstration;Tactile cues;Verbal cues    Comprehension Returned demonstration;Verbalized understanding;Verbal cues required;Tactile cues required  PT Long Term Goals - 06/30/20 1646      PT LONG TERM GOAL #1   Title Patient will be indepcnent with complete HEP    Baseline does not  have HEP    Time 6    Period Weeks    Status New    Target Date 08/11/20      PT LONG TERM GOAL #2   Title Patient will increase gross bilateral LE strength to 5/5 in order to perfrom daily tasks    Baseline 4+/5 bilateral hip flexion strength    Time 6    Period Weeks    Status New    Target Date 08/11/20      PT LONG TERM GOAL #3   Title Patient will go up /down 12 steps without pain    Time 6    Period Weeks    Status New    Target Date 08/11/20                  Plan - 06/30/20 1639    Clinical Impression Statement Patient is a 40 year old female with bilateral knee pain R>L. She has a long histroy of knee pain. She has creitus with knee extension R>L. She has minor strength defecits in hip fleixon. She has decreased single leg stance on the right. She has pain going up and down steps. She has signifcant crepitus with squats. She would benefit from skilled therapy to work on an HEP. She was taped today for lateral patella releif. She would benefit from further skilled therapy but notified therapy as she was leaving the clinic that she only planned on coming for 1 visit. She may see how much her insurance covers adn come back for 1 follow up. Otherwise we will D/C to limited HEP. We would have given her more to progress too if it was clear that it was a 1x visit.    Examination-Activity Limitations Dressing;Stairs;Squat;Sit;Locomotion Level;Transfers    Examination-Participation Restrictions Cleaning;Community Activity;Laundry;Shop;Occupation    Stability/Clinical Decision Making Evolving/Moderate complexity   decreasing functional mobility   Clinical Decision Making Low    Rehab Potential Good    PT Frequency 1x / week   patient requests a 1x visit but may come back for follow up   PT Duration 6 weeks    PT Treatment/Interventions ADLs/Self Care Home Management;Cryotherapy;Electrical Stimulation;Iontophoresis 4mg /ml Dexamethasone;Ultrasound;Neuromuscular  re-education;Patient/family education;Manual techniques;Passive range of motion;Taping    PT Next Visit Plan review taping; progress to standing activity if she comes back consider proegressing to lateral band walk; single leg stance; soingle leg reach; treview squat technique    PT Home Exercise Plan Access Code: [email or web address]  URL: [email or web address]  Date: 06/30/2020  Prepared by: Carolyne Littles    Exercises  Supine Quad Set - 1 x daily - 7 x weekly - 3 sets - 10 reps  Small Range Straight Leg Raise - 1 x daily - 7 x weekly - 3 sets - 10 reps  Supine Bridge - 1 x daily - 7 x weekly - 3 sets - 10 reps  Heel rises with counter support - 1 x daily - 7 x weekly - 3 sets - 10 reps    Consulted and Agree with Plan of Care Patient           Patient will benefit from skilled therapeutic intervention in order to improve the following deficits and impairments:  Abnormal gait,Decreased activity tolerance,Decreased endurance,Decreased mobility,Decreased strength,Pain,Difficulty walking  Visit Diagnosis: Chronic pain of left knee  Chronic pain of right knee  Other abnormalities of gait and mobility     Problem List Patient Active Problem List   Diagnosis Date Noted  . S/P cesarean section 07/26/2019  . Benign essential hypertension antepartum in third trimester 07/23/2019  . Near syncope 06/27/2019  . Dehydration during pregnancy 06/27/2019  . Essential hypertension, benign 12/11/2013  . Menometrorrhagia   . Iron deficiency anemia     Carney Living PT DPT  06/30/2020, 4:53 PM  [facility] [address] [place], [place], [address] Phone: [phone]   Fax:  [phone]  Name: Emily Downs MRN: [id number] Date of Birth: [birth year]

## 2020-07-22 ENCOUNTER — Ambulatory Visit: Payer: BC Managed Care – PPO

## 2020-07-29 ENCOUNTER — Ambulatory Visit: Payer: BC Managed Care – PPO

## 2020-08-30 DIAGNOSIS — Z6831 Body mass index (BMI) 31.0-31.9, adult: Secondary | ICD-10-CM | POA: Diagnosis not present

## 2020-08-30 DIAGNOSIS — Z01419 Encounter for gynecological examination (general) (routine) without abnormal findings: Secondary | ICD-10-CM | POA: Diagnosis not present

## 2020-08-30 DIAGNOSIS — Z124 Encounter for screening for malignant neoplasm of cervix: Secondary | ICD-10-CM | POA: Diagnosis not present

## 2020-08-30 DIAGNOSIS — R87810 Cervical high risk human papillomavirus (HPV) DNA test positive: Secondary | ICD-10-CM | POA: Diagnosis not present

## 2020-12-02 ENCOUNTER — Other Ambulatory Visit: Payer: Self-pay | Admitting: Emergency Medicine

## 2021-01-06 ENCOUNTER — Emergency Department (HOSPITAL_COMMUNITY)
Admission: EM | Admit: 2021-01-06 | Discharge: 2021-01-07 | Disposition: A | Discharge status: Home / Self Care | Payer: BC Managed Care – PPO | Source: Home / Self Care | Attending: Emergency Medicine | Admitting: Emergency Medicine

## 2021-01-06 ENCOUNTER — Emergency Department (HOSPITAL_COMMUNITY)
Admission: EM | Admit: 2021-01-06 | Discharge: 2021-01-06 | Disposition: A | Discharge status: Home / Self Care | Payer: BC Managed Care – PPO | Attending: Emergency Medicine | Admitting: Emergency Medicine

## 2021-01-06 ENCOUNTER — Encounter (HOSPITAL_COMMUNITY): Payer: Self-pay | Admitting: *Deleted

## 2021-01-06 ENCOUNTER — Other Ambulatory Visit: Payer: Self-pay

## 2021-01-06 ENCOUNTER — Encounter (HOSPITAL_COMMUNITY): Payer: Self-pay

## 2021-01-06 ENCOUNTER — Ambulatory Visit (HOSPITAL_COMMUNITY)
Admission: EM | Admit: 2021-01-06 | Discharge: 2021-01-06 | Disposition: A | Discharge status: Home / Self Care | Payer: BC Managed Care – PPO | Attending: Urgent Care | Admitting: Urgent Care

## 2021-01-06 DIAGNOSIS — Z79899 Other long term (current) drug therapy: Secondary | ICD-10-CM | POA: Insufficient documentation

## 2021-01-06 DIAGNOSIS — I10 Essential (primary) hypertension: Secondary | ICD-10-CM

## 2021-01-06 DIAGNOSIS — D72819 Decreased white blood cell count, unspecified: Secondary | ICD-10-CM | POA: Diagnosis not present

## 2021-01-06 DIAGNOSIS — R7989 Other specified abnormal findings of blood chemistry: Secondary | ICD-10-CM | POA: Insufficient documentation

## 2021-01-06 DIAGNOSIS — R040 Epistaxis: Secondary | ICD-10-CM | POA: Insufficient documentation

## 2021-01-06 DIAGNOSIS — R519 Headache, unspecified: Secondary | ICD-10-CM | POA: Insufficient documentation

## 2021-01-06 LAB — I-STAT CHEM 8, ED
BUN: 14 mg/dL (ref 6–20)
Calcium, Ion: 1.14 mmol/L — ABNORMAL LOW (ref 1.15–1.40)
Chloride: 106 mmol/L (ref 98–111)
Creatinine, Ser: 0.8 mg/dL (ref 0.44–1.00)
Glucose, Bld: 99 mg/dL (ref 70–99)
HCT: 39 % (ref 36.0–46.0)
Hemoglobin: 13.3 g/dL (ref 12.0–15.0)
Potassium: 3.9 mmol/L (ref 3.5–5.1)
Sodium: 141 mmol/L (ref 135–145)
TCO2: 28 mmol/L (ref 22–32)

## 2021-01-06 LAB — COMPREHENSIVE METABOLIC PANEL
ALT: 20 U/L (ref 0–44)
AST: 25 U/L (ref 15–41)
Albumin: 3.4 g/dL — ABNORMAL LOW (ref 3.5–5.0)
Alkaline Phosphatase: 75 U/L (ref 38–126)
Anion gap: 7 (ref 5–15)
BUN: 12 mg/dL (ref 6–20)
CO2: 26 mmol/L (ref 22–32)
Calcium: 8.6 mg/dL — ABNORMAL LOW (ref 8.9–10.3)
Chloride: 105 mmol/L (ref 98–111)
Creatinine, Ser: 0.88 mg/dL (ref 0.44–1.00)
GFR, Estimated: >60 mL/min (ref >60)
Glucose, Bld: 107 mg/dL — ABNORMAL HIGH (ref 70–99)
Potassium: 3.7 mmol/L (ref 3.5–5.1)
Sodium: 138 mmol/L (ref 135–145)
Total Bilirubin: 0.4 mg/dL (ref 0.3–1.2)
Total Protein: 6.9 g/dL (ref 6.5–8.1)

## 2021-01-06 LAB — CBC WITH DIFFERENTIAL/PLATELET
Abs Immature Granulocytes: 0.01 10*3/uL (ref 0.00–0.07)
Basophils Absolute: 0 10*3/uL (ref 0.0–0.1)
Basophils Relative: 0 %
Eosinophils Absolute: 0.1 10*3/uL (ref 0.0–0.5)
Eosinophils Relative: 2 %
HCT: 37 % (ref 36.0–46.0)
Hemoglobin: 11.4 g/dL — ABNORMAL LOW (ref 12.0–15.0)
Immature Granulocytes: 0 %
Lymphocytes Relative: 40 %
Lymphs Abs: 2.1 10*3/uL (ref 0.7–4.0)
MCH: 22.4 pg — ABNORMAL LOW (ref 26.0–34.0)
MCHC: 30.8 g/dL (ref 30.0–36.0)
MCV: 72.7 fL — ABNORMAL LOW (ref 80.0–100.0)
Monocytes Absolute: 0.5 10*3/uL (ref 0.1–1.0)
Monocytes Relative: 9 %
Neutro Abs: 2.6 10*3/uL (ref 1.7–7.7)
Neutrophils Relative %: 49 %
Platelets: 393 10*3/uL (ref 150–400)
RBC: 5.09 MIL/uL (ref 3.87–5.11)
RDW: 15.9 % — ABNORMAL HIGH (ref 11.5–15.5)
WBC: 5.3 10*3/uL (ref 4.0–10.5)
nRBC: 0 % (ref 0.0–0.2)

## 2021-01-06 LAB — URINALYSIS, ROUTINE W REFLEX MICROSCOPIC
Leukocytes,Ua: NEGATIVE
Nitrite: NEGATIVE

## 2021-01-06 LAB — BRAIN NATRIURETIC PEPTIDE: B Natriuretic Peptide: 19.8 pg/mL (ref 0.0–100.0)

## 2021-01-06 LAB — URINALYSIS, MICROSCOPIC (REFLEX): RBC / HPF: >50 RBC/hpf (ref 0–5)

## 2021-01-06 LAB — POC URINE PREG, ED: Preg Test, Ur: NEGATIVE

## 2021-01-06 MED ORDER — OXYMETAZOLINE HCL 0.05 % NA SOLN
NASAL | Status: AC
  Filled 2021-01-06: qty 30

## 2021-01-06 MED ORDER — AMLODIPINE BESYLATE 5 MG PO TABS
10.0000 mg | ORAL_TABLET | Freq: Once | ORAL | Status: AC
  Administered 2021-01-06: 10 mg via ORAL
  Filled 2021-01-06: qty 2

## 2021-01-06 MED ORDER — OXYMETAZOLINE HCL 0.05 % NA SOLN: 1.0000 | Freq: Two times a day (BID) | NASAL | Status: DC

## 2021-01-06 MED ORDER — AMLODIPINE BESYLATE 10 MG PO TABS: 10.0000 mg | ORAL_TABLET | Freq: Every day | ORAL | 2 refills | Status: DC

## 2021-01-06 NOTE — Discharge Instructions (Addendum)
Please report to the emergency room as you will need a rhino rocket to help with your nose bleed and this is something we are not able to do here.

## 2021-01-06 NOTE — ED Provider Notes (Signed)
[facility] EMERGENCY DEPARTMENT Provider Note   CSN: [id number] Arrival date & time: 01/06/21  7893     History Chief Complaint  Patient presents with   Hypertension   Medication Refill    Emily Downs is a 40 y.o. female.  Patient with a known history of hypertension.  Is still taking her Toprol-XL.  But ran out of her Norvasc 10 mg once a day about a month ago.  She had onset of a right-sided nosebleed about 5 in the morning.  Which stopped and then restarted and then stopped again she is using the pinch method.  No history of nosebleeds recently.  No chest pain no shortness of breath maybe there was a slight headache.  Patient is on her menses currently.  Patient is not on any blood thinners      Past Medical History:  Diagnosis Date   Allergy    Beta thalassemia trait    Beta thalassemia trait    Genital herpes    Gestational diabetes    HPV (human papilloma virus) anogenital infection    Hx of chlamydia infection    Hx of pre-eclampsia in prior pregnancy, currently pregnant    Hypertension    Iron deficiency anemia    Menometrorrhagia     Patient Active Problem List   Diagnosis Date Noted   S/P cesarean section 07/26/2019   Benign essential hypertension antepartum in third trimester 07/23/2019   Near syncope 06/27/2019   Dehydration during pregnancy 06/27/2019   Essential hypertension, benign 12/11/2013   Menometrorrhagia    Iron deficiency anemia     Past Surgical History:  Procedure Laterality Date   CESAREAN SECTION N/A 07/23/2019   Procedure: CESAREAN SECTION;  Surgeon: Jerelyn Charles, MD;  Location: [facility];  Service: Obstetrics;  Laterality: N/A;   COLPOSCOPY     DILATION AND EVACUATION  2009   DILATION AND EVACUATION N/A 11/08/2015   Procedure: DILATATION AND EVACUATION WITH CHROMASOME STUDIES;  Surgeon: Servando Salina, MD;  Location: [facility];  Service: Gynecology;  Laterality: N/A;     OB History     Gravida  4    Para  3   Term  3   Preterm      AB  1   Living  3      SAB  1   IAB      Ectopic      Multiple  0   Live Births  3           Family History  Problem Relation Age of Onset   Hypertension Mother    Diabetes Mother    Aneurysm Mother 40       brain aneurysm   Sarcoidosis Mother    Hyperlipidemia Father    Diabetes Maternal Grandmother    Stroke Maternal Grandmother    Cancer Paternal Grandmother    Crohn's disease Sister    Hypertension Sister     Social History   Tobacco Use   Smoking status: Never   Smokeless tobacco: Never  Vaping Use   Vaping Use: Never used  Substance Use Topics   Alcohol use: No   Drug use: No    Home Medications Prior to Admission medications   Medication Sig Start Date End Date Taking? Authorizing Provider  amLODipine (NORVASC) 10 MG tablet Take 10 mg by mouth daily. 09/26/19  Yes [provider]  cetirizine (ZYRTEC) 10 MG tablet Take 10 mg by mouth daily.  Yes [provider]  ibuprofen (ADVIL) 200 MG tablet Take 800 mg by mouth every 6 (six) hours as needed for mild pain.   Yes [provider]  metoprolol succinate (TOPROL-XL) 100 MG 24 hr tablet TAKE 1 TABLET BY MOUTH EVERY DAY Patient taking differently: Take 100 mg by mouth daily. 12/03/20  Yes Sagardia, Ines Bloomer, MD  diclofenac (VOLTAREN) 75 MG EC tablet Take 1 tablet (75 mg total) by mouth 2 (two) times daily as needed. Patient not taking: No sig reported 06/07/20   Aundra Dubin, PA-C    Allergies    Patient has no known allergies.  Review of Systems   Review of Systems  Constitutional:  Negative for chills and fever.  HENT:  Positive for nosebleeds. Negative for ear pain and sore throat.   Eyes:  Negative for pain and visual disturbance.  Respiratory:  Negative for cough and shortness of breath.   Cardiovascular:  Negative for chest pain and palpitations.  Gastrointestinal:  Negative for abdominal pain and vomiting.   Genitourinary:  Negative for dysuria and hematuria.  Musculoskeletal:  Negative for arthralgias and back pain.  Skin:  Negative for color change and rash.  Neurological:  Positive for headaches. Negative for seizures and syncope.  All other systems reviewed and are negative.  Physical Exam Updated Vital Signs BP (!) 174/102   Pulse (!) 54   Temp 98.5 F (36.9 C) (Oral)   Resp 14   LMP 01/05/2021   SpO2 100%   Physical Exam Vitals and nursing note reviewed.  Constitutional:      General: She is not in acute distress.    Appearance: Normal appearance. She is well-developed.  HENT:     Head: Normocephalic and atraumatic.     Nose:     Comments: Left nares clear.  Right nares with some moist and dried blood to the anterior septal area.  No active bleeding.    Mouth/Throat:     Pharynx: Oropharynx is clear.  Eyes:     Extraocular Movements: Extraocular movements intact.     Conjunctiva/sclera: Conjunctivae normal.     Pupils: Pupils are equal, round, and reactive to light.  Cardiovascular:     Rate and Rhythm: Normal rate and regular rhythm.     Heart sounds: No murmur heard. Pulmonary:     Effort: Pulmonary effort is normal. No respiratory distress.     Breath sounds: Normal breath sounds. No wheezing, rhonchi or rales.  Abdominal:     Palpations: Abdomen is soft.     Tenderness: There is no abdominal tenderness.  Musculoskeletal:        General: Normal range of motion.     Cervical back: Neck supple.  Skin:    General: Skin is warm and dry.  Neurological:     General: No focal deficit present.     Mental Status: She is alert and oriented to person, place, and time.     Cranial Nerves: No cranial nerve deficit.     Sensory: No sensory deficit.    ED Results / Procedures / Treatments   Labs (all labs ordered are listed, but only abnormal results are displayed) Labs Reviewed  CBC WITH DIFFERENTIAL/PLATELET - Abnormal; Notable for the following components:       Result Value   Hemoglobin 11.4 (*)    MCV 72.7 (*)    MCH 22.4 (*)    RDW 15.9 (*)    All other components within normal limits  URINALYSIS, ROUTINE  W REFLEX MICROSCOPIC - Abnormal; Notable for the following components:   Color, Urine PINK (*)    APPearance TURBID (*)    Glucose, UA   (*)    Value: TEST NOT REPORTED DUE TO COLOR INTERFERENCE OF URINE PIGMENT   Hgb urine dipstick   (*)    Value: TEST NOT REPORTED DUE TO COLOR INTERFERENCE OF URINE PIGMENT   Bilirubin Urine   (*)    Value: TEST NOT REPORTED DUE TO COLOR INTERFERENCE OF URINE PIGMENT   Ketones, ur   (*)    Value: TEST NOT REPORTED DUE TO COLOR INTERFERENCE OF URINE PIGMENT   Protein, ur   (*)    Value: TEST NOT REPORTED DUE TO COLOR INTERFERENCE OF URINE PIGMENT   All other components within normal limits  COMPREHENSIVE METABOLIC PANEL - Abnormal; Notable for the following components:   Glucose, Bld 107 (*)    Calcium 8.6 (*)    Albumin 3.4 (*)    All other components within normal limits  URINALYSIS, MICROSCOPIC (REFLEX) - Abnormal; Notable for the following components:   Bacteria, UA RARE (*)    All other components within normal limits  BRAIN NATRIURETIC PEPTIDE  POC URINE PREG, ED    EKG None  Radiology No results found.  Procedures Procedures   Medications Ordered in ED Medications  amLODipine (NORVASC) tablet 10 mg (10 mg Oral Given 01/06/21 1239)    ED Course  I have reviewed the triage vital signs and the nursing notes.  Pertinent labs & imaging results that were available during my care of the patient were reviewed by me and considered in my medical decision making (see chart for details).    MDM Rules/Calculators/A&P                           Nosebleed currently under control.  Patient's urinalysis had lots of blood but she is on her menses.  Pregnancy test negative.  BNP is 19.  Complete metabolic panel without any significant abnormalities.  CBC White blood cell count 5.3.  Hemoglobin  11.4.  Platelets 393.  Patient's blood pressures are elevated here 162/98.  Patient is taking her Toprol.  Patient given her Norvasc here.  Will renew the prescription for that.  Also give her referral to ear nose and throat for follow-up on the nosebleed.  Patient understands instructions on how to deal with the nose if bleeding is recurrent   Final Clinical Impression(s) / ED Diagnoses Final diagnoses:  Primary hypertension  Right-sided nosebleed    Rx / DC Orders ED Discharge Orders     None        Fredia Sorrow, MD 01/06/21 1345

## 2021-01-06 NOTE — ED Triage Notes (Signed)
The pt is c/o a nosebleed since yesterday  no problem previously she has been here yesterday and tougg today  her nose is still bleeding   lmp yesterday

## 2021-01-06 NOTE — ED Triage Notes (Signed)
Pt reports she had a nosebleed this morning and her blood pressure was high, as she ran out of medication 1 month ago approximately. Pt reports she was seen at the ED today for same chief complaints and given meds for high blood pressure. States she started having a nosebleed this afternoon after the ED visit.

## 2021-01-06 NOTE — Discharge Instructions (Addendum)
Resume taking your Norvasc along with your Toprol-XL.  Make an appointment to follow-up with your doctor for follow-up of the blood pressure.  Make an appointment follow-up with ear nose and throat.  Nosebleed instructions provided.

## 2021-01-06 NOTE — ED Provider Notes (Signed)
Emily   MRN: [id number] DOB: 14-Dec-1980  Subjective:   Emily Downs is a 40 y.o. female presenting for epistaxis that happened after her ER visit today.  She has received her dose of amlodipine.  Has a referral pending with ENT.  She did not pick up her medication for her bp. Has mild frontal headache but no confusion, dizziness, chest pain, shortness of breath, abdominal pain, hematuria.  No current facility-administered medications for this encounter.  Current Outpatient Medications:    amLODipine (NORVASC) 10 MG tablet, Take 10 mg by mouth daily., Disp: , Rfl:    amLODipine (NORVASC) 10 MG tablet, Take 1 tablet (10 mg total) by mouth daily., Disp: 30 tablet, Rfl: 2   cetirizine (ZYRTEC) 10 MG tablet, Take 10 mg by mouth daily., Disp: , Rfl:    diclofenac (VOLTAREN) 75 MG EC tablet, Take 1 tablet (75 mg total) by mouth 2 (two) times daily as needed. (Patient not taking: No sig reported), Disp: 60 tablet, Rfl: 2   ibuprofen (ADVIL) 200 MG tablet, Take 800 mg by mouth every 6 (six) hours as needed for mild pain., Disp: , Rfl:    metoprolol succinate (TOPROL-XL) 100 MG 24 hr tablet, TAKE 1 TABLET BY MOUTH EVERY DAY (Patient taking differently: Take 100 mg by mouth daily.), Disp: 90 tablet, Rfl: 0   No Known Allergies  Past Medical History:  Diagnosis Date   Allergy    Beta thalassemia trait    Beta thalassemia trait    Genital herpes    Gestational diabetes    HPV (human papilloma virus) anogenital infection    Hx of chlamydia infection    Hx of pre-eclampsia in prior pregnancy, currently pregnant    Hypertension    Iron deficiency anemia    Menometrorrhagia      Past Surgical History:  Procedure Laterality Date   CESAREAN SECTION N/A 07/23/2019   Procedure: CESAREAN SECTION;  Surgeon: Jerelyn Charles, MD;  Location: [facility];  Service: Obstetrics;  Laterality: N/A;   COLPOSCOPY     DILATION AND EVACUATION  2009   DILATION AND EVACUATION N/A  11/08/2015   Procedure: DILATATION AND EVACUATION WITH CHROMASOME STUDIES;  Surgeon: Servando Salina, MD;  Location: [facility];  Service: Gynecology;  Laterality: N/A;    Family History  Problem Relation Age of Onset   Hypertension Mother    Diabetes Mother    Aneurysm Mother 24       brain aneurysm   Sarcoidosis Mother    Hyperlipidemia Father    Diabetes Maternal Grandmother    Stroke Maternal Grandmother    Cancer Paternal Grandmother    Crohn's disease Sister    Hypertension Sister     Social History   Tobacco Use   Smoking status: Never   Smokeless tobacco: Never  Vaping Use   Vaping Use: Never used  Substance Use Topics   Alcohol use: No   Drug use: No    ROS   Objective:   Vitals: BP (!) 156/99 (BP Location: Left Arm)   Pulse 68   Temp 98.2 F (36.8 C) (Oral)   Resp 18   LMP 01/05/2021   SpO2 100%   Breastfeeding No   Physical Exam Constitutional:      General: She is not in acute distress.    Appearance: Normal appearance. She is well-developed. She is not ill-appearing, toxic-appearing or diaphoretic.  HENT:     Head: Normocephalic and atraumatic.  Right Ear: External ear normal.     Left Ear: External ear normal.     Nose: No nasal deformity, septal deviation, signs of injury, laceration, nasal tenderness, mucosal edema, congestion or rhinorrhea.     Right Nostril: Epistaxis present. No foreign body, septal hematoma or occlusion.     Left Nostril: No foreign body, epistaxis, septal hematoma or occlusion.     Right Turbinates: Not enlarged, swollen or pale.     Left Turbinates: Not enlarged, swollen or pale.     Mouth/Throat:     Mouth: Mucous membranes are moist.     Pharynx: No oropharyngeal exudate or posterior oropharyngeal erythema.  Eyes:     General: No scleral icterus.       Right eye: No discharge.        Left eye: No discharge.     Extraocular Movements: Extraocular movements intact.     Conjunctiva/sclera: Conjunctivae normal.      Pupils: Pupils are equal, round, and reactive to light.  Cardiovascular:     Rate and Rhythm: Normal rate.  Pulmonary:     Effort: Pulmonary effort is normal.  Skin:    General: Skin is warm and dry.  Neurological:     General: No focal deficit present.     Mental Status: She is alert and oriented to person, place, and time.     Cranial Nerves: No cranial nerve deficit or facial asymmetry.     Motor: No weakness.     Coordination: Romberg sign negative. Coordination normal.     Gait: Gait normal.     Deep Tendon Reflexes: Reflexes normal.  Psychiatric:        Mood and Affect: Mood normal.        Speech: Speech normal.        Behavior: Behavior normal.        Thought Content: Thought content normal.        Judgment: Judgment normal.    Results for orders placed or performed during the hospital encounter of 01/06/21 (from the past 24 hour(s))  CBC with Differential     Status: Abnormal   Collection Time: 01/06/21  7:37 AM  Result Value Ref Range   WBC 5.3 4.0 - 10.5 K/uL   RBC 5.09 3.87 - 5.11 MIL/uL   Hemoglobin 11.4 (L) 12.0 - 15.0 g/dL   HCT 37.0 36.0 - 46.0 %   MCV 72.7 (L) 80.0 - 100.0 fL   MCH 22.4 (L) 26.0 - 34.0 pg   MCHC 30.8 30.0 - 36.0 g/dL   RDW 15.9 (H) 11.5 - 15.5 %   Platelets 393 150 - 400 K/uL   nRBC 0.0 0.0 - 0.2 %   Neutrophils Relative % 49 %   Neutro Abs 2.6 1.7 - 7.7 K/uL   Lymphocytes Relative 40 %   Lymphs Abs 2.1 0.7 - 4.0 K/uL   Monocytes Relative 9 %   Monocytes Absolute 0.5 0.1 - 1.0 K/uL   Eosinophils Relative 2 %   Eosinophils Absolute 0.1 0.0 - 0.5 K/uL   Basophils Relative 0 %   Basophils Absolute 0.0 0.0 - 0.1 K/uL   Immature Granulocytes 0 %   Abs Immature Granulocytes 0.01 0.00 - 0.07 K/uL  Brain natriuretic peptide     Status: None   Collection Time: 01/06/21  7:38 AM  Result Value Ref Range   B Natriuretic Peptide 19.8 0.0 - 100.0 pg/mL  Comprehensive metabolic panel     Status: Abnormal   Collection  Time: 01/06/21  7:38 AM   Result Value Ref Range   Sodium 138 135 - 145 mmol/L   Potassium 3.7 3.5 - 5.1 mmol/L   Chloride 105 98 - 111 mmol/L   CO2 26 22 - 32 mmol/L   Glucose, Bld 107 (H) 70 - 99 mg/dL   BUN 12 6 - 20 mg/dL   Creatinine, Ser 0.88 0.44 - 1.00 mg/dL   Calcium 8.6 (L) 8.9 - 10.3 mg/dL   Total Protein 6.9 6.5 - 8.1 g/dL   Albumin 3.4 (L) 3.5 - 5.0 g/dL   AST 25 15 - 41 U/L   ALT 20 0 - 44 U/L   Alkaline Phosphatase 75 38 - 126 U/L   Total Bilirubin 0.4 0.3 - 1.2 mg/dL   GFR, Estimated >60 >60 mL/min   Anion gap 7 5 - 15  POC Urine Pregnancy, ED (not at [facility])     Status: None   Collection Time: 01/06/21  8:02 AM  Result Value Ref Range   Preg Test, Ur NEGATIVE NEGATIVE  Urinalysis, Routine w reflex microscopic Urine, Clean Catch     Status: Abnormal   Collection Time: 01/06/21 10:30 AM  Result Value Ref Range   Color, Urine PINK (A) YELLOW   APPearance TURBID (A) CLEAR   Specific Gravity, Urine  1.005 - 1.030    TEST NOT REPORTED DUE TO COLOR INTERFERENCE OF URINE PIGMENT   pH  5.0 - 8.0    TEST NOT REPORTED DUE TO COLOR INTERFERENCE OF URINE PIGMENT   Glucose, UA (A) NEGATIVE mg/dL    TEST NOT REPORTED DUE TO COLOR INTERFERENCE OF URINE PIGMENT   Hgb urine dipstick (A) NEGATIVE    TEST NOT REPORTED DUE TO COLOR INTERFERENCE OF URINE PIGMENT   Bilirubin Urine (A) NEGATIVE    TEST NOT REPORTED DUE TO COLOR INTERFERENCE OF URINE PIGMENT   Ketones, ur (A) NEGATIVE mg/dL    TEST NOT REPORTED DUE TO COLOR INTERFERENCE OF URINE PIGMENT   Protein, ur (A) NEGATIVE mg/dL    TEST NOT REPORTED DUE TO COLOR INTERFERENCE OF URINE PIGMENT   Nitrite NEGATIVE NEGATIVE   Leukocytes,Ua NEGATIVE NEGATIVE  Urinalysis, Microscopic (reflex)     Status: Abnormal   Collection Time: 01/06/21 10:30 AM  Result Value Ref Range   RBC / HPF >50 0 - 5 RBC/hpf   WBC, UA 0-5 0 - 5 WBC/hpf   Bacteria, UA RARE (A) NONE SEEN   Squamous Epithelial / LPF 0-5 0 - 5    Assessment and Plan :   PDMP not reviewed  this encounter.  1. Epistaxis   2. Essential hypertension     Pressure, Afrin were attempted but were unsuccessful at stopping her epistaxis. Unfortunately I am not able to insert a rhino rocket here. Recommended patient present to the ER to have this done. Maintain all other medications. No signs of stroke on exam and therefore will have patient present to ER by personal vehicle.    Jaynee Eagles, [place] 01/06/21 1952

## 2021-01-06 NOTE — ED Provider Notes (Signed)
Emergency Medicine Provider Triage Evaluation Note  Emily Downs , a 40 y.o. female  was evaluated in triage.  Pt complains of nosebleed.  Review of Systems  Positive: Nosebleed, headache Negative: Dizzy, cp, sob, abd pain  Physical Exam  LMP 01/05/2021  Gen:   Awake, no distress   Resp:  Normal effort  MSK:   Moves extremities without difficulty  Other:   Blood noted in R nare  Medical Decision Making  Medically screening exam initiated at 8:46 PM.  Appropriate orders placed.  Emily Downs was informed that the remainder of the evaluation will be completed by another provider, this initial triage assessment does not replace that evaluation, and the importance of remaining in the ED until their evaluation is complete.  Recurrent nosebleed throughout the day today.  Was seen earlier in the day for elevated BP.  Went to [facility] for nosebleed but sent here due to not having ability to apply rhino rocket.  Pt is not on any blood thinner.  No injury.Pt worries she's looking a lot of blood.    Domenic Moras, PA-C 01/06/21 2058    Hayden Rasmussen, MD 01/06/21 2121

## 2021-01-06 NOTE — ED Triage Notes (Signed)
Pt. Stated, Donnald Garre been out of one of my BP medications for over a month, this morning I woke up and had a nose bleed and have never had a nose bleed. Im out of my Amolodopine 10 mg. Some slight headache.

## 2021-01-07 ENCOUNTER — Emergency Department (HOSPITAL_COMMUNITY): Admission: EM | Admit: 2021-01-07 | Payer: BC Managed Care – PPO | Source: Home / Self Care

## 2021-01-07 DIAGNOSIS — R040 Epistaxis: Secondary | ICD-10-CM | POA: Diagnosis not present

## 2021-01-07 MED ORDER — IBUPROFEN 400 MG PO TABS
600.0000 mg | ORAL_TABLET | Freq: Once | ORAL | Status: AC
  Administered 2021-01-07: 600 mg via ORAL
  Filled 2021-01-07: qty 1

## 2021-01-07 MED ORDER — METOPROLOL SUCCINATE ER 25 MG PO TB24: 100.0000 mg | ORAL_TABLET | Freq: Every day | ORAL | Status: DC

## 2021-01-07 MED ORDER — OXYMETAZOLINE HCL 0.05 % NA SOLN
1.0000 | Freq: Once | NASAL | Status: AC
  Administered 2021-01-07: 1 via NASAL

## 2021-01-07 MED ORDER — ACETAMINOPHEN 500 MG PO TABS: 1000.0000 mg | ORAL_TABLET | Freq: Once | ORAL | Status: DC

## 2021-01-07 MED ORDER — CEPHALEXIN 500 MG PO CAPS: 500.0000 mg | ORAL_CAPSULE | Freq: Three times a day (TID) | ORAL | 0 refills | Status: AC

## 2021-01-07 MED ORDER — AMLODIPINE BESYLATE 5 MG PO TABS
10.0000 mg | ORAL_TABLET | Freq: Once | ORAL | Status: AC
  Administered 2021-01-07: 10 mg via ORAL
  Filled 2021-01-07: qty 2

## 2021-01-07 MED ORDER — ONDANSETRON 4 MG PO TBDP
4.0000 mg | ORAL_TABLET | Freq: Once | ORAL | Status: AC
  Administered 2021-01-07: 4 mg via ORAL
  Filled 2021-01-07: qty 1

## 2021-01-07 MED ORDER — LIDOCAINE-EPINEPHRINE-TETRACAINE (LET) TOPICAL GEL
3.0000 mL | Freq: Once | TOPICAL | Status: AC
  Administered 2021-01-07: 3 mL via TOPICAL

## 2021-01-07 NOTE — ED Notes (Signed)
Bleeding controlled at this time.

## 2021-01-07 NOTE — Consult Note (Signed)
Reason for Consult: Severe right epistaxis Referring Physician: Tedd Sias, PA   HPI:  Emily Downs is an 40 y.o. female who presents to the emergency department with complaint of severe right sided nosebleed.  Per patient she was seen at the ER yesterday for the same problem. Her blood pressure was elevated at that time but she has been out of her amlodipine for the last month.  She was given a prescription but did not have time to fill it.  Patient reports when she got home she noticed that her nose was bleeding again and she returned immediately to the emergency department.  She reports she has a mild headache but denies vision changes, neck pain, neck stiffness, chest pain, shortness of breath, Donnell pain, nausea, vomiting, diarrhea.  Denies known trauma or falls. She is not anticoagulated.   Past Medical History:  Diagnosis Date   Allergy    Beta thalassemia trait    Beta thalassemia trait    Genital herpes    Gestational diabetes    HPV (human papilloma virus) anogenital infection    Hx of chlamydia infection    Hx of pre-eclampsia in prior pregnancy, currently pregnant    Hypertension    Iron deficiency anemia    Menometrorrhagia     Past Surgical History:  Procedure Laterality Date   CESAREAN SECTION N/A 07/23/2019   Procedure: CESAREAN SECTION;  Surgeon: Jerelyn Charles, MD;  Location: [facility];  Service: Obstetrics;  Laterality: N/A;   COLPOSCOPY     DILATION AND EVACUATION  2009   DILATION AND EVACUATION N/A 11/08/2015   Procedure: DILATATION AND EVACUATION WITH CHROMASOME STUDIES;  Surgeon: Servando Salina, MD;  Location: [place] [facility];  Service: Gynecology;  Laterality: N/A;    Family History  Problem Relation Age of Onset   Hypertension Mother    Diabetes Mother    Aneurysm Mother 8       brain aneurysm   Sarcoidosis Mother    Hyperlipidemia Father    Diabetes Maternal Grandmother    Stroke Maternal Grandmother    Cancer Paternal Grandmother    Crohn's  disease Sister    Hypertension Sister     Social History:  reports that she has never smoked. She has never used smokeless tobacco. She reports that she does not drink alcohol and does not use drugs.  Allergies: No Known Allergies  Prior to Admission medications   Medication Sig Start Date End Date Taking? Authorizing Provider  cephALEXin (KEFLEX) 500 MG capsule Take 1 capsule (500 mg total) by mouth 3 (three) times daily for 5 days. 01/07/21 01/12/21 Yes Fondaw, Wylder S, PA  amLODipine (NORVASC) 10 MG tablet Take 10 mg by mouth daily. 09/26/19   [provider]  amLODipine (NORVASC) 10 MG tablet Take 1 tablet (10 mg total) by mouth daily. 01/06/21   Fredia Sorrow, MD  cetirizine (ZYRTEC) 10 MG tablet Take 10 mg by mouth daily.    [provider]  diclofenac (VOLTAREN) 75 MG EC tablet Take 1 tablet (75 mg total) by mouth 2 (two) times daily as needed. Patient not taking: No sig reported 06/07/20   Aundra Dubin, PA-C  ibuprofen (ADVIL) 200 MG tablet Take 800 mg by mouth every 6 (six) hours as needed for mild pain.    [provider]  metoprolol succinate (TOPROL-XL) 100 MG 24 hr tablet TAKE 1 TABLET BY MOUTH EVERY DAY Patient taking differently: Take 100 mg by mouth daily. 12/03/20   Agustina Caroli  Jacqulyn Bath, MD    Medications: Prior to Admission: (Not in a hospital admission)   Results for orders placed or performed during the hospital encounter of 01/06/21 (from the past 48 hour(s))  I-stat chem 8, ED (not at [facility] or [facility])     Status: Abnormal   Collection Time: 01/06/21  9:26 PM  Result Value Ref Range   Sodium 141 135 - 145 mmol/L   Potassium 3.9 3.5 - 5.1 mmol/L   Chloride 106 98 - 111 mmol/L   BUN 14 6 - 20 mg/dL   Creatinine, Ser 0.80 0.44 - 1.00 mg/dL   Glucose, Bld 99 70 - 99 mg/dL    Comment: Glucose reference range applies only to samples taken after fasting for at least 8 hours.   Calcium, Ion 1.14 (L) 1.15 - 1.40 mmol/L   TCO2 28 22 - 32  mmol/L   Hemoglobin 13.3 12.0 - 15.0 g/dL   HCT 39.0 36.0 - 46.0 %    No results found.  Review of Systems  Constitutional:  Negative for appetite change, diaphoresis, fatigue, fever and unexpected weight change.  HENT:  Positive for nosebleeds. Negative for mouth sores.   Eyes:  Negative for visual disturbance.  Respiratory:  Negative for cough, chest tightness, shortness of breath and wheezing.   Cardiovascular:  Negative for chest pain.  Gastrointestinal:  Negative for abdominal pain, constipation, diarrhea, nausea and vomiting.  Endocrine: Negative for polydipsia, polyphagia and polyuria.  Genitourinary:  Negative for dysuria, frequency, hematuria and urgency.  Musculoskeletal:  Negative for back pain and neck stiffness.  Skin:  Negative for rash.  Allergic/Immunologic: Negative for immunocompromised state.  Neurological:  Positive for headaches. Negative for syncope and light-headedness.  Hematological:  Does not bruise/bleed easily.  Psychiatric/Behavioral:  Negative for sleep disturbance. The patient is not nervous/anxious.    Blood pressure (!) 139/97, pulse 88, temperature 98.6 F (37 C), temperature source Oral, resp. rate 18, height 5' (1.524 m), weight 73 kg, last menstrual period 01/05/2021, SpO2 100 %, not currently breastfeeding. General appearance: alert, cooperative, and appears stated age Head: Normocephalic, without obvious abnormality, atraumatic Eyes: Pupils are equal, round, reactive to light. Extraocular motion is intact.  Ears: Examination of the ears shows normal auricles and external auditory canals bilaterally.  Nose: Right merocel packing in place. No active bleeding. Face: Facial examination shows no asymmetry. Palpation of the face elicit no significant tenderness.  Mouth: Oral cavity examination shows no mucosal lacerations. No significant trismus is noted.  Neck: Palpation of the neck reveals no lymphadenopathy or mass. The trachea is midline. The  thyroid is not significantly enlarged.  Neuro: Cranial nerves 2-12 are all grossly in tact.   Assessment/Plan: Recurrent right epistaxis, s/p merocel packing by the ER provider. - Will leave packing in place for 4-5 days. - Gram + abx while packing is in place. - Pt will follow up with me as an outpatient.  Toretto Tingler W Emillie Chasen 01/07/2021, 12:47 PM

## 2021-01-07 NOTE — ED Provider Notes (Addendum)
[facility] Provider Note   CSN: [id number] Arrival date & time: 01/06/21  2000     History Chief Complaint  Patient presents with   Epistaxis    Emily Downs is a 40 y.o. female presents to the emergency department with complaints of nosebleed.  Per patient she was seen earlier today for the same without specific treatment.  Her blood pressure was elevated at that time but she has been out of her amlodipine for the last month.  She was given a prescription earlier today but did not have time to fill it.  Patient reports when she got home she noticed that her nose was bleeding again and she returned immediately to the emergency department.  She reports she tried to go to urgent care however was told they did not have the supplies needed for packing.  She reports she has a mild headache but denies vision changes, neck pain, neck stiffness, chest pain, shortness of breath, Donnell pain, nausea, vomiting, diarrhea.  Denies known trauma or falls.  Is not anticoagulated.  The history is provided by the patient and medical records. No language interpreter was used.      Past Medical History:  Diagnosis Date   Allergy    Beta thalassemia trait    Beta thalassemia trait    Genital herpes    Gestational diabetes    HPV (human papilloma virus) anogenital infection    Hx of chlamydia infection    Hx of pre-eclampsia in prior pregnancy, currently pregnant    Hypertension    Iron deficiency anemia    Menometrorrhagia     Patient Active Problem List   Diagnosis Date Noted   S/P cesarean section 07/26/2019   Benign essential hypertension antepartum in third trimester 07/23/2019   Near syncope 06/27/2019   Dehydration during pregnancy 06/27/2019   Essential hypertension, benign 12/11/2013   Menometrorrhagia    Iron deficiency anemia     Past Surgical History:  Procedure Laterality Date   CESAREAN SECTION N/A 07/23/2019   Procedure:  CESAREAN SECTION;  Surgeon: Jerelyn Charles, MD;  Location: [facility];  Service: Obstetrics;  Laterality: N/A;   COLPOSCOPY     DILATION AND EVACUATION  2009   DILATION AND EVACUATION N/A 11/08/2015   Procedure: DILATATION AND EVACUATION WITH CHROMASOME STUDIES;  Surgeon: Servando Salina, MD;  Location: [facility];  Service: Gynecology;  Laterality: N/A;     OB History     Gravida  4   Para  3   Term  3   Preterm      AB  1   Living  3      SAB  1   IAB      Ectopic      Multiple  0   Live Births  3           Family History  Problem Relation Age of Onset   Hypertension Mother    Diabetes Mother    Aneurysm Mother 21       brain aneurysm   Sarcoidosis Mother    Hyperlipidemia Father    Diabetes Maternal Grandmother    Stroke Maternal Grandmother    Cancer Paternal Grandmother    Crohn's disease Sister    Hypertension Sister     Social History   Tobacco Use   Smoking status: Never   Smokeless tobacco: Never  Vaping Use   Vaping Use: Never used  Substance Use Topics   Alcohol  use: No   Drug use: No    Home Medications Prior to Admission medications   Medication Sig Start Date End Date Taking? Authorizing Provider  amLODipine (NORVASC) 10 MG tablet Take 10 mg by mouth daily. 09/26/19   [provider]  amLODipine (NORVASC) 10 MG tablet Take 1 tablet (10 mg total) by mouth daily. 01/06/21   Fredia Sorrow, MD  cetirizine (ZYRTEC) 10 MG tablet Take 10 mg by mouth daily.    [provider]  diclofenac (VOLTAREN) 75 MG EC tablet Take 1 tablet (75 mg total) by mouth 2 (two) times daily as needed. Patient not taking: No sig reported 06/07/20   Aundra Dubin, PA-C  ibuprofen (ADVIL) 200 MG tablet Take 800 mg by mouth every 6 (six) hours as needed for mild pain.    [provider]  metoprolol succinate (TOPROL-XL) 100 MG 24 hr tablet TAKE 1 TABLET BY MOUTH EVERY DAY Patient taking differently: Take 100 mg by mouth daily. 12/03/20    Horald Pollen, MD    Allergies    Patient has no known allergies.  Review of Systems   Review of Systems  Constitutional:  Negative for appetite change, diaphoresis, fatigue, fever and unexpected weight change.  HENT:  Positive for nosebleeds. Negative for mouth sores.   Eyes:  Negative for visual disturbance.  Respiratory:  Negative for cough, chest tightness, shortness of breath and wheezing.   Cardiovascular:  Negative for chest pain.  Gastrointestinal:  Negative for abdominal pain, constipation, diarrhea, nausea and vomiting.  Endocrine: Negative for polydipsia, polyphagia and polyuria.  Genitourinary:  Negative for dysuria, frequency, hematuria and urgency.  Musculoskeletal:  Negative for back pain and neck stiffness.  Skin:  Negative for rash.  Allergic/Immunologic: Negative for immunocompromised state.  Neurological:  Positive for headaches. Negative for syncope and light-headedness.  Hematological:  Does not bruise/bleed easily.  Psychiatric/Behavioral:  Negative for sleep disturbance. The patient is not nervous/anxious.    Physical Exam Updated Vital Signs BP (!) 162/100 (BP Location: Left Arm)   Pulse 62   Temp 98.6 F (37 C) (Oral)   Resp 18   Ht 5' (1.524 m)   Wt 73 kg   LMP 01/05/2021   SpO2 100%   BMI 31.43 kg/m   Physical Exam Vitals and nursing note reviewed.  Constitutional:      General: She is not in acute distress.    Appearance: She is not diaphoretic.  HENT:     Head: Normocephalic.  Eyes:     General: No scleral icterus.    Conjunctiva/sclera: Conjunctivae normal.  Cardiovascular:     Rate and Rhythm: Normal rate and regular rhythm.     Pulses: Normal pulses.          Radial pulses are 2+ on the right side and 2+ on the left side.  Pulmonary:     Effort: No tachypnea, accessory muscle usage, prolonged expiration, respiratory distress or retractions.     Breath sounds: No stridor.     Comments: Equal chest rise. No increased work  of breathing. Abdominal:     General: There is no distension.     Palpations: Abdomen is soft.     Tenderness: There is no abdominal tenderness. There is no guarding or rebound.  Musculoskeletal:     Cervical back: Normal range of motion.     Comments: Moves all extremities equally and without difficulty.  Skin:    General: Skin is warm and dry.  Capillary Refill: Capillary refill takes less than 2 seconds.  Neurological:     Mental Status: She is alert.     GCS: GCS eye subscore is 4. GCS verbal subscore is 5. GCS motor subscore is 6.     Comments: Speech is clear and goal oriented.  Psychiatric:        Mood and Affect: Mood normal.    ED Results / Procedures / Treatments   Labs (all labs ordered are listed, but only abnormal results are displayed) Labs Reviewed  I-STAT CHEM 8, ED - Abnormal; Notable for the following components:      Result Value   Calcium, Ion 1.14 (*)    All other components within normal limits     Procedures .Epistaxis Management  Date/Time: 01/07/2021 3:23 AM Performed by: Abigail Butts, PA-C Authorized by: Abigail Butts, PA-C   Consent:    Consent obtained:  Verbal   Consent given by:  Patient   Risks, benefits, and alternatives were discussed: yes     Risks discussed:  Bleeding, infection, nasal injury and pain   Alternatives discussed:  No treatment Universal protocol:    Procedure explained and questions answered to patient or proxy's satisfaction: yes     Relevant documents present and verified: yes     Test results available: yes     Imaging studies available: yes     Required blood products, implants, devices, and special equipment available: yes     Site/side marked: yes     Immediately prior to procedure, a time out was called: yes     Patient identity confirmed:  Verbally with patient and arm band Anesthesia:    Anesthesia method:  Topical application   Topical anesthetic:  Lidocaine gel Procedure details:     Treatment site:  R anterior   Treatment method:  Anterior pack   Treatment complexity:  Limited   Treatment episode: initial   Post-procedure details:    Assessment:  Bleeding stopped   Procedure completion:  Tolerated well, no immediate complications .Epistaxis Management  Date/Time: 01/07/2021 4:52 AM Performed by: Abigail Butts, PA-C Authorized by: Abigail Butts, PA-C   Consent:    Consent obtained:  Verbal   Consent given by:  Patient   Risks, benefits, and alternatives were discussed: yes     Risks discussed:  Bleeding, infection, nasal injury and pain   Alternatives discussed:  No treatment Universal protocol:    Procedure explained and questions answered to patient or proxy's satisfaction: yes     Relevant documents present and verified: yes     Test results available: yes     Imaging studies available: yes     Required blood products, implants, devices, and special equipment available: yes     Site/side marked: yes     Immediately prior to procedure, a time out was called: yes     Patient identity confirmed:  Verbally with patient and arm band Anesthesia:    Anesthesia method:  Topical application   Topical anesthetic:  LET Procedure details:    Treatment site:  Unable to specify   Treatment method:  Anterior pack   Treatment complexity:  Extensive   Treatment episode: recurring   Post-procedure details:    Assessment:  Bleeding decreased   Procedure completion:  Tolerated with difficulty   Medications Ordered in ED Medications  ibuprofen (ADVIL) tablet 600 mg (600 mg Oral Given 01/07/21 0312)  oxymetazoline (AFRIN) 0.05 % nasal spray 1 spray (1 spray Each Nare Given 01/07/21  5)  lidocaine-EPINEPHrine-tetracaine (LET) topical gel (3 mLs Topical Given 01/07/21 0436)  ondansetron (ZOFRAN-ODT) disintegrating tablet 4 mg (4 mg Oral Given 01/07/21 7414)    ED Course  I have reviewed the triage vital signs and the nursing notes.  Pertinent labs &  imaging results that were available during my care of the patient were reviewed by me and considered in my medical decision making (see chart for details).    MDM Rules/Calculators/A&P                           Patient presents with epistaxis, recurrent from earlier.  On my exam minimal bleeding.  No visualization of bleeding site.  Patient able to blow her nose with mostly clear mucus.  Given return will utilize nasal packing.  Hypertension noted however patient has not taken her home medications today.  3:24 AM Patient headache improved.  Remains neurologically intact.  No persistent bleeding.  Blood pressure is beginning to improve.  Encouraged patient to continue taking home medications, refill amlodipine and follow-up with primary care for repeat blood pressure check.  She will see ENT on Monday for reassessment of her epistaxis.  Discussed reasons to return to the emergency department.  Patient states understanding and is in agreement the plan.  BP (!) 153/98 (BP Location: Left Arm)   Pulse 72   Temp 97.8 F (36.6 C) (Oral)   Resp 16   Ht 5' (1.524 m)   Wt 73 kg   LMP 01/05/2021   SpO2 100%   BMI 31.43 kg/m    4:49 AM Pt returns to the ED after discharge (but before leaving the premesis) when her nasal packing fell out and she began bleeding again.   Attempted to place merocel in triage, but pt vomited and dislodged the packing.  LET and cotton ball packing placed to obtain better view.    6:50 AM At shift change care was transferred to [facility] who will monitor and determine disposition.       Final Clinical Impression(s) / ED Diagnoses Final diagnoses:  Right-sided epistaxis  Hypertension, unspecified type    Rx / DC Orders ED Discharge Orders     None        Lemon Whitacre, Gwenlyn Perking 01/07/21 0326    Charls Custer, Jarrett Soho, PA-C 01/07/21 2395    Quintella Reichert, MD 01/08/21 [phone]

## 2021-01-07 NOTE — Discharge Instructions (Addendum)
1. Medications: refill your Amlodipine, usual home medications 2. Treatment: rest, drink plenty of fluids, keep nasal packing in place until evaluation by ENT 3. Follow Up: Please followup with your primary doctor in 2 days for discussion of your diagnoses and further evaluation after today's visit; if you do not have a primary care doctor use the resource guide provided to find one; Please return to the ER for fevers, persistent bleeding or other concerns

## 2021-01-07 NOTE — ED Provider Notes (Addendum)
Accepted handoff at shift change from [facility]. Please see prior provider note for more detail.   Briefly: Patient is 40 y.o.    Plan: Reassess in 2 hours approximately 8:30 AM If no recurrent bleeding discharge home.    Physical Exam  BP (!) 151/104   Pulse 87   Temp 97.8 F (36.6 C) (Oral)   Resp 19   Ht 5' (1.524 m)   Wt 73 kg   LMP 01/05/2021   SpO2 100%   BMI 31.43 kg/m   Physical Exam Right-sided nasal packing in place.  No blood in posterior pharynx.  No blood from external nare  ED Course/Procedures     Procedures Results for orders placed or performed during the hospital encounter of 01/06/21  I-stat chem 8, ED (not at [facility] or [facility])  Result Value Ref Range   Sodium 141 135 - 145 mmol/L   Potassium 3.9 3.5 - 5.1 mmol/L   Chloride 106 98 - 111 mmol/L   BUN 14 6 - 20 mg/dL   Creatinine, Ser 0.80 0.44 - 1.00 mg/dL   Glucose, Bld 99 70 - 99 mg/dL   Calcium, Ion 1.14 (L) 1.15 - 1.40 mmol/L   TCO2 28 22 - 32 mmol/L   Hemoglobin 13.3 12.0 - 15.0 g/dL   HCT 39.0 36.0 - 46.0 %   No results found.  MDM  Patient no longer bleeding at 8:45 AM.  She does not have her amlodipine with her but does have metoprolol with her.  I recommend that she take her morning dose as prescribed.  We will give her the amlodipine prior to discharge.       Pati Gallo [place], [place] 01/07/21 [id number]   At time of discharge patient has begun bleeding again.  She is spitting out large bloody clots.  Will talk to on-call ENT.   10:17 AM discussed with Dr. Benjamine Mola of ENT who will assess.   11:37 AM Dr. Benjamine Mola of ENT has assessed patient.  Recommends follow-up in office.  Information given to patient for ENT provider.  Will cover with Keflex.  Tedd Sias, [place] 01/07/21 [place], [place], [place] 01/07/21 1138    Pattricia Boss, MD 01/07/21 [phone]

## 2021-01-07 NOTE — ED Notes (Signed)
Pt nasal packing fell out when pt was walking out the door. Pt was immediately pulled back into triage room, Jarrett Soho PA notified and came to bedside.

## 2021-01-09 ENCOUNTER — Telehealth: Payer: Self-pay

## 2021-01-09 NOTE — Telephone Encounter (Signed)
Transition Care Management Follow-up Telephone Call Date of discharge and from where: 01/07/2021-[facility] How have you been since you were released from the hospital? Patient stated she is doing ok but her nose is still bleeding. She has a follow up with her PCP tomorrow.  Any questions or concerns? No  Items Reviewed: Did the pt receive and understand the discharge instructions provided? Yes  Medications obtained and verified? Yes  Other? No  Any new allergies since your discharge? No  Dietary orders reviewed? No Do you have support at home? Yes   Home Care and Equipment/Supplies: Were home health services ordered? not applicable If so, what is the name of the agency? N/A  Has the agency set up a time to come to the patient's home? not applicable Were any new equipment or medical supplies ordered?  No What is the name of the medical supply agency? N/A Were you able to get the supplies/equipment? not applicable Do you have any questions related to the use of the equipment or supplies? No  Functional Questionnaire: (I = Independent and D = Dependent) ADLs: I  Bathing/Dressing- I  Meal Prep- I  Eating- I  Maintaining continence- I  Transferring/Ambulation- I  Managing Meds- I  Follow up appointments reviewed:  PCP Hospital f/u appt confirmed? Yes  Scheduled to see Dr. Sharlet Salina on 01/10/2021 @ 1:40pm. [facility] f/u appt confirmed? No   Are transportation arrangements needed? No  If their condition worsens, is the pt aware to call PCP or go to the Emergency Dept.? Yes Was the patient provided with contact information for the PCP's office or ED? Yes Was to pt encouraged to call back with questions or concerns? Yes

## 2021-01-10 ENCOUNTER — Other Ambulatory Visit: Payer: Self-pay

## 2021-01-10 ENCOUNTER — Ambulatory Visit (INDEPENDENT_AMBULATORY_CARE_PROVIDER_SITE_OTHER): Payer: BC Managed Care – PPO | Admitting: Internal Medicine

## 2021-01-10 ENCOUNTER — Encounter: Payer: Self-pay | Admitting: Internal Medicine

## 2021-01-10 VITALS — BP 126/90 | HR 91 | Resp 18 | Ht 60.0 in | Wt 158.2 lb

## 2021-01-10 DIAGNOSIS — R040 Epistaxis: Secondary | ICD-10-CM | POA: Diagnosis not present

## 2021-01-10 DIAGNOSIS — G4489 Other headache syndrome: Secondary | ICD-10-CM

## 2021-01-10 NOTE — Progress Notes (Signed)
   Subjective:   Patient ID: Emily Downs, female    DOB: 01/30/1981, 40 y.o.   MRN: [id number]  HPI The patient is a 40 YO female coming in for nosebleed. No prior and no recent change. Some allergies no nose spray used. Takes zyrtec.  Review of Systems  Constitutional: Negative.   HENT:  Positive for nosebleeds.   Eyes: Negative.   Respiratory:  Negative for cough, chest tightness and shortness of breath.   Cardiovascular:  Negative for chest pain, palpitations and leg swelling.  Gastrointestinal:  Negative for abdominal distention, abdominal pain, constipation, diarrhea, nausea and vomiting.  Musculoskeletal: Negative.   Skin: Negative.   Neurological: Negative.   Psychiatric/Behavioral: Negative.     Objective:  Physical Exam Constitutional:      Appearance: She is well-developed.  HENT:     Head: Normocephalic and atraumatic.     Ears:     Comments: Nose with packing in place, not removed during visit. Cardiovascular:     Rate and Rhythm: Normal rate and regular rhythm.  Pulmonary:     Effort: Pulmonary effort is normal. No respiratory distress.     Breath sounds: Normal breath sounds. No wheezing or rales.  Abdominal:     General: Bowel sounds are normal. There is no distension.     Palpations: Abdomen is soft.     Tenderness: There is no abdominal tenderness. There is no rebound.  Musculoskeletal:     Cervical back: Normal range of motion.  Skin:    General: Skin is warm and dry.  Neurological:     Mental Status: She is alert and oriented to person, place, and time.     Coordination: Coordination normal.    Vitals:   01/10/21 1343  BP: 126/90  Pulse: 91  Resp: 18  SpO2: 100%  Weight: 158 lb 3.2 oz (71.8 kg)  Height: 5' (1.524 m)    This visit occurred during the SARS-CoV-2 public health emergency.  Safety protocols were in place, including screening questions prior to the visit, additional usage of staff PPE, and extensive cleaning of exam room  while observing appropriate contact time as indicated for disinfecting solutions.   Assessment & Plan:

## 2021-01-10 NOTE — Patient Instructions (Addendum)
We will get the CT scan of the head to check for a cause of the bleeding.

## 2021-01-11 DIAGNOSIS — R040 Epistaxis: Secondary | ICD-10-CM | POA: Diagnosis not present

## 2021-01-13 ENCOUNTER — Encounter: Payer: Self-pay | Admitting: Internal Medicine

## 2021-01-13 DIAGNOSIS — G4489 Other headache syndrome: Secondary | ICD-10-CM | POA: Insufficient documentation

## 2021-01-13 DIAGNOSIS — R040 Epistaxis: Secondary | ICD-10-CM | POA: Insufficient documentation

## 2021-01-13 NOTE — Assessment & Plan Note (Signed)
Pain in her sinuses associated with new nosebleed. Ordered CT head due to suddenness of the change with bleeding from the nose.

## 2021-01-13 NOTE — Assessment & Plan Note (Signed)
Advised to keep upcoming visit with ENT where they will remove packing and try for definitive treatment. Talked about holding pressure, affrin to help reduce risk for recurrence. Advised to avoid picking, blowing of the nose.

## 2021-01-20 ENCOUNTER — Other Ambulatory Visit: Payer: Self-pay

## 2021-01-20 ENCOUNTER — Encounter: Payer: Self-pay | Admitting: Internal Medicine

## 2021-01-20 ENCOUNTER — Ambulatory Visit (INDEPENDENT_AMBULATORY_CARE_PROVIDER_SITE_OTHER): Payer: BC Managed Care – PPO | Admitting: Internal Medicine

## 2021-01-20 VITALS — BP 122/70 | HR 74 | Ht 60.0 in | Wt 164.8 lb

## 2021-01-20 DIAGNOSIS — I10 Essential (primary) hypertension: Secondary | ICD-10-CM | POA: Diagnosis not present

## 2021-01-20 DIAGNOSIS — R739 Hyperglycemia, unspecified: Secondary | ICD-10-CM

## 2021-01-20 DIAGNOSIS — M25562 Pain in left knee: Secondary | ICD-10-CM | POA: Insufficient documentation

## 2021-01-20 NOTE — Progress Notes (Signed)
Patient ID: Emily Downs, female   DOB: 05-Dec-1980, 40 y.o.   MRN: [id number]        Chief Complaint: follow up post fall left knee pain       HPI:  Emily Downs is a 40 y.o. female here with c/o acute onset medial left knee pain with localized swelling after twist and fall and slip down the last 4 stairs while holding the baby  (baby not hurt) 2 days ago; initially thought the pain would simply just get better but pain now moderate, constant, sharp, worse to walk, and better to sit.  No further falls.  Denies fever, chills, other trauma , hx of gout or similar.  Can still stand up to 2 hrs but then has to sit - just too much, and normally can stand all day. Pt denies chest pain, increased sob or doe, wheezing, orthopnea, PND, increased LE swelling, palpitations, dizziness or syncope.   Pt denies polydipsia, polyuria, or new focal neuro s/s.    No overt bleeding, was seen in ED oct 4 with nosebleed and glc 107.  Incidnetly has half percocets left over at home.         Wt Readings from Last 3 Encounters:  01/20/21 164 lb 12.8 oz (74.8 kg)  01/10/21 158 lb 3.2 oz (71.8 kg)  01/06/21 160 lb 15 oz (73 kg)   BP Readings from Last 3 Encounters:  01/20/21 122/70  01/10/21 126/90  01/07/21 (!) 139/97         Past Medical History:  Diagnosis Date   Allergy    Beta thalassemia trait    Beta thalassemia trait    Genital herpes    Gestational diabetes    HPV (human papilloma virus) anogenital infection    Hx of chlamydia infection    Hx of pre-eclampsia in prior pregnancy, currently pregnant    Hypertension    Iron deficiency anemia    Menometrorrhagia    Past Surgical History:  Procedure Laterality Date   CESAREAN SECTION N/A 07/23/2019   Procedure: CESAREAN SECTION;  Surgeon: Jerelyn Charles, MD;  Location: [facility];  Service: Obstetrics;  Laterality: N/A;   COLPOSCOPY     DILATION AND EVACUATION  2009   DILATION AND EVACUATION N/A 11/08/2015   Procedure: DILATATION AND  EVACUATION WITH CHROMASOME STUDIES;  Surgeon: Servando Salina, MD;  Location: [facility];  Service: Gynecology;  Laterality: N/A;    reports that she has never smoked. She has never used smokeless tobacco. She reports that she does not drink alcohol and does not use drugs. family history includes Aneurysm (age of onset: 21) in her mother; Cancer in her paternal grandmother; Crohn's disease in her sister; Diabetes in her maternal grandmother and mother; Hyperlipidemia in her father; Hypertension in her mother and sister; Sarcoidosis in her mother; Stroke in her maternal grandmother. No Known Allergies Current Outpatient Medications on File Prior to Visit  Medication Sig Dispense Refill   amLODipine (NORVASC) 10 MG tablet Take 10 mg by mouth daily.     amLODipine (NORVASC) 10 MG tablet Take 1 tablet (10 mg total) by mouth daily. 30 tablet 2   cetirizine (ZYRTEC) 10 MG tablet Take 10 mg by mouth daily.     metoprolol succinate (TOPROL-XL) 100 MG 24 hr tablet TAKE 1 TABLET BY MOUTH EVERY DAY (Patient taking differently: Take 100 mg by mouth daily.) 90 tablet 0   diclofenac (VOLTAREN) 75 MG EC tablet Take 1 tablet (75 mg total) by mouth  2 (two) times daily as needed. (Patient not taking: Reported on 01/20/2021) 60 tablet 2   No current facility-administered medications on file prior to visit.        ROS:  All others reviewed and negative.  Objective        PE:  BP 122/70 (BP Location: Right Arm, Patient Position: Sitting, Cuff Size: Normal)   Pulse 74   Ht 5' (1.524 m)   Wt 164 lb 12.8 oz (74.8 kg)   LMP 01/05/2021   SpO2 100%   BMI 32.19 kg/m                 Constitutional: Pt appears in NAD               HENT: Head: NCAT.                Right Ear: External ear normal.                 Left Ear: External ear normal.                Eyes: . Pupils are equal, round, and reactive to light. Conjunctivae and EOM are normal               Nose: without d/c or deformity               Neck: Neck  supple. Gross normal ROM               Cardiovascular: Normal rate and regular rhythm.                 Pulmonary/Chest: Effort normal and breath sounds without rales or wheezing.                Abd:  Soft, NT, ND, + BS, no organomegaly               Neurological: Pt is alert. At baseline orientation, motor grossly intact               Skin: Skin is warm. No rashes, no other new lesions, LE edema - none               Left knee with moderate swelling and tender over medial knee only, with decreased ROM to 120 flexion only               Psychiatric: Pt behavior is normal without agitation   Micro: none  Cardiac tracings I have personally interpreted today:  none  Pertinent Radiological findings (summarize): none   Lab Results  Component Value Date   WBC 5.3 01/06/2021   HGB 13.3 01/06/2021   HCT 39.0 01/06/2021   PLT 393 01/06/2021   GLUCOSE 99 01/06/2021   CHOL 184 07/31/2016   TRIG 78 07/31/2016   HDL 67 07/31/2016   LDLCALC 101 (H) 07/31/2016   ALT 20 01/06/2021   AST 25 01/06/2021   NA 141 01/06/2021   K 3.9 01/06/2021   CL 106 01/06/2021   CREATININE 0.80 01/06/2021   BUN 14 01/06/2021   CO2 26 01/06/2021   TSH 1.140 07/31/2016   HGBA1C 5.8 (H) 06/02/2020   Assessment/Plan:  Emily Downs is a 40 y.o. Black or African American [2] female with  has a past medical history of Allergy, Beta thalassemia trait, Beta thalassemia trait, Genital herpes, Gestational diabetes, HPV (human papilloma virus) anogenital infection, chlamydia infection, pre-eclampsia in prior pregnancy, currently pregnant, Hypertension, Iron deficiency anemia, and Menometrorrhagia.  Left knee pain Acute, mod pain with medial swelling and reduced ROM, I suspect acute medial meniscal tear, size unkown - for pain control, also refer sport med for joint u/s  Essential hypertension, benign BP Readings from Last 3 Encounters:  01/20/21 122/70  01/10/21 126/90  01/07/21 (!) 139/97   Stable, pt to  continue medical treatment amlodipine, toprol   Hyperglycemia Very mild, asympt, likely reactive incidental - cont wt control, exercise, lower calorie diet  Followup: Return if symptoms worsen or fail to improve.  Cathlean Cower, MD 01/22/2021 1:20 PM [facility]

## 2021-01-20 NOTE — Patient Instructions (Signed)
Ok to take the Half percocet you have at home as you mentioned  You may have a medial left knee meniscus tear - please see Sports Medicine on the first floor  Please continue all other medications as before, and refills have been done if requested.  Please have the pharmacy call with any other refills you may need.  Please keep your appointments with your specialists as you may have planned

## 2021-01-22 ENCOUNTER — Encounter: Payer: Self-pay | Admitting: Internal Medicine

## 2021-01-22 DIAGNOSIS — R739 Hyperglycemia, unspecified: Secondary | ICD-10-CM | POA: Insufficient documentation

## 2021-01-22 NOTE — Assessment & Plan Note (Signed)
Very mild, asympt, likely reactive incidental - cont wt control, exercise, lower calorie diet

## 2021-01-22 NOTE — Assessment & Plan Note (Signed)
BP Readings from Last 3 Encounters:  01/20/21 122/70  01/10/21 126/90  01/07/21 (!) 139/97   Stable, pt to continue medical treatment amlodipine, toprol

## 2021-01-22 NOTE — Assessment & Plan Note (Signed)
Acute, mod pain with medial swelling and reduced ROM, I suspect acute medial meniscal tear, size unkown - for pain control, also refer sport med for joint u/s

## 2021-01-23 ENCOUNTER — Ambulatory Visit (INDEPENDENT_AMBULATORY_CARE_PROVIDER_SITE_OTHER): Payer: BC Managed Care – PPO | Admitting: Family Medicine

## 2021-01-23 ENCOUNTER — Other Ambulatory Visit: Payer: Self-pay

## 2021-01-23 ENCOUNTER — Encounter: Payer: Self-pay | Admitting: Family Medicine

## 2021-01-23 ENCOUNTER — Ambulatory Visit: Payer: Self-pay

## 2021-01-23 VITALS — BP 110/78 | HR 73 | Ht 60.0 in | Wt 165.4 lb

## 2021-01-23 DIAGNOSIS — M25562 Pain in left knee: Secondary | ICD-10-CM

## 2021-01-23 NOTE — Progress Notes (Signed)
I, Wendy Poet, LAT, ATC, am serving as scribe for Dr. Lynne Leader.  Subjective:    CC: L knee pain  HPI: Pt is a 40 y/o female c/o L medial knee pain ongoing since 01/18/21. MOI: Pt twisted her L knee and slipped and fell down the last 4 steps. She saw her PCP for these c/o on 01/20/21.  She was previously seen by Dr. Erlinda Hong on 06/07/20 for chronic bilat knee pain due to PF OA and completed 1 PT visit. Pt locates pain to her L medial knee. She never did impact her knee with a fall twisting her knee only.  Her knee is feeling a lot better today than it was last week.  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   Objective:    Vitals:   01/23/21 1321  BP: 110/78  Pulse: 73  SpO2: 99%   General: Well Developed, well nourished, and in no acute distress.   MSK: Left knee normal-appearing no effusion. Normal motion with crepitation. Tender palpation medial joint line. Stable ligamentous exam.  Small amount of pain felt with MCL stress test without laxity. Positive medial McMurray's test. Intact strength. Normal gait.  Lab and Radiology Results  Diagnostic Limited MSK Ultrasound of: Left knee Quad tendon intact normal-appearing Is significant joint effusion. Patellar tendon normal. Medial joint line narrowed with normal-appearing medial meniscus without visible tear. Lateral joint line normal. Posterior knee no Baker's cyst. Impression: Slight narrowed medial joint line with possible degenerative meniscus tear.    EXAM: LEFT KNEE - COMPLETE 4+ VIEW   COMPARISON:  No prior.   FINDINGS: No evidence of effusion. Corticated bony density noted  along the superolateral aspect of the left patella. This could be related bipartite patella or old fracture. No acute bony or joint abnormality identified.   IMPRESSION: Corticated bony density noted along the superolateral aspect of the left patella. This could be related to bipartite patella or old fracture. No acute abnormality identified.     Electronically Signed   By: Marcello Moores  Register   On: 06/01/2020 15:19   I, Lynne Leader, personally (independently) visualized and performed the interpretation of the images attached in this note.    Impression and Recommendations:    Assessment and Plan: 40 y.o. female with left knee pain after a twisting knee injury with a fall.  Physical exam is concerning for exacerbation of existing knee DJD or degenerative meniscus tear.  Fortunately she does not have severe mechanical symptoms and is improving.  Discussed conservative management strategies including oral NSAID dosing safety, topical Voltaren gel, hinged knee brace and home exercise program.  Plan for conservative management for the next month and if not improving would consider steroid injection as next step.  Eventually would proceed to MRI to further evaluate cause of knee pain.Marland Kitchen  PDMP not reviewed this encounter. Orders Placed This Encounter  Procedures   [place] LIMITED JOINT SPACE STRUCTURES LOW LEFT(NO LINKED CHARGES)    Order Specific Question:   Reason for Exam (SYMPTOM  OR DIAGNOSIS REQUIRED)    Answer:   L knee pain    Order Specific Question:   Preferred imaging location?  Answer:   [facility]   No orders of the defined types were placed in this encounter.   Discussed warning signs or symptoms. Please see discharge instructions. Patient expresses understanding.   The above documentation has been reviewed and is accurate and complete Lynne Leader, M.D.

## 2021-01-23 NOTE — Patient Instructions (Addendum)
Nice to meet you today.  Please use a hinged knee brace.  Please use Voltaren gel (Generic Diclofenac Gel) up to 4x daily for pain as needed.  This is available over-the-counter as both the name brand Voltaren gel and the generic diclofenac gel.   Follow-up: one month if needed and we can do an injection

## 2021-01-31 ENCOUNTER — Other Ambulatory Visit: Payer: BC Managed Care – PPO

## 2021-02-06 ENCOUNTER — Ambulatory Visit
Admission: RE | Admit: 2021-02-06 | Discharge: 2021-02-06 | Disposition: A | Discharge status: Home / Self Care | Payer: BC Managed Care – PPO | Source: Ambulatory Visit | Attending: Internal Medicine | Admitting: Internal Medicine

## 2021-02-06 DIAGNOSIS — G4489 Other headache syndrome: Secondary | ICD-10-CM

## 2021-02-06 DIAGNOSIS — R519 Headache, unspecified: Secondary | ICD-10-CM | POA: Diagnosis not present

## 2021-02-08 DIAGNOSIS — R040 Epistaxis: Secondary | ICD-10-CM | POA: Diagnosis not present

## 2021-02-21 ENCOUNTER — Ambulatory Visit (INDEPENDENT_AMBULATORY_CARE_PROVIDER_SITE_OTHER): Payer: BC Managed Care – PPO | Admitting: Family Medicine

## 2021-02-21 ENCOUNTER — Other Ambulatory Visit: Payer: Self-pay

## 2021-02-21 VITALS — BP 132/88 | HR 71 | Ht 60.0 in | Wt 166.2 lb

## 2021-02-21 DIAGNOSIS — M25551 Pain in right hip: Secondary | ICD-10-CM | POA: Diagnosis not present

## 2021-02-21 DIAGNOSIS — M25562 Pain in left knee: Secondary | ICD-10-CM | POA: Diagnosis not present

## 2021-02-21 DIAGNOSIS — G8929 Other chronic pain: Secondary | ICD-10-CM | POA: Diagnosis not present

## 2021-02-21 NOTE — Patient Instructions (Addendum)
Thank you for coming in today.   I've referred you to Physical Therapy.  Let us know if you don't hear from them in one week.   Call back or send a MyChart message in 6-8 weeks if not better and will proceed to MRI

## 2021-02-21 NOTE — Progress Notes (Signed)
I, Emily Downs, LAT, ATC acting as a scribe for Emily Leader, MD.  BRISEIDA Emily Downs is a 40 y.o. female who presents to [place] at [facility] today for f/u L knee pain. MOI: On 10/26, pt twisted her L knee and slipped and fell down the last 4 steps. Pt was last seen by Dr. Georgina Snell on 01/23/21 and was advised to use oral NSAIDs, Voltaren gel, hinged knee brace, and HEP. Today, pt reports L knee is still hurting, but slightly better than at her last visit. Pt reports varying pain depending on the movement. Pt is interested in pursing PT and not an injection.  She also notes some right lateral hip pain.  This has been ongoing now for a few months.  Pain occurs intermittently especially when standing from a seated position and prolonged standing.  Pain is located at the lateral iliac crest.  Dx imaging: 06/01/20 R & L knee XR  Pertinent review of systems: No fevers or chills  Relevant historical information: Hypertension   Exam:  BP 132/88   Pulse 71   Ht 5' (1.524 m)   Wt 166 lb 3.2 oz (75.4 kg)   SpO2 99%   BMI 32.46 kg/m  General: Well Developed, well nourished, and in no acute distress.   MSK: Left knee normal-appearing Normal motion.  Tender palpation medial to patella. Stable given his exam.  Intact strength.  Right hip normal-appearing normal motion.  Tender palpation right lateral iliac crest. Hip abduction strength diminished 4/5.  External rotation strength 4/5.  Pain with resisted hip abduction.    Lab and Radiology Results  EXAM: LEFT KNEE - COMPLETE 4+ VIEW   COMPARISON:  No prior.   FINDINGS: No evidence of effusion. Corticated bony density noted along the superolateral aspect of the left patella. This could be related bipartite patella or old fracture. No acute bony or joint abnormality identified.   IMPRESSION: Corticated bony density noted along the superolateral aspect of the left patella. This could be related to bipartite  patella or old fracture. No acute abnormality identified.     Electronically Signed   By: Marcello Moores  Register   On: 06/01/2020 15:19 I, Emily Downs, personally (independently) visualized and performed the interpretation of the images attached in this note.     Assessment and Plan: 40 y.o. female with left knee pain due to very likely patellofemoral pain syndrome at this point.  Patient is a good candidate for physical therapy.  Plan to refer to PT and if not improved in 6-week mark probably would proceed to MRI.  Patient will send me a message or call me in about 6 weeks.  If not improved likely proceed to MRI.  Lateral hip pain is hip abductor tendinopathy at origin iliac crest.  Again physical therapy will be helpful.  Plan to refer to PT.   PDMP not reviewed this encounter. Orders Placed This Encounter  Procedures   Ambulatory referral to Physical Therapy    Referral Priority:   Routine    Referral Type:   Physical Medicine    Referral Reason:   Specialty Services Required    Requested Specialty:   Physical Therapy    Number of Visits Requested:   1   No orders of the defined types were placed in this encounter.    Discussed warning signs or symptoms. Please see discharge instructions. Patient expresses understanding.   The above documentation has been reviewed and is accurate and complete Emily Downs, M.D.

## 2021-03-08 ENCOUNTER — Other Ambulatory Visit: Payer: Self-pay | Admitting: Emergency Medicine

## 2021-03-13 NOTE — Therapy (Signed)
OUTPATIENT PHYSICAL THERAPY EVALUATION   Patient Name: Emily Downs MRN: [id number] DOB:Feb 01, 1981, 40 y.o., female Today's Date: 03/16/2021   PT End of Session - 03/16/21 1409     Visit Number 1    Number of Visits 6    Date for PT Re-Evaluation 04/27/21    Authorization Type [employer] / [employer]    PT Start Time 1400    PT Stop Time 1445    PT Time Calculation (min) 45 min    Activity Tolerance Patient tolerated treatment well    Behavior During Therapy WFL for tasks assessed/performed             Past Medical History:  Diagnosis Date   Allergy    Beta thalassemia trait    Beta thalassemia trait    Genital herpes    Gestational diabetes    HPV (human papilloma virus) anogenital infection    Hx of chlamydia infection    Hx of pre-eclampsia in prior pregnancy, currently pregnant    Hypertension    Iron deficiency anemia    Menometrorrhagia    Past Surgical History:  Procedure Laterality Date   CESAREAN SECTION N/A 07/23/2019   Procedure: CESAREAN SECTION;  Surgeon: Jerelyn Charles, MD;  Location: [facility];  Service: Obstetrics;  Laterality: N/A;   COLPOSCOPY     DILATION AND EVACUATION  2009   DILATION AND EVACUATION N/A 11/08/2015   Procedure: DILATATION AND EVACUATION WITH CHROMASOME STUDIES;  Surgeon: Servando Salina, MD;  Location: [facility];  Service: Gynecology;  Laterality: N/A;   Patient Active Problem List   Diagnosis Date Noted   Hyperglycemia 01/22/2021   Left knee pain 01/20/2021   Other headache syndrome 01/13/2021   Nosebleed 01/13/2021   S/P cesarean section 07/26/2019   Benign essential hypertension antepartum in third trimester 07/23/2019   Near syncope 06/27/2019   Dehydration during pregnancy 06/27/2019   Essential hypertension, benign 12/11/2013   Menometrorrhagia    Iron deficiency anemia     PCP: Horald Pollen, MD  REFERRING PROVIDER: Gregor Hams, MD  REFERRING DIAG: Left medial knee pain, Chronic right hip  pain  THERAPY DIAG:  Chronic pain of left knee  Pain in right hip  Muscle weakness (generalized)  ONSET DATE: ongoing for many years, exacerbated 01/18/2021  SUBJECTIVE:   SUBJECTIVE STATEMENT: Patient reports she slipped on the stairs in mid October and injured left knee, she was told it looks like she tore her meniscus. She did have previous bilateral knee pain. She states the pain has gotten better since injury but has not gone away. The current pain is on the inside of the left knee. Patient also notes chronic bilateral hip pain, right worse than left. Has been ongoing for months. 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 Patient does have clicking and popping but states she had that prior to the recent injury.  PERTINENT HISTORY: N/A  PAIN:  Are you having pain? Yes VAS scale: 5/10 knee, 0/10 hip Pain location: left knee, right hip Pain orientation: Right and Left  PAIN TYPE: Chronic Pain description:  Chronic   Aggravating factors: laying in bed a certain way or trying to roll over, stairs, sitting extended periods Relieving factors: voltaren, rest, medication  PRECAUTIONS: None  WEIGHT BEARING RESTRICTIONS No  FALLS:  Has patient fallen  in last 6 months? Yes, slipped on stairs; Number of falls: 1  LIVING ENVIRONMENT: Lives with: lives with their family Lives in: House/apartment Stairs: Yes; Internal: 14-16 steps; Rail on left going up  OCCUPATION: Hair stylist  PLOF: Independent  PATIENT GOALS: Pain relief to get back to working out and playing with kids   OBJECTIVE:   DIAGNOSTIC FINDINGS:   X-ray: negative  PATIENT SURVEYS:  LEFS 50/80  COGNITION:  Overall cognitive status: Within functional limits for tasks assessed     SENSATION:  Light touch: Appears intact  MUSCLE LENGTH: Hamstrings: limited R > L Eli test:  negative  PALPATION: Mildly tender medial patella, no tenderness noted medial joint line  LE AROM/PROM:  AROM Right 03/16/2021 Left 03/16/2021  Knee flexion 130 130  Knee extension 0 0   (Blank rows = not tested)  LE MMT:  MMT Right 03/16/2021 Left 03/16/2021  Hip flexion 4 4  Hip extension 4- 4-  Hip abduction 4- 4-  Knee flexion 5 5  Knee extension 5 5   (Blank rows = not tested)  FUNCTIONAL TESTS:  Squat: good form, patient does note 5/10 pain on medial aspect of left patella    GAIT: Assistive device utilized: None Level of assistance: Complete Independence Comments: Trendelenburg bilaterally   TODAY'S TREATMENT: SLR x 10 Bridge 10 x 5 sec hold Sidelying hip abduction x 10 each Wall squat 10 x 5 sec hold SLS with eyes closed x 30 sec each   PATIENT EDUCATION:  Education details: Exam findings, POC, HEP Person educated: Patient Education method: Explanation, Demonstration, Tactile cues, Verbal cues, and Handouts Education comprehension: verbalized understanding, returned demonstration, verbal cues required, tactile cues required, and needs further education  HOME EXERCISE PROGRAM: Access Code: [email or web address]   ASSESSMENT: CLINICAL IMPRESSION: Patient is a 40 y.o. female who was seen today for physical therapy evaluation and treatment for acute on chronic left knee pain and chronic right hip pain. Knee pain currently seems consistent with patellofemoral pain vs meniscus injury, while patient did not report any reproduction of hip pain this visit. Objective impairments include Abnormal gait, decreased activity tolerance, difficulty walking, decreased ROM, decreased strength, impaired flexibility, improper body mechanics, and pain. These impairments are limiting patient from cleaning, community activity, meal prep, occupation, laundry, yard work, and shopping. Personal factors including Past/current experiences and Time since onset of injury/illness/exacerbation are  also affecting patient's functional outcome. Patient will benefit from skilled PT to address above impairments and improve overall function.  REHAB POTENTIAL: Good  CLINICAL DECISION MAKING: Stable/uncomplicated  EVALUATION COMPLEXITY: Low   GOALS: Goals reviewed with patient? Yes  SHORT TERM GOALS:  STG Name Target Date Goal status  1 Patient will be I with initial HEP in order to progress with therapy. Baseline: HEP provided at eval 04/06/2021 INITIAL  2 Patient will report pain level </= 5/10 with standing 1 hour in order to reduce functional limitations at work. Baseline: 7-8/10 with standing 1 hour 04/06/2021 INITIAL  3 Patient will report no increase in pain with squatting to improve ability to play with children. Baseline: Patient reports 5/10 pain with squatting 04/06/2021 INITIAL   LONG TERM GOALS:   LTG Name Target Date Goal status  1 Patient will be I with final HEP to maintain progress from PT. Baseline: HEP provided at eval 04/27/2021 INITIAL  2 Patient will demonstrate improvement in strength of bilateral hips to >/= 4/5 MMT in order to improve standing tolerance and stair negotiation. Baseline: grossly 4-/5 MMT 04/27/2021  INITIAL  3 Patient will report >/= 70/80 on LEFS to indicate improved functional ability. Baseline: 50/80 04/27/2021 INITIAL  4 Patient will report pain level </= 3/10 with standing 1 hour in order to improve work related tasks. Baseline: 7-8/10 pain level at eval 04/27/2021 INITIAL    PLAN: PT FREQUENCY: 1x/week  PT DURATION: 6 weeks  PLANNED INTERVENTIONS: Therapeutic exercises, Therapeutic activity, Neuro Muscular re-education, Balance training, Gait training, Patient/Family education, Joint mobilization, Stair training, Aquatic Therapy, Dry Needling, Electrical stimulation, Cryotherapy, Moist heat, Taping, Vasopneumatic device, and Manual therapy  PLAN FOR NEXT SESSION: Review HEP and progress PRN, progress hip and quad strengthening, squatting,  step ups, lunges, machine strengthening, balance training   Hilda Blades, PT, DPT, LAT, ATC 03/16/21  3:01 PM Phone: [phone] Fax: [phone]

## 2021-03-16 ENCOUNTER — Ambulatory Visit: Payer: BC Managed Care – PPO | Attending: Family Medicine | Admitting: Physical Therapy

## 2021-03-16 ENCOUNTER — Other Ambulatory Visit: Payer: Self-pay

## 2021-03-16 ENCOUNTER — Encounter: Payer: Self-pay | Admitting: Physical Therapy

## 2021-03-16 DIAGNOSIS — R2689 Other abnormalities of gait and mobility: Secondary | ICD-10-CM | POA: Diagnosis not present

## 2021-03-16 DIAGNOSIS — M25561 Pain in right knee: Secondary | ICD-10-CM | POA: Insufficient documentation

## 2021-03-16 DIAGNOSIS — M25551 Pain in right hip: Secondary | ICD-10-CM | POA: Insufficient documentation

## 2021-03-16 DIAGNOSIS — M25562 Pain in left knee: Secondary | ICD-10-CM | POA: Insufficient documentation

## 2021-03-16 DIAGNOSIS — G8929 Other chronic pain: Secondary | ICD-10-CM | POA: Diagnosis not present

## 2021-03-16 DIAGNOSIS — M6281 Muscle weakness (generalized): Secondary | ICD-10-CM | POA: Diagnosis not present

## 2021-03-16 NOTE — Patient Instructions (Signed)
Access Code: [email or web address] URL: [email or web address] Date: 03/16/2021 Prepared by: Hilda Blades  Exercises Active Straight Leg Raise with Quad Set - 1 x daily - 4 x weekly - 3 sets - 10 reps Supine Bridge - 1 x daily - 4 x weekly - 3 sets - 10 reps - 5 hold Sidelying Hip Abduction - 1 x daily - 4 x weekly - 3 sets - 10 reps Wall Squat - 1 x daily - 4 x weekly - 3 sets - 10 reps - 5 hold Single Leg Balance with Eyes Closed - 1 x daily - 4 x weekly - 3 sets - 30 hold

## 2021-03-21 ENCOUNTER — Other Ambulatory Visit: Payer: Self-pay

## 2021-03-21 ENCOUNTER — Encounter: Payer: Self-pay | Admitting: Physical Therapy

## 2021-03-21 ENCOUNTER — Ambulatory Visit: Payer: BC Managed Care – PPO | Admitting: Physical Therapy

## 2021-03-21 DIAGNOSIS — M25562 Pain in left knee: Secondary | ICD-10-CM

## 2021-03-21 DIAGNOSIS — M25561 Pain in right knee: Secondary | ICD-10-CM | POA: Diagnosis not present

## 2021-03-21 DIAGNOSIS — M6281 Muscle weakness (generalized): Secondary | ICD-10-CM

## 2021-03-21 DIAGNOSIS — G8929 Other chronic pain: Secondary | ICD-10-CM | POA: Diagnosis not present

## 2021-03-21 DIAGNOSIS — M25551 Pain in right hip: Secondary | ICD-10-CM | POA: Diagnosis not present

## 2021-03-21 DIAGNOSIS — R2689 Other abnormalities of gait and mobility: Secondary | ICD-10-CM | POA: Diagnosis not present

## 2021-03-21 NOTE — Therapy (Signed)
OUTPATIENT PHYSICAL THERAPY TREATMENT NOTE   Patient Name: Emily Downs MRN: [id number] DOB:Apr 27, 1980, 40 y.o., female Today's Date: 03/21/2021  PCP: Horald Pollen, MD REFERRING PROVIDER: Dr. Lynne Leader   PT End of Session - 03/21/21 0906     Visit Number 2    Number of Visits 6    Date for PT Re-Evaluation 04/27/21    Authorization Type BCBS / [employer]    PT Start Time [phone]    PT Stop Time 0930    PT Time Calculation (min) 44 min    Activity Tolerance Patient tolerated treatment well    Behavior During Therapy [facility] for tasks assessed/performed            Past Medical History:  Diagnosis Date   Allergy    Beta thalassemia trait    Beta thalassemia trait    Genital herpes    Gestational diabetes    HPV (human papilloma virus) anogenital infection    Hx of chlamydia infection    Hx of pre-eclampsia in prior pregnancy, currently pregnant    Hypertension    Iron deficiency anemia    Menometrorrhagia    Past Surgical History:  Procedure Laterality Date   CESAREAN SECTION N/A 07/23/2019   Procedure: CESAREAN SECTION;  Surgeon: Jerelyn Charles, MD;  Location: [facility];  Service: Obstetrics;  Laterality: N/A;   COLPOSCOPY     DILATION AND EVACUATION  2009   DILATION AND EVACUATION N/A 11/08/2015   Procedure: DILATATION AND EVACUATION WITH CHROMASOME STUDIES;  Surgeon: Servando Salina, MD;  Location: [facility] [facility];  Service: Gynecology;  Laterality: N/A;   Patient Active Problem List   Diagnosis Date Noted   Hyperglycemia 01/22/2021   Left knee pain 01/20/2021   Other headache syndrome 01/13/2021   Nosebleed 01/13/2021   S/P cesarean section 07/26/2019   Benign essential hypertension antepartum in third trimester 07/23/2019   Near syncope 06/27/2019   Dehydration during pregnancy 06/27/2019   Essential hypertension, benign 12/11/2013   Menometrorrhagia    Iron deficiency anemia     REFERRING DIAG: Left medial knee pain, Chronic right hip pain    THERAPY DIAG:  Chronic pain of left knee   Pain in right hip   Muscle weakness (generalized)   ONSET DATE: ongoing for many years, exacerbated 01/18/2021    THERAPY DIAG:  Chronic pain of left knee  Pain in right hip  Muscle weakness (generalized)  Chronic pain of right knee  Other abnormalities of gait and mobility  PERTINENT HISTORY: None  PRECAUTIONS: None  SUBJECTIVE: This is the first time I have left the house since last week.  Been doing her stretching.    PAIN:  Are you having pain? Yes VAS scale: 2/10 Pain location: L knee  Pain orientation: Left and Medial  PAIN TYPE: aching and stiff Pain description: intermittent, aching, and stiff   Aggravating factors: Standing, activity  Relieving factors: rest   TODAY'S TREATMENT: Recumbent bike 5 min L2  SLR x 10 SLR for VMO x 10  Bridge 10 x 5 sec hold Then added props green loop x 10 and then ball squeeze x 10 Sidelying hip abduction x 10 each, 2 sets  SL clam green loop x 20  Squat x 10 , 2 x 5 lbs DB  Wall squat 10 x 5 sec hold  Used physio ball 3 sets x 10  SLS with eyes open on foam pad, static 30 sec   Then added 5 lbs "rainbow" for  stability challenge   SLS with hip abduction x 15    PATIENT EDUCATION:  Education details: HEP reinforcement, squat form  Person educated: Patient Education method: Consulting civil engineer, Demonstration, Verbal cues, and Handouts Education comprehension: verbalized understanding, returned demonstration   HOME EXERCISE PROGRAM: Access Code: [email or web address]     ASSESSMENT:   CLINICAL IMPRESSION:  Patient with good tolerance for exercises, min pain increase with SL clam exercises.  Needs min cues for unlocking knees in single leg stance.  Advised ice after exercises if needed today.    PLAN: PT FREQUENCY: 1x/week   PT DURATION: 6 weeks   PLANNED INTERVENTIONS: Therapeutic exercises, Therapeutic activity, Neuro Muscular re-education, Balance training, Gait training,  Patient/Family education, Joint mobilization, Stair training, Aquatic Therapy, Dry Needling, Electrical stimulation, Cryotherapy, Moist heat, Taping, Vasopneumatic device, and Manual therapy   PLAN FOR NEXT SESSION: Review HEP and progress PRN, progress hip and quad strengthening, squatting, step ups, lunges, machine strengthening, balance training  Autym Siess 03/21/2021, 9:08 AM  Raeford Razor, PT 03/21/21 9:30 AM Phone: [phone] Fax: [phone]

## 2021-03-28 NOTE — Therapy (Signed)
OUTPATIENT PHYSICAL THERAPY TREATMENT NOTE   Patient Name: Emily Downs MRN: [id number] DOB:1980-10-02, 41 y.o., female Today's Date: 03/29/2021  PCP: Horald Pollen, MD REFERRING PROVIDER: Dr. Lynne Leader   PT End of Session - 03/29/21 1321     Visit Number 3    Number of Visits 6    Date for PT Re-Evaluation 04/27/21    Authorization Type [employer]BS / [employer]    PT Start Time 1321   patient late   PT Stop Time 1400    PT Time Calculation (min) 39 min    Activity Tolerance Patient tolerated treatment well    Behavior During Therapy WFL for tasks assessed/performed             Past Medical History:  Diagnosis Date   Allergy    Beta thalassemia trait    Beta thalassemia trait    Genital herpes    Gestational diabetes    HPV (human papilloma virus) anogenital infection    Hx of chlamydia infection    Hx of pre-eclampsia in prior pregnancy, currently pregnant    Hypertension    Iron deficiency anemia    Menometrorrhagia    Past Surgical History:  Procedure Laterality Date   CESAREAN SECTION N/A 07/23/2019   Procedure: CESAREAN SECTION;  Surgeon: Jerelyn Charles, MD;  Location: [facility];  Service: Obstetrics;  Laterality: N/A;   COLPOSCOPY     DILATION AND EVACUATION  2009   DILATION AND EVACUATION N/A 11/08/2015   Procedure: DILATATION AND EVACUATION WITH CHROMASOME STUDIES;  Surgeon: Servando Salina, MD;  Location: [facility];  Service: Gynecology;  Laterality: N/A;   Patient Active Problem List   Diagnosis Date Noted   Hyperglycemia 01/22/2021   Left knee pain 01/20/2021   Other headache syndrome 01/13/2021   Nosebleed 01/13/2021   S/P cesarean section 07/26/2019   Benign essential hypertension antepartum in third trimester 07/23/2019   Near syncope 06/27/2019   Dehydration during pregnancy 06/27/2019   Essential hypertension, benign 12/11/2013   Menometrorrhagia    Iron deficiency anemia     REFERRING DIAG: Left medial knee pain, Chronic  right hip pain   THERAPY DIAG:  Chronic pain of left knee   Pain in right hip   Muscle weakness (generalized)   ONSET DATE: ongoing for many years, exacerbated 01/18/2021    THERAPY DIAG:  Chronic pain of left knee  Pain in right hip  Muscle weakness (generalized)  PERTINENT HISTORY: None  PRECAUTIONS: None  SUBJECTIVE: Patient reports the knee is ok, but she had a really bad cramp in her calf yesterday that woke her up because it hurt so bad. She reports intermittent compliance with HEP.   PAIN:  Are you having pain? Yes VAS scale: 2/10 Pain location: Bilateral knees  Pain orientation: anterior PAIN TYPE: sharp Pain description: intermittent  Aggravating factors: weather, stairs Relieving factors: rest   OBJECTIVE:   *Unless noted objective measures were obtained on initial evaluation* DIAGNOSTIC FINDINGS:            X-ray: negative   PATIENT SURVEYS:  LEFS 50/80   COGNITION:          Overall cognitive status: Within functional limits for tasks assessed                        SENSATION:          Light touch: Appears intact   MUSCLE LENGTH: Hamstrings: limited R > L Eli  test: negative   PALPATION: Mildly tender medial patella, no tenderness noted medial joint line   LE AROM/PROM:   AROM Right 03/16/2021 Left 03/16/2021  Knee flexion 130 130  Knee extension 0 0   (Blank rows = not tested)   LE MMT:   MMT Right 03/16/2021 Left 03/16/2021  Hip flexion 4 4  Hip extension 4- 4-  Hip abduction 4- 4-  Knee flexion 5 5  Knee extension 5 5   (Blank rows = not tested)   FUNCTIONAL TESTS:  Squat: good form, patient does note 5/10 pain on medial aspect of left patella     GAIT: Assistive device utilized: None Level of assistance: Complete Independence Comments: Trendelenburg bilaterally     TODAY'S TREATMENT: TREATMENT 03/29/21  Therapeutic Exercise: - Recumbent bike 5 minutes level 3  - HS stretch with strap 2 x 30 sec each  - quad  stretch with strap 2 x 30 sec each  - sidelying hip circles CW/CCW 2 x 10 each, bilaterally  - squats to mat 2 x 10 - SL leg press 2 x 10 @ 40 lbs  - resisted knee extension 2 x 10 @ 25 lbs   Manual Therapy: - N/A  Neuromuscular re-ed: - SLS eyes closed 3 trials each, 5 seconds   Therapeutic Activity: - N/A  Self-care/Home Management: - N/A   03/21/21 Recumbent bike 5 min L2  SLR x 10 SLR for VMO x 10  Bridge 10 x 5 sec hold Then added props green loop x 10 and then ball squeeze x 10 Sidelying hip abduction x 10 each, 2 sets  SL clam green loop x 20  Squat x 10 , 2 x 5 lbs DB  Wall squat 10 x 5 sec hold             Used physio ball 3 sets x 10  SLS with eyes open on foam pad, static 30 sec              Then added 5 lbs "rainbow" for stability challenge    SLS with hip abduction x 15    PATIENT EDUCATION:  Education details: N/A Person educated: N/A Education method: N/A Education comprehension: N/A   HOME EXERCISE PROGRAM: Access Code: [email or web address]     ASSESSMENT: CLINICAL IMPRESSION: Patient tolerated session well today with progression of hip and knee strengthening. She quickly fatigues with hip strengthening, though able to maintain good form for all prescribed reps without increased knee pain. Occasional gluteal cramping with hip strengthening that quickly subsides with rest. She initially demonstrates excessive anterior tibial translation with body weight squats, though is able to easily correct with cueing. Audible crepitus noted during bodyweight squat descent, though patient denies pain with this movement.    REHAB POTENTIAL: Good   CLINICAL DECISION MAKING: Stable/uncomplicated   EVALUATION COMPLEXITY: Low     GOALS: Goals reviewed with patient? Yes   SHORT TERM GOALS:   STG Name Target Date Goal status  1 Patient will be I with initial HEP in order to progress with therapy. Baseline: HEP provided at eval 04/06/2021 INITIAL  2 Patient will report pain  level </= 5/10 with standing 1 hour in order to reduce functional limitations at work. Baseline: 7-8/10 with standing 1 hour 04/06/2021 INITIAL  3 Patient will report no increase in pain with squatting to improve ability to play with children. Baseline: Patient reports 5/10 pain with squatting 04/06/2021 INITIAL    LONG TERM GOALS:    LTG  Name Target Date Goal status  1 Patient will be I with final HEP to maintain progress from PT. Baseline: HEP provided at eval 04/27/2021 INITIAL  2 Patient will demonstrate improvement in strength of bilateral hips to >/= 4/5 MMT in order to improve standing tolerance and stair negotiation. Baseline: grossly 4-/5 MMT 04/27/2021 INITIAL  3 Patient will report >/= 70/80 on LEFS to indicate improved functional ability. Baseline: 50/80 04/27/2021 INITIAL  4 Patient will report pain level </= 3/10 with standing 1 hour in order to improve work related tasks. Baseline: 7-8/10 pain level at eval 04/27/2021 INITIAL      PLAN: PT FREQUENCY: 1x/week   PT DURATION: 6 weeks   PLANNED INTERVENTIONS: Therapeutic exercises, Therapeutic activity, Neuro Muscular re-education, Balance training, Gait training, Patient/Family education, Joint mobilization, Stair training, Aquatic Therapy, Dry Needling, Electrical stimulation, Cryotherapy, Moist heat, Taping, Vasopneumatic device, and Manual therapy   PLAN FOR NEXT SESSION: progress HEP, progress hip and quad strengthening, squatting, step ups, lunges, machine strengthening, balance training Gwendolyn Grant, PT, DPT, ATC 03/29/21 2:01 PM

## 2021-03-29 ENCOUNTER — Ambulatory Visit: Payer: 59 | Attending: Family Medicine

## 2021-03-29 ENCOUNTER — Other Ambulatory Visit: Payer: Self-pay

## 2021-03-29 DIAGNOSIS — M25562 Pain in left knee: Secondary | ICD-10-CM | POA: Diagnosis not present

## 2021-03-29 DIAGNOSIS — M6281 Muscle weakness (generalized): Secondary | ICD-10-CM | POA: Insufficient documentation

## 2021-03-29 DIAGNOSIS — M25551 Pain in right hip: Secondary | ICD-10-CM | POA: Insufficient documentation

## 2021-03-29 DIAGNOSIS — G8929 Other chronic pain: Secondary | ICD-10-CM | POA: Insufficient documentation

## 2021-04-04 ENCOUNTER — Encounter: Payer: Self-pay | Admitting: Physical Therapy

## 2021-04-04 ENCOUNTER — Other Ambulatory Visit: Payer: Self-pay

## 2021-04-04 ENCOUNTER — Ambulatory Visit: Payer: 59 | Admitting: Physical Therapy

## 2021-04-04 DIAGNOSIS — M6281 Muscle weakness (generalized): Secondary | ICD-10-CM

## 2021-04-04 DIAGNOSIS — M25562 Pain in left knee: Secondary | ICD-10-CM | POA: Diagnosis not present

## 2021-04-04 DIAGNOSIS — M25551 Pain in right hip: Secondary | ICD-10-CM

## 2021-04-04 DIAGNOSIS — G8929 Other chronic pain: Secondary | ICD-10-CM

## 2021-04-04 NOTE — Patient Instructions (Signed)
Access Code: [email or web address] URL: [email or web address] Date: 04/04/2021 Prepared by: Hilda Blades  Exercises Goblet Squat with Kettlebell - 1 x daily - 7 x weekly - 3 sets - 10 reps Step Up - 1 x daily - 7 x weekly - 3 sets - 10 reps Side Stepping with Resistance at Ankles - 1 x daily - 7 x weekly - 3 sets - 20 reps Single Leg Cone Touch - 1 x daily - 7 x weekly - 3 sets - 10 reps

## 2021-04-04 NOTE — Therapy (Signed)
OUTPATIENT PHYSICAL THERAPY TREATMENT NOTE   Patient Name: Emily Downs MRN: [id number] DOB:January 29, 1981, 41 y.o., female Today's Date: 04/04/2021  PCP: Horald Pollen, MD REFERRING PROVIDER: Dr. Lynne Leader   PT End of Session - 04/04/21 1402     Visit Number 4    Number of Visits 6    Date for PT Re-Evaluation 04/27/21    Authorization Type [employer] / [employer]    PT Start Time 1400    PT Stop Time 1442    PT Time Calculation (min) 42 min    Activity Tolerance Patient tolerated treatment well    Behavior During Therapy WFL for tasks assessed/performed              Past Medical History:  Diagnosis Date   Allergy    Beta thalassemia trait    Beta thalassemia trait    Genital herpes    Gestational diabetes    HPV (human papilloma virus) anogenital infection    Hx of chlamydia infection    Hx of pre-eclampsia in prior pregnancy, currently pregnant    Hypertension    Iron deficiency anemia    Menometrorrhagia    Past Surgical History:  Procedure Laterality Date   CESAREAN SECTION N/A 07/23/2019   Procedure: CESAREAN SECTION;  Surgeon: Jerelyn Charles, MD;  Location: [facility];  Service: Obstetrics;  Laterality: N/A;   COLPOSCOPY     DILATION AND EVACUATION  2009   DILATION AND EVACUATION N/A 11/08/2015   Procedure: DILATATION AND EVACUATION WITH CHROMASOME STUDIES;  Surgeon: Servando Salina, MD;  Location: [facility];  Service: Gynecology;  Laterality: N/A;   Patient Active Problem List   Diagnosis Date Noted   Hyperglycemia 01/22/2021   Left knee pain 01/20/2021   Other headache syndrome 01/13/2021   Nosebleed 01/13/2021   S/P cesarean section 07/26/2019   Benign essential hypertension antepartum in third trimester 07/23/2019   Near syncope 06/27/2019   Dehydration during pregnancy 06/27/2019   Essential hypertension, benign 12/11/2013   Menometrorrhagia    Iron deficiency anemia     REFERRING DIAG: Left medial knee pain, Chronic right hip  pain   THERAPY DIAG:  Chronic pain of left knee   Pain in right hip   Muscle weakness (generalized)   ONSET DATE: ongoing for many years, exacerbated 01/18/2021    THERAPY DIAG:  Chronic pain of left knee  Pain in right hip  Muscle weakness (generalized)  PERTINENT HISTORY: None  PRECAUTIONS: None   SUBJECTIVE: Patient reports the part of the knees she is here for is doing better, but her arthritis is starting to bother her more.   PAIN:  Are you having pain? Yes VAS scale: 3/10 Pain location: Bilateral knees  Pain orientation: Anterior / lateral PAIN TYPE: Chronic Pain description: intermittent, achy Aggravating factors: Weather, stairs Relieving factors: Rest    OBJECTIVE: (BOLDED MEASURES ASSESSED THIS VISIT)  PATIENT SURVEYS:  LEFS 71/80   MUSCLE LENGTH: Hamstrings: limited R > L Eli test: negative   LE AROM/PROM:   AROM Right 03/16/2021 Left 03/16/2021  Knee flexion 130 130  Knee extension 0 0   LE MMT:   MMT Right 03/16/2021 Left 03/16/2021  Hip flexion 4 4  Hip extension 4- 4-  Hip abduction 4- 4-  Knee flexion 5 5  Knee extension 5 5   FUNCTIONAL TESTS:  Squat: good form and depth, no increased pain noted SLS with EO 30+ sec each     TODAY'S TREATMENT: 04/04/2021 Recumbent  bike L3 x 5 min while taking subjective SLR x 10 each Bridge x 10 Squat to chair tap x 10 without UE support Goblet squat with 10# 3 x 10 Step-up 8" holding bilat 5# 3 x 10 Lateral band walk with green at ankles 3 x 20 SLS with EO x 30 sec each SLS with forward cone tap using contralateral UE 2 x 10 each   03/29/2021 Therapeutic Exercise: - Recumbent bike 5 minutes level 3  - HS stretch with strap 2 x 30 sec each  - quad stretch with strap 2 x 30 sec each  - sidelying hip circles CW/CCW 2 x 10 each, bilaterally  - squats to mat 2 x 10 - SL leg press 2 x 10 @ 40 lbs  - resisted knee extension 2 x 10 @ 25 lbs  Neuromuscular re-ed: - SLS eyes closed 3  trials each, 5 seconds   03/21/2021 Recumbent bike 5 min L2  SLR x 10 SLR for VMO x 10  Bridge 10 x 5 sec hold Then added props green loop x 10 and then ball squeeze x 10 Sidelying hip abduction x 10 each, 2 sets  SL clam green loop x 20  Squat x 10 , 2 x 5 lbs DB  Wall squat 10 x 5 sec hold             Used physio ball 3 sets x 10  SLS with eyes open on foam pad, static 30 sec              Then added 5 lbs "rainbow" for stability challenge  SLS with hip abduction x 15    PATIENT EDUCATION:  Education details: LEFS, HEP update Person educated: Patient Education method: Explanation, Demonstration, Tactile cues, Verbal cues, and Handouts Education comprehension: verbalized understanding, returned demonstration, verbal cues required, tactile cues required, and needs further education   HOME EXERCISE PROGRAM: Access Code: [email or web address]     ASSESSMENT: CLINICAL IMPRESSION:  Patient tolerated therapy well with no adverse effects. Therapy focused primarily on progressing strength and SL control. Overall she seems to be progressing well with therapy and tolerated update to HEP to add resistance and have her performing strengthening exercises in standing positions. She demonstrates good squat form and SLS balance so incorporate dynamic balance. She reports great improvement in functional ability on LEFS this visit.Patient would benefit from continued skilled PT to progress her strength and control in order to reduce pain and maximize functional ability.     GOALS: Goals reviewed with patient? Yes   SHORT TERM GOALS:   STG Name Target Date Goal status  1 Patient will be I with initial HEP in order to progress with therapy. Baseline: independent with initial HEP 04/06/2021 ACHIEVED  2 Patient will report pain level </= 5/10 with standing 1 hour in order to reduce functional limitations at work. Baseline: patient reports knees just get tight when she stands, not really pain 04/06/2021 ACHIEVED   3 Patient will report no increase in pain with squatting to improve ability to play with children. Baseline: patient reports no increase pain with body weight squat 04/06/2021 ACHIEVED    LONG TERM GOALS:    LTG Name Target Date Goal status  1 Patient will be I with final HEP to maintain progress from PT. Baseline: HEP provided at eval 04/27/2021 INITIAL  2 Patient will demonstrate improvement in strength of bilateral hips to >/= 4/5 MMT in order to improve standing tolerance and stair negotiation. Baseline: grossly  4-/5 MMT 04/27/2021 INITIAL  3 Patient will report >/= 70/80 on LEFS to indicate improved functional ability. Baseline: 71/80 - 04/04/2021 04/27/2021 ACHIEVED  4 Patient will report pain level </= 3/10 with standing 1 hour in order to improve work related tasks. Baseline: 7-8/10 pain level at eval 04/27/2021 INITIAL      PLAN: PT FREQUENCY: 1x/week   PT DURATION: 6 weeks   PLANNED INTERVENTIONS: Therapeutic exercises, Therapeutic activity, Neuro Muscular re-education, Balance training, Gait training, Patient/Family education, Joint mobilization, Stair training, Aquatic Therapy, Dry Needling, Electrical stimulation, Cryotherapy, Moist heat, Taping, Vasopneumatic device, and Manual therapy   PLAN FOR NEXT SESSION: progress HEP, progress hip and quad strengthening, squatting, step ups, lunges, machine strengthening, balance training   Hilda Blades, PT, DPT, LAT, ATC 04/04/21  2:51 PM Phone: [phone] Fax: [phone]

## 2021-04-10 NOTE — Therapy (Signed)
OUTPATIENT PHYSICAL THERAPY TREATMENT NOTE   Patient Name: Emily Downs MRN: [id number] DOB:August 26, 1980, 41 y.o., female Today's Date: 04/11/2021  PCP: Horald Pollen, MD REFERRING PROVIDER: Dr. Lynne Leader   PT End of Session - 04/11/21 1442     Visit Number 5    Number of Visits 6    Date for PT Re-Evaluation 04/27/21    Authorization Type [employer] / [employer]    PT Start Time 1400    PT Stop Time 1442    PT Time Calculation (min) 42 min    Activity Tolerance Patient tolerated treatment well    Behavior During Therapy WFL for tasks assessed/performed               Past Medical History:  Diagnosis Date   Allergy    Beta thalassemia trait    Beta thalassemia trait    Genital herpes    Gestational diabetes    HPV (human papilloma virus) anogenital infection    Hx of chlamydia infection    Hx of pre-eclampsia in prior pregnancy, currently pregnant    Hypertension    Iron deficiency anemia    Menometrorrhagia    Past Surgical History:  Procedure Laterality Date   CESAREAN SECTION N/A 07/23/2019   Procedure: CESAREAN SECTION;  Surgeon: Jerelyn Charles, MD;  Location: [facility];  Service: Obstetrics;  Laterality: N/A;   COLPOSCOPY     DILATION AND EVACUATION  2009   DILATION AND EVACUATION N/A 11/08/2015   Procedure: DILATATION AND EVACUATION WITH CHROMASOME STUDIES;  Surgeon: Servando Salina, MD;  Location: [facility];  Service: Gynecology;  Laterality: N/A;   Patient Active Problem List   Diagnosis Date Noted   Hyperglycemia 01/22/2021   Left knee pain 01/20/2021   Other headache syndrome 01/13/2021   Nosebleed 01/13/2021   S/P cesarean section 07/26/2019   Benign essential hypertension antepartum in third trimester 07/23/2019   Near syncope 06/27/2019   Dehydration during pregnancy 06/27/2019   Essential hypertension, benign 12/11/2013   Menometrorrhagia    Iron deficiency anemia     REFERRING PROVIDER: Gregor Hams, MD  REFERRING DIAG:  Left medial knee pain, Chronic right hip pain   ONSET DATE: ongoing for many years, exacerbated 01/18/2021   THERAPY DIAG:  Chronic pain of left knee  Pain in right hip  Muscle weakness (generalized)  PERTINENT HISTORY: None  PRECAUTIONS: None   SUBJECTIVE: Patient reports she is doing well with no new issues.   PAIN:  Are you having pain? Yes VAS scale: 1/10 Pain location: Left knee Pain orientation: Lateral PAIN TYPE: Chronic Pain description: intermittent, achy Aggravating factors: Weather, stairs Relieving factors: Rest    OBJECTIVE: (BOLDED MEASURES ASSESSED THIS VISIT)  PATIENT SURVEYS:  LEFS 71/80 (04/04/2021)   MUSCLE LENGTH: Hamstrings: limited R > L Eli test: negative   LE AROM/PROM:   AROM Right 03/16/2021 Left 03/16/2021  Knee flexion 130 130  Knee extension 0 0   LE MMT:   MMT Right 03/16/2021 Left 03/16/2021 Right / Left 04/11/2021  Hip flexion 4 4   Hip extension 4- 4- 4 / 4  Hip abduction 4- 4- 4 / 4  Knee flexion 5 5   Knee extension 5 5    FUNCTIONAL TESTS:  Squat: good form and depth, no increased pain noted SLS with EO 30+ sec each     TODAY'S TREATMENT: 04/11/2021 Recumbent bike L3 x 4 min while taking subjective Leg press (cybex): 120# 3 x 10 SL  RDL with 10# 3 x 8 each Split squat with rear foot elevated holding 10# bilat 3 x 8 each Lateral band walk with blue at ankles 3 x 20 Bridge with hamstring curl using physioball 3 x 8 Knee extension machine (omega) 25# 3 x 10   04/04/2021 Recumbent bike L3 x 5 min while taking subjective SLR x 10 each Bridge x 10 Squat to chair tap x 10 without UE support Goblet squat with 10# 3 x 10 Step-up 8" holding bilat 5# 3 x 10 Lateral band walk with green at ankles 3 x 20 SLS with EO x 30 sec each SLS with forward cone tap using contralateral UE 2 x 10 each  03/29/2021 Therapeutic Exercise: - Recumbent bike 5 minutes level 3  - HS stretch with strap 2 x 30 sec each  - quad stretch  with strap 2 x 30 sec each  - sidelying hip circles CW/CCW 2 x 10 each, bilaterally  - squats to mat 2 x 10 - SL leg press 2 x 10 @ 40 lbs  - resisted knee extension 2 x 10 @ 25 lbs  Neuromuscular re-ed: - SLS eyes closed 3 trials each, 5 seconds   03/21/2021 Recumbent bike 5 min L2  SLR x 10 SLR for VMO x 10  Bridge 10 x 5 sec hold Then added props green loop x 10 and then ball squeeze x 10 Sidelying hip abduction x 10 each, 2 sets  SL clam green loop x 20  Squat x 10 , 2 x 5 lbs DB  Wall squat 10 x 5 sec hold             Used physio ball 3 sets x 10  SLS with eyes open on foam pad, static 30 sec              Then added 5 lbs "rainbow" for stability challenge  SLS with hip abduction x 15     PATIENT EDUCATION:  Education details: HEP update Person educated: Patient Education method: Explanation, Demonstration, Tactile cues, Verbal cues, and Handouts Education comprehension: verbalized understanding, returned demonstration, verbal cues required, tactile cues required, and needs further education   HOME EXERCISE PROGRAM: Access Code: [email or web address]     ASSESSMENT: CLINICAL IMPRESSION:  Patient tolerated therapy well with no adverse effects. Therapy focused on continued progression of strength and SL control. She is progressing well with therapy and reporting less pain. She did not report any increase in pain with therapy but does continue to note feeling unstable in single leg. Patient does exhibit improved hip strength this visit. No changes made to HEP, she was instructed to progress exercises by adding weight or more reps. Patient would benefit from continued skilled PT to progress her strength and control in order to reduce pain and maximize functional ability.     GOALS: Goals reviewed with patient? Yes   SHORT TERM GOALS:   STG Name Target Date Goal status  1 Patient will be I with initial HEP in order to progress with therapy. Baseline: independent with initial HEP  04/06/2021 ACHIEVED  2 Patient will report pain level </= 5/10 with standing 1 hour in order to reduce functional limitations at work. Baseline: patient reports knees just get tight when she stands, not really pain 04/06/2021 ACHIEVED  3 Patient will report no increase in pain with squatting to improve ability to play with children. Baseline: patient reports no increase pain with body weight squat 04/06/2021 ACHIEVED    LONG  TERM GOALS:    LTG Name Target Date Goal status  1 Patient will be I with final HEP to maintain progress from PT. Baseline: HEP provided at eval 04/27/2021 INITIAL  2 Patient will demonstrate improvement in strength of bilateral hips to >/= 4/5 MMT in order to improve standing tolerance and stair negotiation. Baseline: grossly 4-/5 MMT 04/27/2021 INITIAL  3 Patient will report >/= 70/80 on LEFS to indicate improved functional ability. Baseline: 71/80 - 04/04/2021 04/27/2021 ACHIEVED  4 Patient will report pain level </= 3/10 with standing 1 hour in order to improve work related tasks. Baseline: 7-8/10 pain level at eval 04/27/2021 INITIAL      PLAN: PT FREQUENCY: 1x/week   PT DURATION: 6 weeks   PLANNED INTERVENTIONS: Therapeutic exercises, Therapeutic activity, Neuro Muscular re-education, Balance training, Gait training, Patient/Family education, Joint mobilization, Stair training, Aquatic Therapy, Dry Needling, Electrical stimulation, Cryotherapy, Moist heat, Taping, Vasopneumatic device, and Manual therapy   PLAN FOR NEXT SESSION: progress HEP, progress hip and quad strengthening, squatting, step ups, lunges, machine strengthening, balance training   Hilda Blades, PT, DPT, LAT, ATC 04/11/21  2:43 PM Phone: [phone] Fax: [phone]

## 2021-04-11 ENCOUNTER — Other Ambulatory Visit: Payer: Self-pay

## 2021-04-11 ENCOUNTER — Ambulatory Visit: Payer: 59 | Admitting: Physical Therapy

## 2021-04-11 ENCOUNTER — Encounter: Payer: Self-pay | Admitting: Physical Therapy

## 2021-04-11 DIAGNOSIS — M25562 Pain in left knee: Secondary | ICD-10-CM

## 2021-04-11 DIAGNOSIS — M6281 Muscle weakness (generalized): Secondary | ICD-10-CM

## 2021-04-11 DIAGNOSIS — M25551 Pain in right hip: Secondary | ICD-10-CM

## 2021-04-11 DIAGNOSIS — G8929 Other chronic pain: Secondary | ICD-10-CM

## 2021-04-13 NOTE — Therapy (Incomplete)
OUTPATIENT PHYSICAL THERAPY TREATMENT NOTE   Patient Name: Emily Downs MRN: [id number] DOB:[birth year], 41 y.o., female Today's Date: 04/13/2021  PCP: Horald Pollen, MD REFERRING PROVIDER: Dr. Lynne Leader       Past Medical History:  Diagnosis Date   Allergy    Beta thalassemia trait    Beta thalassemia trait    Genital herpes    Gestational diabetes    HPV (human papilloma virus) anogenital infection    Hx of chlamydia infection    Hx of pre-eclampsia in prior pregnancy, currently pregnant    Hypertension    Iron deficiency anemia    Menometrorrhagia    Past Surgical History:  Procedure Laterality Date   CESAREAN SECTION N/A 07/23/2019   Procedure: CESAREAN SECTION;  Surgeon: Jerelyn Charles, MD;  Location: [facility];  Service: Obstetrics;  Laterality: N/A;   COLPOSCOPY     DILATION AND EVACUATION  2009   DILATION AND EVACUATION N/A 11/08/2015   Procedure: DILATATION AND EVACUATION WITH CHROMASOME STUDIES;  Surgeon: Servando Salina, MD;  Location: [facility];  Service: Gynecology;  Laterality: N/A;   Patient Active Problem List   Diagnosis Date Noted   Hyperglycemia 01/22/2021   Left knee pain 01/20/2021   Other headache syndrome 01/13/2021   Nosebleed 01/13/2021   S/P cesarean section 07/26/2019   Benign essential hypertension antepartum in third trimester 07/23/2019   Near syncope 06/27/2019   Dehydration during pregnancy 06/27/2019   Essential hypertension, benign 12/11/2013   Menometrorrhagia    Iron deficiency anemia     REFERRING PROVIDER: Gregor Hams, MD  REFERRING DIAG: Left medial knee pain, Chronic right hip pain   ONSET DATE: ongoing for many years, exacerbated 01/18/2021   THERAPY DIAG:  No diagnosis found.  PERTINENT HISTORY: None  PRECAUTIONS: None   SUBJECTIVE:   PAIN:  Are you having pain? Yes VAS scale: 1/10 Pain location: Left knee Pain orientation: Lateral PAIN TYPE: Chronic Pain description: intermittent,  achy Aggravating factors: Weather, stairs Relieving factors: Rest    OBJECTIVE: (BOLDED MEASURES ASSESSED THIS VISIT)  PATIENT SURVEYS:  LEFS 71/80 (04/04/2021)   MUSCLE LENGTH: Hamstrings: limited R > L Eli test: negative   LE AROM/PROM:   AROM Right 03/16/2021 Left 03/16/2021  Knee flexion 130 130  Knee extension 0 0   LE MMT:   MMT Right 03/16/2021 Left 03/16/2021 Right / Left 04/11/2021  Hip flexion 4 4   Hip extension 4- 4- 4 / 4  Hip abduction 4- 4- 4 / 4  Knee flexion 5 5   Knee extension 5 5    FUNCTIONAL TESTS:  Squat: good form and depth, no increased pain noted SLS with EO 30+ sec each     TODAY'S TREATMENT: [facility] Adult PT Treatment:                                                DATE: 04/17/21 Therapeutic Exercise: *** Manual Therapy: *** Neuromuscular re-ed: *** Therapeutic Activity: *** Modalities: *** Self Care: ***   04/11/2021 Recumbent bike L3 x 4 min while taking subjective Leg press (cybex): 120# 3 x 10 SL RDL with 10# 3 x 8 each Split squat with rear foot elevated holding 10# bilat 3 x 8 each Lateral band walk with blue at ankles 3 x 20 Bridge with hamstring curl using physioball 3 x 8 Knee  extension machine (omega) 25# 3 x 10   04/04/2021 Recumbent bike L3 x 5 min while taking subjective SLR x 10 each Bridge x 10 Squat to chair tap x 10 without UE support Goblet squat with 10# 3 x 10 Step-up 8" holding bilat 5# 3 x 10 Lateral band walk with green at ankles 3 x 20 SLS with EO x 30 sec each SLS with forward cone tap using contralateral UE 2 x 10 each  03/29/2021 Therapeutic Exercise: - Recumbent bike 5 minutes level 3  - HS stretch with strap 2 x 30 sec each  - quad stretch with strap 2 x 30 sec each  - sidelying hip circles CW/CCW 2 x 10 each, bilaterally  - squats to mat 2 x 10 - SL leg press 2 x 10 @ 40 lbs  - resisted knee extension 2 x 10 @ 25 lbs  Neuromuscular re-ed: - SLS eyes closed 3 trials each, 5 seconds    03/21/2021 Recumbent bike 5 min L2  SLR x 10 SLR for VMO x 10  Bridge 10 x 5 sec hold Then added props green loop x 10 and then ball squeeze x 10 Sidelying hip abduction x 10 each, 2 sets  SL clam green loop x 20  Squat x 10 , 2 x 5 lbs DB  Wall squat 10 x 5 sec hold             Used physio ball 3 sets x 10  SLS with eyes open on foam pad, static 30 sec              Then added 5 lbs "rainbow" for stability challenge  SLS with hip abduction x 15     PATIENT EDUCATION:  Education details: HEP update Person educated: Patient Education method: Explanation, Demonstration, Tactile cues, Verbal cues, and Handouts Education comprehension: verbalized understanding, returned demonstration, verbal cues required, tactile cues required, and needs further education   HOME EXERCISE PROGRAM: Access Code: [email or web address]     ASSESSMENT: CLINICAL IMPRESSION:      GOALS: Goals reviewed with patient? Yes   SHORT TERM GOALS:   STG Name Target Date Goal status  1 Patient will be I with initial HEP in order to progress with therapy. Baseline: independent with initial HEP 04/06/2021 ACHIEVED  2 Patient will report pain level </= 5/10 with standing 1 hour in order to reduce functional limitations at work. Baseline: patient reports knees just get tight when she stands, not really pain 04/06/2021 ACHIEVED  3 Patient will report no increase in pain with squatting to improve ability to play with children. Baseline: patient reports no increase pain with body weight squat 04/06/2021 ACHIEVED    LONG TERM GOALS:    LTG Name Target Date Goal status  1 Patient will be I with final HEP to maintain progress from PT. Baseline: HEP provided at eval 04/27/2021 INITIAL  2 Patient will demonstrate improvement in strength of bilateral hips to >/= 4/5 MMT in order to improve standing tolerance and stair negotiation. Baseline: grossly 4-/5 MMT 04/27/2021 INITIAL  3 Patient will report >/= 70/80 on LEFS to indicate  improved functional ability. Baseline: 71/80 - 04/04/2021 04/27/2021 ACHIEVED  4 Patient will report pain level </= 3/10 with standing 1 hour in order to improve work related tasks. Baseline: 7-8/10 pain level at eval 04/27/2021 INITIAL      PLAN: PT FREQUENCY: 1x/week   PT DURATION: 6 weeks   PLANNED INTERVENTIONS: Therapeutic exercises, Therapeutic activity, Neuro Muscular  re-education, Balance training, Gait training, Patient/Family education, Joint mobilization, Stair training, Aquatic Therapy, Dry Needling, Electrical stimulation, Cryotherapy, Moist heat, Taping, Vasopneumatic device, and Manual therapy   PLAN FOR NEXT SESSION: progress HEP, progress hip and quad strengthening, squatting, step ups, lunges, machine strengthening, balance training  Gwendolyn Grant, PT, DPT, ATC 04/13/21 9:52 AM

## 2021-04-14 ENCOUNTER — Telehealth: Payer: Self-pay | Admitting: Emergency Medicine

## 2021-04-14 NOTE — Telephone Encounter (Signed)
1.Medication Requested: amLODipine (NORVASC) 10 MG tablet  2. Pharmacy (Name, [facility]): [id number] - [place], [place]. AT [address]  Phone:  [phone] Fax:  [phone]   3. On Med List: yes  4. Last Visit with PCP: 03.09.22  5. Next visit date with PCP: n/a   Agent: Please be advised that RX refills may take up to 3 business days. We ask that you follow-up with your pharmacy.

## 2021-04-15 MED ORDER — AMLODIPINE BESYLATE 10 MG PO TABS: 10.0000 mg | ORAL_TABLET | Freq: Every day | ORAL | 1 refills | Status: DC

## 2021-04-17 ENCOUNTER — Ambulatory Visit: Payer: 59

## 2021-04-20 ENCOUNTER — Other Ambulatory Visit: Payer: Self-pay

## 2021-04-20 ENCOUNTER — Encounter: Payer: Self-pay | Admitting: Physical Therapy

## 2021-04-20 ENCOUNTER — Ambulatory Visit: Payer: 59 | Admitting: Physical Therapy

## 2021-04-20 DIAGNOSIS — M25551 Pain in right hip: Secondary | ICD-10-CM

## 2021-04-20 DIAGNOSIS — M25562 Pain in left knee: Secondary | ICD-10-CM | POA: Diagnosis not present

## 2021-04-20 DIAGNOSIS — M6281 Muscle weakness (generalized): Secondary | ICD-10-CM

## 2021-04-20 DIAGNOSIS — G8929 Other chronic pain: Secondary | ICD-10-CM

## 2021-04-20 NOTE — Patient Instructions (Signed)
Access Code: [email or web address] URL: [email or web address] Date: 04/20/2021 Prepared by: Hilda Blades  Exercises Goblet Squat with Kettlebell - 1 x daily - 7 x weekly - 3 sets - 10 reps Step Up - 1 x daily - 7 x weekly - 3 sets - 10 reps Side Stepping with Resistance at Ankles - 1 x daily - 7 x weekly - 3 sets - 20 reps Walking Forward Lunge - 1 x daily - 7 x weekly - 3 sets - 10 reps Single Leg Cone Touch - 1 x daily - 7 x weekly - 3 sets - 10 reps

## 2021-04-20 NOTE — Therapy (Signed)
OUTPATIENT PHYSICAL THERAPY TREATMENT NOTE  ERO   Patient Name: Emily Downs MRN: [id number] DOB:07-16-1980, 41 y.o., female Today's Date: 04/20/2021  PCP: Horald Pollen, MD REFERRING PROVIDER: Dr. Lynne Leader   PT End of Session - 04/20/21 1702     Visit Number 6    Number of Visits 7    Date for PT Re-Evaluation 04/27/21    Authorization Type [employer] / [employer]    PT Start Time 1700    PT Stop Time 2706    PT Time Calculation (min) 45 min    Activity Tolerance Patient tolerated treatment well    Behavior During Therapy WFL for tasks assessed/performed                Past Medical History:  Diagnosis Date   Allergy    Beta thalassemia trait    Beta thalassemia trait    Genital herpes    Gestational diabetes    HPV (human papilloma virus) anogenital infection    Hx of chlamydia infection    Hx of pre-eclampsia in prior pregnancy, currently pregnant    Hypertension    Iron deficiency anemia    Menometrorrhagia    Past Surgical History:  Procedure Laterality Date   CESAREAN SECTION N/A 07/23/2019   Procedure: CESAREAN SECTION;  Surgeon: Jerelyn Charles, MD;  Location: [facility];  Service: Obstetrics;  Laterality: N/A;   COLPOSCOPY     DILATION AND EVACUATION  2009   DILATION AND EVACUATION N/A 11/08/2015   Procedure: DILATATION AND EVACUATION WITH CHROMASOME STUDIES;  Surgeon: Servando Salina, MD;  Location: [facility];  Service: Gynecology;  Laterality: N/A;   Patient Active Problem List   Diagnosis Date Noted   Hyperglycemia 01/22/2021   Left knee pain 01/20/2021   Other headache syndrome 01/13/2021   Nosebleed 01/13/2021   S/P cesarean section 07/26/2019   Benign essential hypertension antepartum in third trimester 07/23/2019   Near syncope 06/27/2019   Dehydration during pregnancy 06/27/2019   Essential hypertension, benign 12/11/2013   Menometrorrhagia    Iron deficiency anemia     REFERRING PROVIDER: Gregor Hams,  MD  REFERRING DIAG: Left knee pain, Chronic right hip pain   ONSET DATE: ongoing for many years, exacerbated 01/18/2021   THERAPY DIAG:  Chronic pain of left knee  Pain in right hip  Muscle weakness (generalized)  PERTINENT HISTORY: None  PRECAUTIONS: None   SUBJECTIVE: Patient reports she is doing well, she had to do some running around at work and states she didn't have any increase in pain at the time but did have some soreness afterward.   PAIN:  Are you having pain? Yes VAS scale: 2/10 Pain location: Left knee Pain orientation: Lateral PAIN TYPE: Chronic Pain description: intermittent, achy Aggravating factors: Weather, stairs Relieving factors: Rest    OBJECTIVE: (BOLDED MEASURES ASSESSED THIS VISIT) PATIENT SURVEYS:  LEFS 71/80 (04/04/2021)   MUSCLE LENGTH: Hamstrings: limited R > L Eli test: negative   LE AROM/PROM:   AROM Right 03/16/2021 Left 03/16/2021  Knee flexion 130 130  Knee extension 0 0   LE MMT:   MMT Right 03/16/2021 Left 03/16/2021 Right / Left 04/11/2021 Right / Left 04/20/2021  Hip flexion 4 4    Hip extension 4- 4- 4 / 4 4 / 4  Hip abduction 4- 4- 4 / 4 4 / 4  Knee flexion 5 5    Knee extension 5 5     FUNCTIONAL TESTS:  Squat: good  form and depth, no increased pain noted SLS with EO 30+ sec each     TODAY'S TREATMENT: 04/20/2021 Recumbent bike L2 x 5 min while taking subjective SL bridge in figure-4 position x 10 each Sidelying hip abduction x 15 each Goblet squat with 15# 3 x 10 Lateral band walk with black at mid shin 2 x 20 each Walking forward lunge 2 x 10 SL RDL with 10# 2 x 8 each SLS on Airex with rebounder ball toss 3 x 10 each   04/11/2021 Recumbent bike L3 x 4 min while taking subjective Leg press (cybex): 120# 3 x 10 SL RDL with 10# 3 x 8 each Split squat with rear foot elevated holding 10# bilat 3 x 8 each Lateral band walk with blue at ankles 3 x 20 Bridge with hamstring curl using physioball 3 x  8 Knee extension machine (omega) 25# 3 x 10  04/04/2021 Recumbent bike L3 x 5 min while taking subjective SLR x 10 each Bridge x 10 Squat to chair tap x 10 without UE support Goblet squat with 10# 3 x 10 Step-up 8" holding bilat 5# 3 x 10 Lateral band walk with green at ankles 3 x 20 SLS with EO x 30 sec each SLS with forward cone tap using contralateral UE 2 x 10 each    PATIENT EDUCATION:  Education details: POC updated for 1 additional visit, HEP update Person educated: Patient Education method: Explanation, Demonstration, Tactile cues, Verbal cues, and Handouts Education comprehension: verbalized understanding, returned demonstration, verbal cues required, tactile cues required, and needs further education   HOME EXERCISE PROGRAM: Access Code: [email or web address]     ASSESSMENT: CLINICAL IMPRESSION:  Patient tolerated therapy well with no adverse effects. Patient is making great progress in therapy, demonstrating improvement in her strength, functional ability, and pain level. She does occasionally requires cues for proper squat technique to avoid excessive anterior knee translation past toes which places greater stress on the anterior knee. Patient is progressing with her HEP in order to continue strength progression at home, adding walking lunges this visit. Patient would benefit from continued skilled PT to finalize her HEP and ensure confidence with exercise in order to allow for independence and self progression with exercise program.     GOALS: Goals reviewed with patient? Yes   SHORT TERM GOALS:   STG Name Target Date Goal status  1 Patient will be I with initial HEP in order to progress with therapy. Baseline: independent with initial HEP 04/06/2021 ACHIEVED  2 Patient will report pain level </= 5/10 with standing 1 hour in order to reduce functional limitations at work. Baseline: patient reports knees just get tight when she stands, not really pain 04/06/2021 ACHIEVED  3  Patient will report no increase in pain with squatting to improve ability to play with children. Baseline: patient reports no increase pain with body weight squat 04/06/2021 ACHIEVED    LONG TERM GOALS:    LTG Name Target Date Goal status  1 Patient will be I with final HEP to maintain progress from PT. Baseline: progressing with HEP 04/27/2021 ONGOING  2 Patient will demonstrate improvement in strength of bilateral hips to >/= 4/5 MMT in order to improve standing tolerance and stair negotiation. Baseline: grossly 4/5 MMT - 04/20/2021 04/27/2021 ACHIEVED  3 Patient will report >/= 70/80 on LEFS to indicate improved functional ability. Baseline: 71/80 - 04/04/2021 04/27/2021 ACHIEVED  4 Patient will report pain level </= 3/10 with standing 1 hour in order to improve  work related tasks. Baseline: patient reports pain level 2/10 - 04/20/2021 04/27/2021 ACHIEVED      PLAN: PT FREQUENCY: 1x/week   PT DURATION: 1 weeks   PLANNED INTERVENTIONS: Therapeutic exercises, Therapeutic activity, Neuro Muscular re-education, Balance training, Gait training, Patient/Family education, Joint mobilization, Stair training, Aquatic Therapy, Dry Needling, Electrical stimulation, Cryotherapy, Moist heat, Taping, Vasopneumatic device, and Manual therapy   PLAN FOR NEXT SESSION: finalize HEP, progress hip and quad strengthening, squatting, step ups, lunges, machine strengthening, balance training   Hilda Blades, PT, DPT, LAT, ATC 04/20/21  5:45 PM Phone: [phone] Fax: [phone]

## 2021-04-21 ENCOUNTER — Ambulatory Visit: Payer: BC Managed Care – PPO | Admitting: Nurse Practitioner

## 2021-04-21 ENCOUNTER — Other Ambulatory Visit: Payer: Self-pay | Admitting: Nurse Practitioner

## 2021-04-21 ENCOUNTER — Ambulatory Visit (INDEPENDENT_AMBULATORY_CARE_PROVIDER_SITE_OTHER): Payer: 59 | Admitting: Nurse Practitioner

## 2021-04-21 ENCOUNTER — Encounter: Payer: Self-pay | Admitting: Nurse Practitioner

## 2021-04-21 VITALS — BP 132/80 | HR 75 | Temp 98.2°F | Ht 60.0 in | Wt 164.6 lb

## 2021-04-21 DIAGNOSIS — D509 Iron deficiency anemia, unspecified: Secondary | ICD-10-CM

## 2021-04-21 DIAGNOSIS — R5383 Other fatigue: Secondary | ICD-10-CM

## 2021-04-21 LAB — COMPREHENSIVE METABOLIC PANEL
ALT: 14 U/L (ref 0–35)
AST: 23 U/L (ref 0–37)
Albumin: 3.7 g/dL (ref 3.5–5.2)
Alkaline Phosphatase: 72 U/L (ref 39–117)
BUN: 15 mg/dL (ref 6–23)
CO2: 29 mEq/L (ref 19–32)
Calcium: 8.3 mg/dL — ABNORMAL LOW (ref 8.4–10.5)
Chloride: 105 mEq/L (ref 96–112)
Creatinine, Ser: 0.92 mg/dL (ref 0.40–1.20)
GFR: 76.98 mL/min (ref >60.00)
Glucose, Bld: 83 mg/dL (ref 70–99)
Potassium: 3.6 mEq/L (ref 3.5–5.1)
Sodium: 139 mEq/L (ref 135–145)
Total Bilirubin: 0.2 mg/dL (ref 0.2–1.2)
Total Protein: 6.9 g/dL (ref 6.0–8.3)

## 2021-04-21 LAB — CBC WITH DIFFERENTIAL/PLATELET
Basophils Absolute: 0 10*3/uL (ref 0.0–0.1)
Basophils Relative: 0.4 % (ref 0.0–3.0)
Eosinophils Absolute: 0.1 10*3/uL (ref 0.0–0.7)
Eosinophils Relative: 1.5 % (ref 0.0–5.0)
HCT: 28.2 % — ABNORMAL LOW (ref 36.0–46.0)
Hemoglobin: 8.7 g/dL — ABNORMAL LOW (ref 12.0–15.0)
Lymphocytes Relative: 33 % (ref 12.0–46.0)
Lymphs Abs: 2.5 10*3/uL (ref 0.7–4.0)
MCHC: 30.9 g/dL (ref 30.0–36.0)
MCV: 65.7 fl — ABNORMAL LOW (ref 78.0–100.0)
Monocytes Absolute: 0.7 10*3/uL (ref 0.1–1.0)
Monocytes Relative: 8.8 % (ref 3.0–12.0)
Neutro Abs: 4.3 10*3/uL (ref 1.4–7.7)
Neutrophils Relative %: 56.3 % (ref 43.0–77.0)
Platelets: 381 10*3/uL (ref 150.0–400.0)
RBC: 4.29 Mil/uL (ref 3.87–5.11)
RDW: 18.6 % — ABNORMAL HIGH (ref 11.5–15.5)
WBC: 7.7 10*3/uL (ref 4.0–10.5)

## 2021-04-21 LAB — IBC + FERRITIN
Ferritin: 5.7 ng/mL — ABNORMAL LOW (ref 10.0–291.0)
Iron: 20 ug/dL — ABNORMAL LOW (ref 42–145)
Saturation Ratios: 4.2 % — ABNORMAL LOW (ref 20.0–50.0)
TIBC: 477.4 ug/dL — ABNORMAL HIGH (ref 250.0–450.0)
Transferrin: 341 mg/dL (ref 212.0–360.0)

## 2021-04-21 LAB — TSH: TSH: 0.79 u[IU]/mL (ref 0.35–5.50)

## 2021-04-21 LAB — IRON: Iron: 20 ug/dL — ABNORMAL LOW (ref 42–145)

## 2021-04-21 MED ORDER — IRON (FERROUS SULFATE) 325 (65 FE) MG PO TABS: 325.0000 mg | ORAL_TABLET | Freq: Every day | ORAL | 1 refills | Status: AC

## 2021-04-21 NOTE — Progress Notes (Signed)
Subjective:  Patient ID: Emily Downs, female    DOB: 28-Mar-1980  Age: 41 y.o. MRN: [id number]  CC:  Chief Complaint  Patient presents with   Fatigue    Pt states she been also having leg cramps, craving ice.. ? anemic      HPI  This patient arrives today for the above.  Symptoms have been going on for approximately 6 weeks.  She tells me she is had anemia in the past and has a history of thalassemia as well as a history of iron deficiency anemia.  She is not currently taking any iron tablets.  She is concerned she may be anemic again so wanted to be evaluated for this.  She denies heavy periods.  She denies seeing any blood in her stool.  Last menstrual cycle was 04/16/2021.  Past Medical History:  Diagnosis Date   Allergy    Beta thalassemia trait    Beta thalassemia trait    Genital herpes    Gestational diabetes    HPV (human papilloma virus) anogenital infection    Hx of chlamydia infection    Hx of pre-eclampsia in prior pregnancy, currently pregnant    Hypertension    Iron deficiency anemia    Menometrorrhagia       Family History  Problem Relation Age of Onset   Hypertension Mother    Diabetes Mother    Aneurysm Mother 96       brain aneurysm   Sarcoidosis Mother    Hyperlipidemia Father    Diabetes Maternal Grandmother    Stroke Maternal Grandmother    Cancer Paternal Grandmother    Crohn's disease Sister    Hypertension Sister     Social History   Social History Narrative   Marital status: married x 7 years      Children: 81 yo daughter.      Lives: with husband, daughter.      Employment:  Theme park manager, Residence Charity fundraiser      Tobacco; none      Alcohol: rare to none in 2018      Drugs:  None      Exercise: none in 2018      Seatbelt: 100%   Social History   Tobacco Use   Smoking status: Never   Smokeless tobacco: Never  Substance Use Topics   Alcohol use: No     Current Meds  Medication Sig   amLODipine (NORVASC) 10  MG tablet Take 1 tablet (10 mg total) by mouth daily.   cetirizine (ZYRTEC) 10 MG tablet Take 10 mg by mouth daily.   metoprolol succinate (TOPROL-XL) 100 MG 24 hr tablet TAKE 1 TABLET BY MOUTH EVERY DAY    ROS:  Review of Systems  Constitutional:  Positive for malaise/fatigue.       (+) pica  Respiratory:  Negative for shortness of breath.   Cardiovascular:  Negative for chest pain.  Musculoskeletal:        (+) leg cramps    Objective:   Today's Vitals: BP 132/80 (BP Location: Left Arm)    Pulse 75    Temp 98.2 F (36.8 C) (Oral)    Ht 5' (1.524 m)    Wt 164 lb 9.6 oz (74.7 kg)    SpO2 97%    BMI 32.15 kg/m  Vitals with BMI 04/21/2021 02/21/2021 01/23/2021  Height 5' 0"  5' 0"  5' 0"   Weight 164 lbs 10 oz 166 lbs 3 oz 165 lbs 6 oz  BMI 32.15 71.69 67.8  Systolic 938 101 751  Diastolic 80 88 78  Pulse 75 71 73     Physical Exam Vitals reviewed.  Constitutional:      General: She is not in acute distress.    Appearance: Normal appearance.  HENT:     Head: Normocephalic and atraumatic.  Neck:     Vascular: No carotid bruit.  Cardiovascular:     Rate and Rhythm: Normal rate and regular rhythm.     Pulses: Normal pulses.     Heart sounds: Normal heart sounds.  Pulmonary:     Effort: Pulmonary effort is normal.     Breath sounds: Normal breath sounds.  Skin:    General: Skin is warm and dry.  Neurological:     General: No focal deficit present.     Mental Status: She is alert and oriented to person, place, and time.  Psychiatric:        Mood and Affect: Mood normal.        Behavior: Behavior normal.        Judgment: Judgment normal.         Assessment and Plan   1. Other fatigue      Plan: 1.  We will check CBC as well as iron, iron binding capacity, and ferritin.  We will send her home with Hemoccult cards to rule out GI source of bleeding in the event that she is found to be iron deficient.  We will also check TSH to rule out thyroid  involvement.   Tests ordered Orders Placed This Encounter  Procedures   CBC with Differential/Platelet   IBC + Ferritin   Iron   Hemoccult Cards (X3 cards)   Comp Met (CMET)   TSH      No orders of the defined types were placed in this encounter.   Patient to follow-up in 2 months or sooner as needed  Ailene Ards, NP

## 2021-04-23 ENCOUNTER — Encounter: Payer: Self-pay | Admitting: Emergency Medicine

## 2021-04-23 ENCOUNTER — Other Ambulatory Visit: Payer: Self-pay | Admitting: Emergency Medicine

## 2021-04-23 DIAGNOSIS — D509 Iron deficiency anemia, unspecified: Secondary | ICD-10-CM

## 2021-04-23 NOTE — Progress Notes (Signed)
Thank you Sarah.  In addition I will refer her to hematology for urgent evaluation.  She might be a candidate for iron infusion treatment.

## 2021-04-24 ENCOUNTER — Ambulatory Visit: Payer: 59

## 2021-04-24 ENCOUNTER — Telehealth: Payer: Self-pay | Admitting: Physician Assistant

## 2021-04-24 NOTE — Telephone Encounter (Signed)
Maybe

## 2021-04-24 NOTE — Telephone Encounter (Signed)
Scheduled appt per 1/29 referral. Spoke to pt who is aware of appt date and time. Pt is aware to arrive 15 mins prior to appt time.

## 2021-04-26 NOTE — Telephone Encounter (Signed)
Thanks.  She was also referred to hematologist for possible iron infusion treatments.

## 2021-05-03 ENCOUNTER — Encounter: Payer: Self-pay | Admitting: Emergency Medicine

## 2021-05-03 ENCOUNTER — Other Ambulatory Visit: Payer: Self-pay

## 2021-05-03 ENCOUNTER — Telehealth (INDEPENDENT_AMBULATORY_CARE_PROVIDER_SITE_OTHER): Payer: 59 | Admitting: Emergency Medicine

## 2021-05-03 DIAGNOSIS — D509 Iron deficiency anemia, unspecified: Secondary | ICD-10-CM | POA: Diagnosis not present

## 2021-05-03 DIAGNOSIS — J0101 Acute recurrent maxillary sinusitis: Secondary | ICD-10-CM

## 2021-05-03 DIAGNOSIS — R0981 Nasal congestion: Secondary | ICD-10-CM | POA: Diagnosis not present

## 2021-05-03 DIAGNOSIS — I10 Essential (primary) hypertension: Secondary | ICD-10-CM | POA: Diagnosis not present

## 2021-05-03 MED ORDER — AMOXICILLIN-POT CLAVULANATE 875-125 MG PO TABS: 1.0000 | ORAL_TABLET | Freq: Two times a day (BID) | ORAL | 0 refills | Status: AC

## 2021-05-03 NOTE — Progress Notes (Signed)
Telemedicine Encounter- SOAP NOTE Established Patient MyChart video encounter Patient: Home  Provider: Office   Patient present only  This video encounter was conducted with the patient's (or proxy's) verbal consent via audio telecommunications: yes/no: Yes Patient was instructed to have this encounter in a suitably private space; and to only have persons present to whom they give permission to participate. In addition, patient identity was confirmed by use of name plus two identifiers (DOB and address).  I discussed the limitations, risks, security and privacy concerns of performing an evaluation and management service by telephone and the availability of in person appointments. I also discussed with the patient that there may be a patient responsible charge related to this service. The patient expressed understanding and agreed to proceed.  I spent a total of TIME; 0 MIN TO 60 MIN: 30 minutes talking with the patient or their proxy.  Chief complaint: Possible sinus infection  Subjective   Emily Downs is a 41 y.o. female established patient. Telephone visit today complaining of flulike symptoms that started 2 weeks ago progressively getting worse.  Mostly complaining of sinus congestion and sinus headaches similar to sinus infections she is had in the past.  COVID-negative.  Several other family members sick as well.  Denies fever or chills.  Denies difficulty breathing or wheezing.  Denies chest pain or palpitations.  Able to eat and drink.  Denies nausea or vomiting.  Denies abdominal pain or diarrhea. Recently diagnosed with iron deficiency anemia.  Scheduled to see hematologist on 05/10/2021.  May benefit from iron infusion therapy. No other complaints or medical concerns today.  HPI   Patient Active Problem List   Diagnosis Date Noted   Essential hypertension, benign 12/11/2013   Menometrorrhagia    Iron deficiency anemia     Past Medical History:  Diagnosis Date    Allergy    Beta thalassemia trait    Beta thalassemia trait    Genital herpes    Gestational diabetes    HPV (human papilloma virus) anogenital infection    Hx of chlamydia infection    Hx of pre-eclampsia in prior pregnancy, currently pregnant    Hypertension    Iron deficiency anemia    Menometrorrhagia     Current Outpatient Medications  Medication Sig Dispense Refill   amLODipine (NORVASC) 10 MG tablet Take 1 tablet (10 mg total) by mouth daily. 90 tablet 1   cetirizine (ZYRTEC) 10 MG tablet Take 10 mg by mouth daily.     Iron, Ferrous Sulfate, 325 (65 Fe) MG TABS Take 325 mg by mouth daily. 30 tablet 1   metoprolol succinate (TOPROL-XL) 100 MG 24 hr tablet TAKE 1 TABLET BY MOUTH EVERY DAY 90 tablet 0   No current facility-administered medications for this visit.    No Known Allergies  Social History   Socioeconomic History   Marital status: Married    Spouse name: Garyn Waguespack   Number of children: 1   Years of education: MBA   Highest education level: Not on file  Occupational History   Occupation: hair stylist   Occupation: residence Charity fundraiser    Comment: [employer] (04/23/2016)  Tobacco Use   Smoking status: Never   Smokeless tobacco: Never  Vaping Use   Vaping Use: Never used  Substance and Sexual Activity   Alcohol use: No   Drug use: No   Sexual activity: Not Currently    Birth control/protection: None  Other Topics Concern   Not on file  Social History Narrative   Marital status: married x 7 years      Children: 73 yo daughter.      Lives: with husband, daughter.      Employment:  Theme park manager, Residence Charity fundraiser      Tobacco; none      Alcohol: rare to none in 2018      Drugs:  None      Exercise: none in 2018      Seatbelt: 100%   Social Determinants of Radio broadcast assistant Strain: Not on file  Food Insecurity: Not on file  Transportation Needs: Not on file  Physical Activity: Not on file  Stress: Not on file  Social  Connections: Not on file  Intimate Partner Violence: Not on file    Review of Systems  Constitutional: Negative.  Negative for chills and fever.  HENT:  Positive for congestion and sinus pain. Negative for sore throat.   Respiratory: Negative.  Negative for cough and shortness of breath.   Cardiovascular: Negative.  Negative for chest pain and palpitations.  Gastrointestinal:  Negative for abdominal pain, diarrhea, nausea and vomiting.  Genitourinary: Negative.   Skin: Negative.  Negative for rash.  Neurological:  Positive for dizziness and weakness.  All other systems reviewed and are negative.  Objective  Alert and oriented x3 in no apparent respiratory distress. Vitals as reported by the patient: There were no vitals filed for this visit.  Problem List Items Addressed This Visit       Cardiovascular and Mediastinum   Essential hypertension, benign    Stable.  Normal blood pressure readings at home.  Continue amlodipine 10 mg and metoprolol succinate 100 mg daily.        Respiratory   Acute recurrent maxillary sinusitis - Primary    Secondary bacterial infection.  May benefit from Augmentin twice a day for 7 days. Continue over-the-counter sinus decongestants for hypertension.      Relevant Medications   amoxicillin-clavulanate (AUGMENTIN) 875-125 MG tablet     Other   Iron deficiency anemia    Stable and minimally symptomatic.  Has scheduled appointment to see hematologist on 05/10/2021.  Continues taking daily iron supplementation.      Other Visit Diagnoses     Sinus congestion          Clinically stable.  No red flag signs or symptoms.  May benefit from antibiotic therapy. Advised to rest and stay well-hydrated.  Over-the-counter decongestant for hypertensive patient is recommended. Follow-up in the office if no better or worse during the next several days.   I discussed the assessment and treatment plan with the patient. The patient was provided an  opportunity to ask questions and all were answered. The patient agreed with the plan and demonstrated an understanding of the instructions.   The patient was advised to call back or seek an in-person evaluation if the symptoms worsen or if the condition fails to improve as anticipated.  I provided 30 minutes of non-face-to-face time during this encounter.  Horald Pollen, MD  [facility] at [facility]

## 2021-05-03 NOTE — Assessment & Plan Note (Signed)
Secondary bacterial infection.  May benefit from Augmentin twice a day for 7 days. Continue over-the-counter sinus decongestants for hypertension.

## 2021-05-03 NOTE — Assessment & Plan Note (Signed)
Stable.  Normal blood pressure readings at home.  Continue amlodipine 10 mg and metoprolol succinate 100 mg daily.

## 2021-05-03 NOTE — Assessment & Plan Note (Signed)
Stable and minimally symptomatic.  Has scheduled appointment to see hematologist on 05/10/2021.  Continues taking daily iron supplementation.

## 2021-05-09 ENCOUNTER — Other Ambulatory Visit: Payer: Self-pay

## 2021-05-09 ENCOUNTER — Other Ambulatory Visit: Payer: Self-pay | Admitting: Physician Assistant

## 2021-05-09 ENCOUNTER — Other Ambulatory Visit (INDEPENDENT_AMBULATORY_CARE_PROVIDER_SITE_OTHER): Payer: 59

## 2021-05-09 DIAGNOSIS — R5383 Other fatigue: Secondary | ICD-10-CM | POA: Diagnosis not present

## 2021-05-09 DIAGNOSIS — D500 Iron deficiency anemia secondary to blood loss (chronic): Secondary | ICD-10-CM

## 2021-05-09 LAB — HEMOCCULT SLIDES (X 3 CARDS)
Fecal Occult Blood: NEGATIVE
OCCULT 1: NEGATIVE
OCCULT 2: NEGATIVE
OCCULT 3: NEGATIVE
OCCULT 4: NEGATIVE
OCCULT 5: NEGATIVE

## 2021-05-09 NOTE — Progress Notes (Signed)
[place] Telephone:([phone]) [phone]   Fax:(336) [phone]  CONSULT NOTE  REFERRING PHYSICIAN:   REASON FOR CONSULTATION:  Anemia  HPI Emily Downs is a 41 y.o. female with a past medical history significant for menorrhagia, beta thalassemia, acute sinusitis, and hypertension is referred to clinic for iron deficiency anemia  The patient had a follow-up appointment with internal medicine on 04/21/2021.  The patient reported worsening fatigue and due to her history of thalassemia as well as iron deficiency anemia. The patient was concerned that she may be anemic again and wanted to evaluate this. She noticed she had progressive lightheadedness, some occasions occurred while driving. She also noticed increased ice cravings since December 2022.  She was not taking any iron tablets at this time. She previously was on prenatal vitamins that she stopped taking ~September 2022/October 2022.  The patient's labs that day showed significant worsening anemia.  Her CBC from 4 months prior from 01/06/2021 did not show any anemia.  Her labs from 04/21/2021 show significant microcytic anemia with a hemoglobin of 8.7, MCV low at 65.7, elevated RDW at 18.6.  Her iron studies showed significant iron deficiency with an iron of 20, low saturation ratio at 4.2%, low ferritin at 5.7%, and elevated TIBC of 477.4.  The patient was referred to the clinic for evaluation and possible consideration of IV iron infusions.  The oldest records available to me are from 2009 in which the patient had mild anemia at that time with a hemoglobin of 11.3.  It appears she was severely anemic in April 2012 with a hemoglobin of 5.7. It appears that the patient has been having waxing and waning periods of anemia since that time, although she did go several years from 2015-2019 without any anemia.  The patient was actually evaluated in our clinic by Dr. Lamonte Sakai and Dr. Alvy Bimler several years ago. The patient estimates that she is  been anemic since college.  Of note, the patient was also found to have beta thalassemia.  The patient denies ever needing a blood transfusion.  She did receive an iron infusion several years ago on 1 occasion.  She is presently taking over-the-counter ferrous sulfate 325 mg which she started after her primary care visit on 04/21/2021.  She tolerates this well except for mild constipation which she manages with coffee.  Regarding symptoms, she reports fatigue. However, this has improved since starting her iron.  She denies any lightheadedness or dyspnea on exertion.  She denies any palpitations. Regarding her menstrual cycles, she reports she has a menstrual cycle every 4 weeks.  She states the first 3 days are heavy for which she uses super plus or ultra sized tampons and needs to change them every 3-4 hours.  She also uses a pad as well.  She reports the last 3 days of her cycle are spotting.  She reports some clots.  Denies any blood thinner use.  She has never had a colonoscopy due to not meeting the age requirements for screening or diagnostic evaluation.  Her primary care office did arrange for her to have Hemoccult testing performed which was negative x4.  She denies epistaxis, gingival bleeding, hemoptysis, hematemesis, melena, or hematochezia.  Denies any chills, night sweats, unexplained weight loss, or lymphadenopathy.  Denies any NSAID use. Denies any chronic kidney disease.  She denies any particular dietary habits such as being a vegan or vegetarian; however, she estimates she eats red meat about 3-4 per week. Denies any history of  any bariatric surgery.  Her family history consists of a mother who has hypertension and sarcoidosis.  The patient's father is hypertension hyperlipidemia.  The patient's sister is hyper tension and Crohn's disease.  The patient denies any family history of colorectal cancer.  The patient works as a Haematologist.  She is married.  She has 3 children.  She denies any  alcohol, drug, or tobacco use.   HPI  Past Medical History:  Diagnosis Date   Allergy    Beta thalassemia trait    Beta thalassemia trait    Genital herpes    Gestational diabetes    HPV (human papilloma virus) anogenital infection    Hx of chlamydia infection    Hx of pre-eclampsia in prior pregnancy, currently pregnant    Hypertension    Iron deficiency anemia    Menometrorrhagia     Past Surgical History:  Procedure Laterality Date   CESAREAN SECTION N/A 07/23/2019   Procedure: CESAREAN SECTION;  Surgeon: Jerelyn Charles, MD;  Location: [facility];  Service: Obstetrics;  Laterality: N/A;   COLPOSCOPY     DILATION AND EVACUATION  2009   DILATION AND EVACUATION N/A 11/08/2015   Procedure: DILATATION AND EVACUATION WITH CHROMASOME STUDIES;  Surgeon: Servando Salina, MD;  Location: [facility];  Service: Gynecology;  Laterality: N/A;    Family History  Problem Relation Age of Onset   Hypertension Mother    Diabetes Mother    Aneurysm Mother 79       brain aneurysm   Sarcoidosis Mother    Hyperlipidemia Father    Diabetes Maternal Grandmother    Stroke Maternal Grandmother    Cancer Paternal Grandmother    Crohn's disease Sister    Hypertension Sister     Social History Social History   Tobacco Use   Smoking status: Never   Smokeless tobacco: Never  Vaping Use   Vaping Use: Never used  Substance Use Topics   Alcohol use: No   Drug use: No    No Known Allergies  Current Outpatient Medications  Medication Sig Dispense Refill   amLODipine (NORVASC) 10 MG tablet Take 1 tablet (10 mg total) by mouth daily. 90 tablet 1   amoxicillin-clavulanate (AUGMENTIN) 875-125 MG tablet Take 1 tablet by mouth 2 (two) times daily for 7 days. 14 tablet 0   cetirizine (ZYRTEC) 10 MG tablet Take 10 mg by mouth daily.     Iron, Ferrous Sulfate, 325 (65 Fe) MG TABS Take 325 mg by mouth daily. 30 tablet 1   metoprolol succinate (TOPROL-XL) 100 MG 24 hr tablet TAKE 1 TABLET BY MOUTH  EVERY DAY 90 tablet 0   No current facility-administered medications for this visit.    REVIEW OF SYSTEMS:   Review of Systems  Constitutional: Positive for fatigue (improving). negative for appetite change, chills, fever and unexpected weight change.  HENT: Negative for mouth sores, nosebleeds, sore throat and trouble swallowing.   Eyes: Negative for eye problems and icterus.  Respiratory: Negative for cough, hemoptysis, shortness of breath and wheezing.   Cardiovascular: Negative for chest pain and leg swelling.  Gastrointestinal: Negative for abdominal pain, constipation, diarrhea, nausea and vomiting.  Genitourinary: Negative for bladder incontinence, difficulty urinating, dysuria, frequency and hematuria.   Musculoskeletal: Negative for back pain, gait problem, neck pain and neck stiffness.  Skin: Negative for itching and rash.  Neurological: Negative for dizziness, extremity weakness, gait problem, headaches, light-headedness and seizures.  Hematological: Negative for adenopathy. Does not bruise/bleed easily.  Psychiatric/Behavioral:  Negative for confusion, depression and sleep disturbance. The patient is not nervous/anxious.     PHYSICAL EXAMINATION:  Blood pressure 132/79, pulse 63, temperature 97.8 F (36.6 C), temperature source Oral, resp. rate 16, weight 163 lb (73.9 kg), SpO2 100 %, not currently breastfeeding.  ECOG PERFORMANCE STATUS: 0-1  Physical Exam  Constitutional: Oriented to person, place, and time and well-developed, well-nourished, and in no distress.  HENT:  Head: Normocephalic and atraumatic.  Mouth/Throat: Oropharynx is clear and moist. No oropharyngeal exudate.  Eyes: Conjunctivae are normal. Right eye exhibits no discharge. Left eye exhibits no discharge. No scleral icterus.  Neck: Normal range of motion. Neck supple.  Cardiovascular: Normal rate, regular rhythm, normal heart sounds and intact distal pulses.   Pulmonary/Chest: Effort normal and breath  sounds normal. No respiratory distress. No wheezes. No rales.  Abdominal: Soft. Bowel sounds are normal. Exhibits no distension and no mass. There is no tenderness.  Musculoskeletal: Normal range of motion. Exhibits no edema.  Lymphadenopathy:    No cervical adenopathy.  Neurological: Alert and oriented to person, place, and time. Exhibits normal muscle tone. Gait normal. Coordination normal.  Skin: Skin is warm and dry. No rash noted. Not diaphoretic. No erythema. No pallor.  Psychiatric: Mood, memory and judgment normal.  Vitals reviewed.  LABORATORY DATA: Lab Results  Component Value Date   WBC 5.4 05/10/2021   HGB 10.7 (L) 05/10/2021   HCT 35.3 (L) 05/10/2021   MCV 68.5 (L) 05/10/2021   PLT 458 (H) 05/10/2021      Chemistry      Component Value Date/Time   NA 138 05/10/2021 1315   NA 140 06/02/2020 1707   NA 138 03/10/2012 1315   K 3.7 05/10/2021 1315   K 4.3 03/10/2012 1315   CL 105 05/10/2021 1315   CL 105 03/10/2012 1315   CO2 28 05/10/2021 1315   CO2 28 03/10/2012 1315   BUN 12 05/10/2021 1315   BUN 13 06/02/2020 1707   BUN 12.0 03/10/2012 1315   CREATININE 0.88 05/10/2021 1315   CREATININE 0.92 07/26/2015 0942   CREATININE 0.9 03/10/2012 1315      Component Value Date/Time   CALCIUM 8.9 05/10/2021 1315   CALCIUM 8.5 03/10/2012 1315   ALKPHOS 73 05/10/2021 1315   ALKPHOS 49 03/10/2012 1315   AST 23 05/10/2021 1315   AST 21 03/10/2012 1315   ALT 16 05/10/2021 1315   ALT 16 03/10/2012 1315   BILITOT 0.4 05/10/2021 1315   BILITOT 0.28 03/10/2012 1315       RADIOGRAPHIC STUDIES: No results found.  ASSESSMENT: This is a very pleasant 41 year old African-American female referred to clinic for iron deficiency anemia.  She also has beta thalassemia, although this has not affected her.   The patient was seen with Dr. Julien Nordmann today.  The patient's had several lab studies performed including a CBC, CMP, iron studies, ferritin, vitamin G62, folic acid, and SPEP  with immunofixation.  Her PCP gave her stool cards to rule out GI blood loss which was negative.  The patient's labs from today demonstrate  improving microcytic anemia.  Her hemoglobin has increased from 8.7-10.7 in the last 2 weeks.  Her MCV is increased from 65.7-68.5.  Her iron studies also show significant improvement with her iron increasing from 20-101 today.  Her saturation ratio also increased from 4.2% to 22% today.  Her ferritin was 5.72 weeks ago and is 82 today.  The patient has been having a good response to oral iron supplements.  Given her excellent response to oral iron supplements, we will not arrange for any IV iron infusions at this time.  We will see her back for follow-up visit in 2 to 3 months for repeat CBC, iron studies, and ferritin.  If she continues to have persistent iron deficiency at that time, then we can consider arranging for IV iron infusions if needed.   The patient was also encouraged to continue to take an oral iron supplement p.o. daily.  She was encouraged to take this with vitamin C as it helps with the absorption of iron.  She was also given a handout on iron rich food.  The patient voices understanding of current disease status and treatment options and is in agreement with the current care plan.  All questions were answered. The patient knows to call the clinic with any problems, questions or concerns. We can certainly see the patient much sooner if necessary.  Thank you so much for allowing me to participate in the care of Emily Downs. I will continue to follow up the patient with you and assist in her care.   Disclaimer: This note was dictated with voice recognition software. Similar sounding words can inadvertently be transcribed and may not be corrected upon review.   Phuoc Huy L Season Astacio May 10, 2021, 3:25 PM  ADDENDUM: Hematology/Oncology Attending: I had a face-to-face encounter with the patient today.  I reviewed her  records, lab and recommended her care plan.  This is a very pleasant 41 years old African-American female with history of beta thalassemia as well as long history of iron deficiency anemia secondary to menorrhagia.  The patient also has a history of hypertension.  She has been on oral iron tablets on and off for several years.  The patient continues to have heavy menstruation and she does not take her oral iron tablets at regular basis. She was seen recently by her primary care provider and she was found to have significant microcytic anemia with hemoglobin of 8.7 and low MCV of 65.7%.  Her ferritin level was low at 5.7 and iron saturation 4.2% with low serum iron of 20.  She started over the counter oral iron tablets and she is feeling a little bit better. The patient was seen in the past at the cancer center by several providers including Dr. Lamonte Sakai and Dr. Alvy Bimler.  Repeat CBC today showed improvement of her hemoglobin and hematocrit but she continues to have persistent anemia with hemoglobin of 10.7 and hematocrit 35.3% with MCV of 68.5%.  Ferritin level improved to 82.  Serum iron is normal at 101 with iron saturation of 22%. I explained to the patient that she is responding to the oral iron tablet in a good way with improvement of her condition. I recommended for her to continue with the oral iron tablets for now. We will see her back for follow-up visit in 2-3 months for evaluation and repeat CBC, iron study and ferritin.  If she has no further improvement in her anemia, we may consider her for iron infusion but for now I think it will be reasonable to continue with the oral iron tablets.  She is tolerating it well except for occasional constipation that is manageable at this point. The patient was advised to call immediately if she has any other concerning symptoms in the interval. The total time spent in the appointment was 60 minutes. Disclaimer: This note was dictated with voice recognition software.  Similar sounding words can inadvertently be transcribed  and may be missed upon review. Eilleen Kempf, MD 05/10/21

## 2021-05-10 ENCOUNTER — Inpatient Hospital Stay: Payer: 59 | Attending: Physician Assistant | Admitting: Physician Assistant

## 2021-05-10 ENCOUNTER — Other Ambulatory Visit: Payer: Self-pay

## 2021-05-10 ENCOUNTER — Encounter: Payer: Self-pay | Admitting: Physician Assistant

## 2021-05-10 ENCOUNTER — Inpatient Hospital Stay: Payer: 59

## 2021-05-10 VITALS — BP 132/79 | HR 63 | Temp 97.8°F | Resp 16 | Wt 163.0 lb

## 2021-05-10 DIAGNOSIS — I10 Essential (primary) hypertension: Secondary | ICD-10-CM | POA: Diagnosis not present

## 2021-05-10 DIAGNOSIS — Z79899 Other long term (current) drug therapy: Secondary | ICD-10-CM | POA: Insufficient documentation

## 2021-05-10 DIAGNOSIS — R5383 Other fatigue: Secondary | ICD-10-CM | POA: Diagnosis not present

## 2021-05-10 DIAGNOSIS — A630 Anogenital (venereal) warts: Secondary | ICD-10-CM | POA: Diagnosis not present

## 2021-05-10 DIAGNOSIS — N920 Excessive and frequent menstruation with regular cycle: Secondary | ICD-10-CM | POA: Insufficient documentation

## 2021-05-10 DIAGNOSIS — D500 Iron deficiency anemia secondary to blood loss (chronic): Secondary | ICD-10-CM

## 2021-05-10 DIAGNOSIS — D561 Beta thalassemia: Secondary | ICD-10-CM | POA: Insufficient documentation

## 2021-05-10 DIAGNOSIS — R42 Dizziness and giddiness: Secondary | ICD-10-CM | POA: Diagnosis not present

## 2021-05-10 DIAGNOSIS — D509 Iron deficiency anemia, unspecified: Secondary | ICD-10-CM | POA: Insufficient documentation

## 2021-05-10 DIAGNOSIS — A609 Anogenital herpesviral infection, unspecified: Secondary | ICD-10-CM | POA: Diagnosis not present

## 2021-05-10 LAB — CMP (CANCER CENTER ONLY)
ALT: 16 U/L (ref 0–44)
AST: 23 U/L (ref 15–41)
Albumin: 4 g/dL (ref 3.5–5.0)
Alkaline Phosphatase: 73 U/L (ref 38–126)
Anion gap: 5 (ref 5–15)
BUN: 12 mg/dL (ref 6–20)
CO2: 28 mmol/L (ref 22–32)
Calcium: 8.9 mg/dL (ref 8.9–10.3)
Chloride: 105 mmol/L (ref 98–111)
Creatinine: 0.88 mg/dL (ref 0.44–1.00)
GFR, Estimated: >60 mL/min (ref >60)
Glucose, Bld: 86 mg/dL (ref 70–99)
Potassium: 3.7 mmol/L (ref 3.5–5.1)
Sodium: 138 mmol/L (ref 135–145)
Total Bilirubin: 0.4 mg/dL (ref 0.3–1.2)
Total Protein: 7.5 g/dL (ref 6.5–8.1)

## 2021-05-10 LAB — CBC WITH DIFFERENTIAL (CANCER CENTER ONLY)
Abs Immature Granulocytes: 0 10*3/uL (ref 0.00–0.07)
Basophils Absolute: 0 10*3/uL (ref 0.0–0.1)
Basophils Relative: 1 %
Eosinophils Absolute: 0.1 10*3/uL (ref 0.0–0.5)
Eosinophils Relative: 2 %
HCT: 35.3 % — ABNORMAL LOW (ref 36.0–46.0)
Hemoglobin: 10.7 g/dL — ABNORMAL LOW (ref 12.0–15.0)
Immature Granulocytes: 0 %
Lymphocytes Relative: 38 %
Lymphs Abs: 2.1 10*3/uL (ref 0.7–4.0)
MCH: 20.8 pg — ABNORMAL LOW (ref 26.0–34.0)
MCHC: 30.3 g/dL (ref 30.0–36.0)
MCV: 68.5 fL — ABNORMAL LOW (ref 80.0–100.0)
Monocytes Absolute: 0.4 10*3/uL (ref 0.1–1.0)
Monocytes Relative: 8 %
Neutro Abs: 2.8 10*3/uL (ref 1.7–7.7)
Neutrophils Relative %: 51 %
Platelet Count: 458 10*3/uL — ABNORMAL HIGH (ref 150–400)
RBC: 5.15 MIL/uL — ABNORMAL HIGH (ref 3.87–5.11)
RDW: 22.1 % — ABNORMAL HIGH (ref 11.5–15.5)
Smear Review: NORMAL
WBC Count: 5.4 10*3/uL (ref 4.0–10.5)
nRBC: 0 % (ref 0.0–0.2)

## 2021-05-10 LAB — IRON AND IRON BINDING CAPACITY (CC-WL,HP ONLY)
Iron: 101 ug/dL (ref 28–170)
Saturation Ratios: 22 % (ref 10.4–31.8)
TIBC: 462 ug/dL — ABNORMAL HIGH (ref 250–450)
UIBC: 361 ug/dL (ref 148–442)

## 2021-05-10 LAB — SAMPLE TO BLOOD BANK

## 2021-05-10 LAB — VITAMIN B12: Vitamin B-12: 838 pg/mL (ref 180–914)

## 2021-05-10 LAB — FOLATE: Folate: 97.7 ng/mL (ref >5.9)

## 2021-05-10 LAB — FERRITIN: Ferritin: 82 ng/mL (ref 11–307)

## 2021-05-11 LAB — PROTEIN ELECTROPHORESIS, SERUM, WITH REFLEX
A/G Ratio: 0.9 (ref 0.7–1.7)
Albumin ELP: 3.5 g/dL (ref 2.9–4.4)
Alpha-1-Globulin: 0.2 g/dL (ref 0.0–0.4)
Alpha-2-Globulin: 0.7 g/dL (ref 0.4–1.0)
Beta Globulin: 1.3 g/dL (ref 0.7–1.3)
Gamma Globulin: 1.5 g/dL (ref 0.4–1.8)
Globulin, Total: 3.7 g/dL (ref 2.2–3.9)
Total Protein ELP: 7.2 g/dL (ref 6.0–8.5)

## 2021-06-16 ENCOUNTER — Other Ambulatory Visit: Payer: Self-pay | Admitting: Emergency Medicine

## 2021-06-19 ENCOUNTER — Ambulatory Visit: Payer: 59 | Admitting: Emergency Medicine

## 2021-06-19 ENCOUNTER — Telehealth: Payer: Self-pay

## 2021-06-19 NOTE — Telephone Encounter (Signed)
Pt states that her son has strep throat and was wondering if she could get medication to treat her sxs as well.  ? ?I advise pt of Dr. Mitchel Honour first appt was 3/30 pt declined and said she would figure something else out. ? ?FYI ?

## 2021-07-14 NOTE — Progress Notes (Signed)
[facility] ?OFFICE PROGRESS NOTE ? ?Horald Pollen, MD ?[address] ?[place] [address] ? ?DIAGNOSIS: Iron deficiency anemia and thalassemia ? ?PRIOR THERAPY: None ? ?CURRENT THERAPY: Ferrous sulfate 325 mg p.o. daily. ? ?INTERVAL HISTORY: ?Emily Downs 41 y.o. female returns to the clinic today for a follow-up visit.  The patient was first seen in the clinic in February 2023 for worsening iron deficiency anemia.  Of note, the patient also has a history of thalassemia.  The patient has had on and off anemia since college.  In December 2022, the patient was having more symptomatic anemia episodes with increased fatigue and lightheadedness.  She was referred to our clinic for consideration of IV iron; however, the patient had also recently started taking an over-the-counter iron supplement with ferrous sulfate 325 mg and had an excellent response to oral iron supplements.  Therefore, the patient did not undergo IV iron infusions.  Since last being seen, the patient is feeling "much better". The patient has been compliant with her iron supplement.  She reports improvement in her fatigue.  She denies any lightheadedness or dyspnea on exertion.  She denies any palpitations.  She denies any abnormal bleeding or bruising to her knowledge. Her pica has resolved.  The patient is here today for evaluation and repeat blood work. ? ? ? ?MEDICAL HISTORY: ?Past Medical History:  ?Diagnosis Date  ? Allergy   ? Beta thalassemia trait   ? Beta thalassemia trait   ? Genital herpes   ? Gestational diabetes   ? HPV (human papilloma virus) anogenital infection   ? Hx of chlamydia infection   ? Hx of pre-eclampsia in prior pregnancy, currently pregnant   ? Hypertension   ? Iron deficiency anemia   ? Menometrorrhagia   ? ? ?ALLERGIES:  has No Known Allergies. ? ?MEDICATIONS:  ?Current Outpatient Medications  ?Medication Sig Dispense Refill  ? amLODipine (NORVASC) 10 MG tablet Take 1 tablet (10 mg total) by  mouth daily. 90 tablet 1  ? cetirizine (ZYRTEC) 10 MG tablet Take 10 mg by mouth daily.    ? Iron, Ferrous Sulfate, 325 (65 Fe) MG TABS Take 325 mg by mouth daily. 30 tablet 1  ? metoprolol succinate (TOPROL-XL) 100 MG 24 hr tablet TAKE 1 TABLET BY MOUTH EVERY DAY 90 tablet 0  ? ?No current facility-administered medications for this visit.  ? ? ?SURGICAL HISTORY:  ?Past Surgical History:  ?Procedure Laterality Date  ? CESAREAN SECTION N/A 07/23/2019  ? Procedure: CESAREAN SECTION;  Surgeon: Jerelyn Charles, MD;  Location: [facility];  Service: Obstetrics;  Laterality: N/A;  ? COLPOSCOPY    ? DILATION AND EVACUATION  2009  ? DILATION AND EVACUATION N/A 11/08/2015  ? Procedure: DILATATION AND EVACUATION WITH CHROMASOME STUDIES;  Surgeon: Servando Salina, MD;  Location: [facility];  Service: Gynecology;  Laterality: N/A;  ? ? ?REVIEW OF SYSTEMS:   ?Review of Systems  ?Constitutional: Negative for appetite change, chills, fatigue, fever and unexpected weight change.  ?HENT:   Negative for mouth sores, nosebleeds, sore throat and trouble swallowing.   ?Eyes: Negative for eye problems and icterus.  ?Respiratory: Negative for cough, hemoptysis, shortness of breath and wheezing.   ?Cardiovascular: Negative for chest pain and leg swelling.  ?Gastrointestinal: Negative for abdominal pain, constipation, diarrhea, nausea and vomiting.  ?Genitourinary: Negative for bladder incontinence, difficulty urinating, dysuria, frequency and hematuria.   ?Musculoskeletal: Negative for back pain, gait problem, neck pain and neck stiffness.  ?Skin: Negative  for itching and rash.  ?Neurological: Negative for dizziness, extremity weakness, gait problem, headaches, light-headedness and seizures.  ?Hematological: Negative for adenopathy. Does not bruise/bleed easily.  ?Psychiatric/Behavioral: Negative for confusion, depression and sleep disturbance. The patient is not nervous/anxious.   ? ? ?PHYSICAL EXAMINATION:  ?Blood pressure 127/83, pulse 64,  temperature 98 ?F (36.7 ?C), temperature source Tympanic, resp. rate 16, weight 163 lb 11.2 oz (74.3 kg), SpO2 100 %, not currently breastfeeding. ? ?ECOG PERFORMANCE STATUS: 0 ? ?Physical Exam  ?Constitutional: Oriented to person, place, and time and well-developed, well-nourished, and in no distress.  ?HENT:  ?Head: Normocephalic and atraumatic.  ?Mouth/Throat: Oropharynx is clear and moist. No oropharyngeal exudate.  ?Eyes: Conjunctivae are normal. Right eye exhibits no discharge. Left eye exhibits no discharge. No scleral icterus.  ?Neck: Normal range of motion. Neck supple.  ?Cardiovascular: Normal rate, regular rhythm, normal heart sounds and intact distal pulses.   ?Pulmonary/Chest: Effort normal and breath sounds normal. No respiratory distress. No wheezes. No rales.  ?Abdominal: Soft. Bowel sounds are normal. Exhibits no distension and no mass. There is no tenderness.  ?Musculoskeletal: Normal range of motion. Exhibits no edema.  ?Lymphadenopathy:  ?  No cervical adenopathy.  ?Neurological: Alert and oriented to person, place, and time. Exhibits normal muscle tone. Gait normal. Coordination normal.  ?Skin: Skin is warm and dry. No rash noted. Not diaphoretic. No erythema. No pallor.  ?Psychiatric: Mood, memory and judgment normal.  ?Vitals reviewed. ? ?LABORATORY DATA: ?Lab Results  ?Component Value Date  ? WBC 7.0 07/19/2021  ? HGB 11.3 (L) 07/19/2021  ? HCT 36.7 07/19/2021  ? MCV 71.4 (L) 07/19/2021  ? PLT 399 07/19/2021  ? ? ?  Chemistry   ?   ?Component Value Date/Time  ? NA 138 05/10/2021 1315  ? NA 140 06/02/2020 1707  ? NA 138 03/10/2012 1315  ? K 3.7 05/10/2021 1315  ? K 4.3 03/10/2012 1315  ? CL 105 05/10/2021 1315  ? CL 105 03/10/2012 1315  ? CO2 28 05/10/2021 1315  ? CO2 28 03/10/2012 1315  ? BUN 12 05/10/2021 1315  ? BUN 13 06/02/2020 1707  ? BUN 12.0 03/10/2012 1315  ? CREATININE 0.88 05/10/2021 1315  ? CREATININE 0.92 07/26/2015 0942  ? CREATININE 0.9 03/10/2012 1315  ?    ?Component Value  Date/Time  ? CALCIUM 8.9 05/10/2021 1315  ? CALCIUM 8.5 03/10/2012 1315  ? ALKPHOS 73 05/10/2021 1315  ? ALKPHOS 49 03/10/2012 1315  ? AST 23 05/10/2021 1315  ? AST 21 03/10/2012 1315  ? ALT 16 05/10/2021 1315  ? ALT 16 03/10/2012 1315  ? BILITOT 0.4 05/10/2021 1315  ? BILITOT 0.28 03/10/2012 1315  ?  ? ? ? ?RADIOGRAPHIC STUDIES: ? ?No results found. ? ? ?ASSESSMENT/PLAN:  ?This is a very pleasant 41 year old African-American female seen in the clinic for iron deficiency anemia.  She also has a history of beta thalassemia although this has not affected her. ? ?The patient is currently undergoing ferrous sulfate supplement 325 mg p.o. daily.  She tolerated this well without any concerning adverse side effects except for mild constipation for which she drinks coffee with improvement of her symptoms. ? ?The patient a repeat CBC, iron studies, ferritin.  The patient's labs from today show mildly continued but improving microcytic anemia with a hemoglobin of 11.3 and MCV of 71.4 compared to 10.7 and 65.7 prior. Her iron studies and ferritin are pending.  ? ?I will wait for the results of her ferritin and  iron before deciding if she requires an IV iron infusion. We may consider IV iron if her iron studies show significant iron deficiency. However, likely will recommend she continue with her OTC iron supplement since it looks like she only has mild anemia at this time.  ? ?We will see her back for follow-up visit in 3 months for evaluation and repeat blood work.  If the patient continues to have stability in her anemia/iron deficiency, we will give her the option of being released to her primary care provider. ? ?In the meantime, the patient will continue taking her iron supplement.  She was also encouraged to increase her dietary intake of iron rich food. ? ?The patient was advised to call immediately if she has any concerning symptoms in the interval. ?The patient voices understanding of current disease status and  treatment options and is in agreement with the current care plan. ?All questions were answered. The patient knows to call the clinic with any problems, questions or concerns. We can certainly see the patient much

## 2021-07-19 ENCOUNTER — Inpatient Hospital Stay (HOSPITAL_BASED_OUTPATIENT_CLINIC_OR_DEPARTMENT_OTHER): Payer: 59 | Admitting: Physician Assistant

## 2021-07-19 ENCOUNTER — Encounter: Payer: Self-pay | Admitting: Physician Assistant

## 2021-07-19 ENCOUNTER — Inpatient Hospital Stay: Payer: 59 | Attending: Physician Assistant

## 2021-07-19 ENCOUNTER — Other Ambulatory Visit: Payer: Self-pay

## 2021-07-19 VITALS — BP 127/83 | HR 64 | Temp 98.0°F | Resp 16 | Wt 163.7 lb

## 2021-07-19 DIAGNOSIS — D500 Iron deficiency anemia secondary to blood loss (chronic): Secondary | ICD-10-CM

## 2021-07-19 DIAGNOSIS — Z79899 Other long term (current) drug therapy: Secondary | ICD-10-CM | POA: Diagnosis not present

## 2021-07-19 DIAGNOSIS — K5900 Constipation, unspecified: Secondary | ICD-10-CM | POA: Diagnosis not present

## 2021-07-19 DIAGNOSIS — D561 Beta thalassemia: Secondary | ICD-10-CM | POA: Insufficient documentation

## 2021-07-19 DIAGNOSIS — D509 Iron deficiency anemia, unspecified: Secondary | ICD-10-CM | POA: Diagnosis present

## 2021-07-19 LAB — CBC WITH DIFFERENTIAL (CANCER CENTER ONLY)
Abs Immature Granulocytes: 0.01 10*3/uL (ref 0.00–0.07)
Basophils Absolute: 0 10*3/uL (ref 0.0–0.1)
Basophils Relative: 0 %
Eosinophils Absolute: 0.1 10*3/uL (ref 0.0–0.5)
Eosinophils Relative: 2 %
HCT: 36.7 % (ref 36.0–46.0)
Hemoglobin: 11.3 g/dL — ABNORMAL LOW (ref 12.0–15.0)
Immature Granulocytes: 0 %
Lymphocytes Relative: 44 %
Lymphs Abs: 3 10*3/uL (ref 0.7–4.0)
MCH: 22 pg — ABNORMAL LOW (ref 26.0–34.0)
MCHC: 30.8 g/dL (ref 30.0–36.0)
MCV: 71.4 fL — ABNORMAL LOW (ref 80.0–100.0)
Monocytes Absolute: 0.5 10*3/uL (ref 0.1–1.0)
Monocytes Relative: 7 %
Neutro Abs: 3.3 10*3/uL (ref 1.7–7.7)
Neutrophils Relative %: 47 %
Platelet Count: 399 10*3/uL (ref 150–400)
RBC: 5.14 MIL/uL — ABNORMAL HIGH (ref 3.87–5.11)
RDW: 17.6 % — ABNORMAL HIGH (ref 11.5–15.5)
WBC Count: 7 10*3/uL (ref 4.0–10.5)
nRBC: 0 % (ref 0.0–0.2)

## 2021-07-19 LAB — IRON AND IRON BINDING CAPACITY (CC-WL,HP ONLY)
Iron: 101 ug/dL (ref 28–170)
Saturation Ratios: 25 % (ref 10.4–31.8)
TIBC: 408 ug/dL (ref 250–450)
UIBC: 307 ug/dL

## 2021-07-19 LAB — FERRITIN: Ferritin: 11 ng/mL (ref 11–307)

## 2021-07-20 ENCOUNTER — Encounter: Payer: Self-pay | Admitting: Physician Assistant

## 2021-09-06 ENCOUNTER — Encounter (HOSPITAL_COMMUNITY): Payer: Self-pay | Admitting: Emergency Medicine

## 2021-09-06 ENCOUNTER — Ambulatory Visit (HOSPITAL_COMMUNITY)
Admission: EM | Admit: 2021-09-06 | Discharge: 2021-09-06 | Disposition: A | Discharge status: Home / Self Care | Payer: 59 | Attending: Family Medicine | Admitting: Family Medicine

## 2021-09-06 ENCOUNTER — Ambulatory Visit (INDEPENDENT_AMBULATORY_CARE_PROVIDER_SITE_OTHER): Payer: 59

## 2021-09-06 DIAGNOSIS — M25512 Pain in left shoulder: Secondary | ICD-10-CM

## 2021-09-06 DIAGNOSIS — M25552 Pain in left hip: Secondary | ICD-10-CM

## 2021-09-06 MED ORDER — NAPROXEN 500 MG PO TABS: 500.0000 mg | ORAL_TABLET | Freq: Two times a day (BID) | ORAL | 0 refills | Status: DC

## 2021-09-06 MED ORDER — CYCLOBENZAPRINE HCL 10 MG PO TABS: ORAL_TABLET | ORAL | 0 refills | Status: DC
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
## 2021-09-15 ENCOUNTER — Other Ambulatory Visit: Payer: Self-pay | Admitting: Obstetrics

## 2021-09-15 DIAGNOSIS — R928 Other abnormal and inconclusive findings on diagnostic imaging of breast: Secondary | ICD-10-CM

## 2021-09-22 ENCOUNTER — Ambulatory Visit
Admission: RE | Admit: 2021-09-22 | Discharge: 2021-09-22 | Disposition: A | Discharge status: Home / Self Care | Payer: 59 | Source: Ambulatory Visit | Attending: Obstetrics | Admitting: Obstetrics

## 2021-09-22 ENCOUNTER — Other Ambulatory Visit: Payer: Self-pay | Admitting: Obstetrics

## 2021-09-22 DIAGNOSIS — R928 Other abnormal and inconclusive findings on diagnostic imaging of breast: Secondary | ICD-10-CM

## 2021-09-22 DIAGNOSIS — N6489 Other specified disorders of breast: Secondary | ICD-10-CM

## 2021-10-18 ENCOUNTER — Other Ambulatory Visit: Payer: Self-pay

## 2021-10-18 ENCOUNTER — Inpatient Hospital Stay (HOSPITAL_BASED_OUTPATIENT_CLINIC_OR_DEPARTMENT_OTHER): Payer: 59 | Admitting: Internal Medicine

## 2021-10-18 ENCOUNTER — Inpatient Hospital Stay: Payer: 59 | Attending: Physician Assistant

## 2021-10-18 VITALS — BP 139/86 | HR 67 | Resp 18 | Wt 165.1 lb

## 2021-10-18 DIAGNOSIS — D509 Iron deficiency anemia, unspecified: Secondary | ICD-10-CM | POA: Insufficient documentation

## 2021-10-18 DIAGNOSIS — K5900 Constipation, unspecified: Secondary | ICD-10-CM | POA: Diagnosis not present

## 2021-10-18 DIAGNOSIS — D563 Thalassemia minor: Secondary | ICD-10-CM | POA: Diagnosis not present

## 2021-10-18 DIAGNOSIS — Z79899 Other long term (current) drug therapy: Secondary | ICD-10-CM | POA: Diagnosis not present

## 2021-10-18 DIAGNOSIS — D500 Iron deficiency anemia secondary to blood loss (chronic): Secondary | ICD-10-CM

## 2021-10-18 LAB — CBC WITH DIFFERENTIAL (CANCER CENTER ONLY)
Abs Immature Granulocytes: 0.01 10*3/uL (ref 0.00–0.07)
Basophils Absolute: 0 10*3/uL (ref 0.0–0.1)
Basophils Relative: 1 %
Eosinophils Absolute: 0.2 10*3/uL (ref 0.0–0.5)
Eosinophils Relative: 3 %
HCT: 35.9 % — ABNORMAL LOW (ref 36.0–46.0)
Hemoglobin: 11.7 g/dL — ABNORMAL LOW (ref 12.0–15.0)
Immature Granulocytes: 0 %
Lymphocytes Relative: 41 %
Lymphs Abs: 2.4 10*3/uL (ref 0.7–4.0)
MCH: 23.5 pg — ABNORMAL LOW (ref 26.0–34.0)
MCHC: 32.6 g/dL (ref 30.0–36.0)
MCV: 72.2 fL — ABNORMAL LOW (ref 80.0–100.0)
Monocytes Absolute: 0.5 10*3/uL (ref 0.1–1.0)
Monocytes Relative: 8 %
Neutro Abs: 2.8 10*3/uL (ref 1.7–7.7)
Neutrophils Relative %: 47 %
Platelet Count: 364 10*3/uL (ref 150–400)
RBC: 4.97 MIL/uL (ref 3.87–5.11)
RDW: 17 % — ABNORMAL HIGH (ref 11.5–15.5)
WBC Count: 5.9 10*3/uL (ref 4.0–10.5)
nRBC: 0 % (ref 0.0–0.2)

## 2021-10-18 LAB — FERRITIN: Ferritin: 16 ng/mL (ref 11–307)

## 2021-10-18 LAB — IRON AND IRON BINDING CAPACITY (CC-WL,HP ONLY)
Iron: 82 ug/dL (ref 28–170)
Saturation Ratios: 20 % (ref 10.4–31.8)
TIBC: 408 ug/dL (ref 250–450)
UIBC: 326 ug/dL

## 2021-10-18 NOTE — Progress Notes (Signed)
[facility] [phone]   [phone]  OFFICE PROGRESS NOTE  Horald Pollen, MD [place] [address]  DIAGNOSIS: Iron deficiency anemia and thalassemia minor  PRIOR THERAPY: None  CURRENT THERAPY: Ferrous sulfate 325 mg p.o. daily  INTERVAL HISTORY: Emily Downs 41 y.o. female returns to the clinic today for follow-up visit.  The patient is feeling fine today with no concerning complaints.  She continues to tolerate her oral iron tablet fairly well except for occasional constipation.  She denied having any significant fatigue or weakness.  She has no nausea, vomiting, diarrhea or constipation.  She has no headache or visual changes.  She denied having any chest pain, shortness of breath, cough or hemoptysis.  She is here today for evaluation and repeat blood work.  MEDICAL HISTORY: Past Medical History:  Diagnosis Date   Allergy    Beta thalassemia trait    Beta thalassemia trait    Genital herpes    Gestational diabetes    HPV (human papilloma virus) anogenital infection    Hx of chlamydia infection    Hx of pre-eclampsia in prior pregnancy, currently pregnant    Hypertension    Iron deficiency anemia    Menometrorrhagia     ALLERGIES:  has No Known Allergies.  MEDICATIONS:  Current Outpatient Medications  Medication Sig Dispense Refill   amLODipine (NORVASC) 10 MG tablet Take 1 tablet (10 mg total) by mouth daily. 90 tablet 1   cetirizine (ZYRTEC) 10 MG tablet Take 10 mg by mouth daily.     Iron, Ferrous Sulfate, 325 (65 Fe) MG TABS Take 325 mg by mouth daily. 30 tablet 1   metoprolol succinate (TOPROL-XL) 100 MG 24 hr tablet TAKE 1 TABLET BY MOUTH EVERY DAY 90 tablet 0   No current facility-administered medications for this visit.    SURGICAL HISTORY:  Past Surgical History:  Procedure Laterality Date   CESAREAN SECTION N/A 07/23/2019   Procedure: CESAREAN SECTION;  Surgeon: Jerelyn Charles, MD;   Location: [facility];  Service: Obstetrics;  Laterality: N/A;   COLPOSCOPY     DILATION AND EVACUATION  2009   DILATION AND EVACUATION N/A 11/08/2015   Procedure: DILATATION AND EVACUATION WITH CHROMASOME STUDIES;  Surgeon: Servando Salina, MD;  Location: [facility];  Service: Gynecology;  Laterality: N/A;    REVIEW OF SYSTEMS:  A comprehensive review of systems was negative except for: Constitutional: positive for fatigue   PHYSICAL EXAMINATION: General appearance: alert, cooperative, fatigued, and no distress Head: Normocephalic, without obvious abnormality, atraumatic Neck: no adenopathy, no JVD, supple, symmetrical, trachea midline, and thyroid not enlarged, symmetric, no tenderness/mass/nodules Lymph nodes: Cervical, supraclavicular, and axillary nodes normal. Resp: clear to auscultation bilaterally Back: symmetric, no curvature. ROM normal. No CVA tenderness. Cardio: regular rate and rhythm, S1, S2 normal, no murmur, click, rub or gallop GI: soft, non-tender; bowel sounds normal; no masses,  no organomegaly Extremities: extremities normal, atraumatic, no cyanosis or edema  ECOG PERFORMANCE STATUS: 1 - Symptomatic but completely ambulatory  Blood pressure 139/86, pulse 67, resp. rate 18, weight 165 lb 2 oz (74.9 kg), last menstrual period 09/13/2021, SpO2 100 %.  LABORATORY DATA: Lab Results  Component Value Date   WBC 5.9 10/18/2021   HGB 11.7 (L) 10/18/2021   HCT 35.9 (L) 10/18/2021   MCV 72.2 (L) 10/18/2021   PLT 364 10/18/2021      Chemistry      Component Value Date/Time  NA 138 05/10/2021 1315   NA 140 06/02/2020 1707   NA 138 03/10/2012 1315   K 3.7 05/10/2021 1315   K 4.3 03/10/2012 1315   CL 105 05/10/2021 1315   CL 105 03/10/2012 1315   CO2 28 05/10/2021 1315   CO2 28 03/10/2012 1315   BUN 12 05/10/2021 1315   BUN 13 06/02/2020 1707   BUN 12.0 03/10/2012 1315   CREATININE 0.88 05/10/2021 1315   CREATININE 0.92 07/26/2015 0942   CREATININE 0.9 03/10/2012  1315      Component Value Date/Time   CALCIUM 8.9 05/10/2021 1315   CALCIUM 8.5 03/10/2012 1315   ALKPHOS 73 05/10/2021 1315   ALKPHOS 49 03/10/2012 1315   AST 23 05/10/2021 1315   AST 21 03/10/2012 1315   ALT 16 05/10/2021 1315   ALT 16 03/10/2012 1315   BILITOT 0.4 05/10/2021 1315   BILITOT 0.28 03/10/2012 1315       RADIOGRAPHIC STUDIES: MM DIAG BREAST TOMO UNI LEFT  Result Date: 09/22/2021 CLINICAL DATA:  41 year old female presenting as a recall from baseline screening for possible left breast asymmetries. EXAM: DIGITAL DIAGNOSTIC UNILATERAL LEFT MAMMOGRAM WITH TOMOSYNTHESIS AND CAD; ULTRASOUND LEFT BREAST LIMITED TECHNIQUE: Left digital diagnostic mammography and breast tomosynthesis was performed. The images were evaluated with computer-aided detection.; Targeted ultrasound examination of the left breast was performed. COMPARISON:  Previous exam(s). ACR Breast Density Category c: The breast tissue is heterogeneously dense, which may obscure small masses. FINDINGS: Mammogram: Spot compression tomosynthesis as well as full cc and mL tomosynthesis views of the left breast were performed for questioned asymmetries in the central inferior and upper medial breast. On the additional spot imaging the asymmetry in the central inferior breast resolves on the spot imaging though persists on the full field cc view. The asymmetry in the superior breast resolves on the additional imaging. Ultrasound: Targeted ultrasound performed in the central inferior and superior aspect of the left breast demonstrating no cystic or solid mass. There is only normal fibroglandular tissue. IMPRESSION: Probably benign asymmetry in the central inferior left breast without sonographic correlate, likely representing normal fibroglandular tissue. RECOMMENDATION: Diagnostic left breast mammogram in 6 months. I have discussed the findings and recommendations with the patient who agrees to short-term follow-up. If applicable, a  reminder letter will be sent to the patient regarding the next appointment. BI-RADS CATEGORY  3: Probably benign. Electronically Signed   By: Audie Pinto M.D.   On: 09/22/2021 16:01  US BREAST LTD UNI LEFT INC AXILLA  Result Date: 09/22/2021 CLINICAL DATA:  41 year old female presenting as a recall from baseline screening for possible left breast asymmetries. EXAM: DIGITAL DIAGNOSTIC UNILATERAL LEFT MAMMOGRAM WITH TOMOSYNTHESIS AND CAD; ULTRASOUND LEFT BREAST LIMITED TECHNIQUE: Left digital diagnostic mammography and breast tomosynthesis was performed. The images were evaluated with computer-aided detection.; Targeted ultrasound examination of the left breast was performed. COMPARISON:  Previous exam(s). ACR Breast Density Category c: The breast tissue is heterogeneously dense, which may obscure small masses. FINDINGS: Mammogram: Spot compression tomosynthesis as well as full cc and mL tomosynthesis views of the left breast were performed for questioned asymmetries in the central inferior and upper medial breast. On the additional spot imaging the asymmetry in the central inferior breast resolves on the spot imaging though persists on the full field cc view. The asymmetry in the superior breast resolves on the additional imaging. Ultrasound: Targeted ultrasound performed in the central inferior and superior aspect of the left breast demonstrating no cystic or solid mass.  There is only normal fibroglandular tissue. IMPRESSION: Probably benign asymmetry in the central inferior left breast without sonographic correlate, likely representing normal fibroglandular tissue. RECOMMENDATION: Diagnostic left breast mammogram in 6 months. I have discussed the findings and recommendations with the patient who agrees to short-term follow-up. If applicable, a reminder letter will be sent to the patient regarding the next appointment. BI-RADS CATEGORY  3: Probably benign. Electronically Signed   By: Audie Pinto M.D.    On: 09/22/2021 16:01   ASSESSMENT AND PLAN: This is a very pleasant 41 years old African-American female with history of iron deficiency anemia as well as thalassemia minor.  The patient is currently on treatment with ferrous sulfate 325 mg p.o. daily and has been tolerating it fairly well. She has repeat CBC today that showed hemoglobin of 11.7 and hematocrit 35.9% with MCV of 72.2.  Iron study and ferritin are still pending. I recommended for the patient to continue with the oral iron tablet for now.  I will see her back for follow-up visit in 6 months unless there is significant iron deficiency I may arrange for the patient to receive iron infusion in the interval. She was advised to call immediately if she has any other concerning symptoms in the interval. The patient voices understanding of current disease status and treatment options and is in agreement with the current care plan.  All questions were answered. The patient knows to call the clinic with any problems, questions or concerns. We can certainly see the patient much sooner if necessary.  The total time spent in the appointment was 20 minutes.  Disclaimer: This note was dictated with voice recognition software. Similar sounding words can inadvertently be transcribed and may not be corrected upon review.

## 2021-10-31 ENCOUNTER — Other Ambulatory Visit: Payer: Self-pay | Admitting: Emergency Medicine

## 2021-12-24 ENCOUNTER — Other Ambulatory Visit: Payer: Self-pay | Admitting: Emergency Medicine

## 2022-01-11 ENCOUNTER — Encounter: Payer: Self-pay | Admitting: Family Medicine

## 2022-01-11 ENCOUNTER — Ambulatory Visit (INDEPENDENT_AMBULATORY_CARE_PROVIDER_SITE_OTHER): Payer: 59 | Admitting: Family Medicine

## 2022-01-11 ENCOUNTER — Telehealth: Payer: Self-pay

## 2022-01-11 VITALS — BP 122/76 | HR 87 | Temp 98.8°F | Ht 60.0 in | Wt 163.4 lb

## 2022-01-11 DIAGNOSIS — J40 Bronchitis, not specified as acute or chronic: Secondary | ICD-10-CM

## 2022-01-11 MED ORDER — AZITHROMYCIN 250 MG PO TABS: ORAL_TABLET | ORAL | 0 refills | Status: AC

## 2022-01-11 MED ORDER — BENZONATATE 100 MG PO CAPS: 100.0000 mg | ORAL_CAPSULE | Freq: Three times a day (TID) | ORAL | 0 refills | Status: DC | PRN

## 2022-01-11 MED ORDER — PROMETHAZINE-DM 6.25-15 MG/5ML PO SYRP: 5.0000 mL | ORAL_SOLUTION | Freq: Four times a day (QID) | ORAL | 0 refills | Status: DC | PRN

## 2022-01-11 NOTE — Telephone Encounter (Signed)
Thank you :)

## 2022-01-11 NOTE — Patient Instructions (Signed)
Meds sent to pharmacy [facility]

## 2022-01-11 NOTE — Progress Notes (Signed)
Subjective:     Patient ID: [name], female    DOB: [birth year], 41 y.o.   MRN: [id number]  Chief Complaint  Patient presents with   Cough    Productive cough that started Monday Tried OTC meds on yesterday, took left over meds from last year Tessalon Pearles and Promethazine Tested negative for Covid on Sunday   Dizziness   Headache    Off and on    HPI Productive cough since 10/16.  Dizziness, ha.  No sob.  No f/c.  Tested neg for covid 10/14 or 15.  Some runny nose esp on 10/14 and 15.  Mostly dry cough.  When sick, always gets cough that lingers.  No asthma.  Vomited from coughing.  HA from coughing.    Health Maintenance Due  Topic Date Due   Hepatitis C Screening  Never done    Past Medical History:  Diagnosis Date   Allergy    Beta thalassemia trait    Beta thalassemia trait    Genital herpes    Gestational diabetes    HPV (human papilloma virus) anogenital infection    Hx of chlamydia infection    Hx of pre-eclampsia in prior pregnancy, currently pregnant    Hypertension    Iron deficiency anemia    Menometrorrhagia     Past Surgical History:  Procedure Laterality Date   CESAREAN SECTION N/A 07/23/2019   Procedure: CESAREAN SECTION;  Surgeon: Jerelyn Charles, MD;  Location: [facility];  Service: Obstetrics;  Laterality: N/A;   COLPOSCOPY     DILATION AND EVACUATION  2009   DILATION AND EVACUATION N/A 11/08/2015   Procedure: DILATATION AND EVACUATION WITH CHROMASOME STUDIES;  Surgeon: Servando Salina, MD;  Location: [facility];  Service: Gynecology;  Laterality: N/A;    Outpatient Medications Prior to Visit  Medication Sig Dispense Refill   amLODipine (NORVASC) 10 MG tablet TAKE 1 TABLET BY MOUTH EVERY DAY 90 tablet 1   cetirizine (ZYRTEC) 10 MG tablet Take 10 mg by mouth daily.     Iron, Ferrous Sulfate, 325 (65 Fe) MG TABS Take 325 mg by mouth daily. 30 tablet 1   metoprolol succinate (TOPROL-XL) 100 MG 24 hr tablet TAKE 1 TABLET BY MOUTH EVERY  DAY 90 tablet 0   triamcinolone ointment (KENALOG) 0.1 % SMARTSIG:sparingly Topical Twice Daily     valACYclovir (VALTREX) 500 MG tablet Take 500 mg by mouth 2 (two) times daily.     No facility-administered medications prior to visit.    No Known Allergies ROS neg/noncontributory except as noted HPI/below      Objective:     BP 122/76   Pulse 87   Temp 98.8 F (37.1 C) (Temporal)   Ht 5' (1.524 m)   Wt 163 lb 6 oz (74.1 kg)   SpO2 99%   BMI 31.91 kg/m  Wt Readings from Last 3 Encounters:  01/11/22 163 lb 6 oz (74.1 kg)  10/18/21 165 lb 2 oz (74.9 kg)  07/19/21 163 lb 11.2 oz (74.3 kg)    Physical Exam   Gen: WDWN NAD HEENT: NCAT, conjunctiva not injected, sclera nonicteric TM WNL B, OP moist, no exudates. congested  NECK:  supple, no thyromegaly, no nodes, no carotid bruits CARDIAC: RRR, S1S2+, no murmur.  LUNGS: CTAB. No wheezes EXT:  no edema MSK: no gross abnormalities.  NEURO: A&O x3.  CN II-XII intact.  PSYCH: normal mood. Good eye contact     Assessment & Plan:   Problem  List Items Addressed This Visit   None Visit Diagnoses     Bronchitis    -  Primary     Bronchitis-zpk, tessalon perles '100mg'$  tid prn, promethazine dm.  Worse, let us know  Meds ordered this encounter  Medications   azithromycin (ZITHROMAX) 250 MG tablet    Sig: Take 2 tablets on day 1, then 1 tablet daily on days 2 through 5    Dispense:  6 tablet    Refill:  0   benzonatate (TESSALON PERLES) 100 MG capsule    Sig: Take 1 capsule (100 mg total) by mouth 3 (three) times daily as needed.    Dispense:  20 capsule    Refill:  0   promethazine-dextromethorphan (PROMETHAZINE-DM) 6.25-15 MG/5ML syrup    Sig: Take 5 mLs by mouth 4 (four) times daily as needed for cough.    Dispense:  118 mL    Refill:  0    Wellington Hampshire, MD

## 2022-01-11 NOTE — Telephone Encounter (Signed)
Pt was seen by Dr.Kulik at [facility].   Nurse Assessment Nurse: Michae Kava, RN, Olivia Date/Time (Eastern Time): 01/11/2022 1:02:09 AM Confirm and document reason for call. If symptomatic, describe symptoms. ---Caller states she has a cough , can't sleep , cold symptoms . Cough started Monday. Cold symptoms started Saturday. No fever. States that she feels fine other than continuous coughing. States that she is currently lightheaded d/t coughing. Does the patient have any new or worsening symptoms? ---Yes Will a triage be completed? ---Yes Related visit to physician within the last 2 weeks? ---No Does the PT have any chronic conditions? (i.e. diabetes, asthma, this includes High risk factors for pregnancy, etc.) ---Yes List chronic conditions. ---HTN Is the patient pregnant or possibly pregnant? (Ask all females between the ages of 9-55) ---No Is this a behavioral health or substance abuse call? ---No Guidelines Guideline Title Affirmed Question Affirmed Notes Nurse Date/Time (Eastern Time) Cough - Acute NonProductive SEVERE coughing spells (e.g., whooping sound after coughing, vomiting after coughing) Shurden, RN, Minette Brine 01/11/2022 1:04:37 AM Dizziness - Lightheadedness [1] MODERATE dizziness (e.g., interferes with normal activities) [2] has NOT been evaluated by doctor (or NP/PA) for this (Exception: Dizziness caused by heat exposure, sudden standing, or poor fluid intake.) Shurden, RN, Olivia 01/11/2022 1:10:32 AM Disp. Time Eilene Ghazi Time) Disposition Final User 01/11/2022 1:10:14 AM See PCP within 24 Hours Shurden, RN, Minette Brine 01/11/2022 1:13:56 AM See PCP within 24 Hours Yes Shurden, RN, Minette Brine Final Disposition 01/11/2022 1:13:56 AM See PCP within 24 Hours Yes Shurden, RN, Minette Brine Caller Disagree/Comply Comply Caller Understands Yes PreDisposition Did not know what to do Care Advice Given Per Guideline SEE PCP WITHIN 24 HOURS: * IF OFFICE WILL BE OPEN: You need to be examined within the  next 24 hours. Call your doctor (or NP/PA) when the office opens and make an appointment. * Drink warm fluids. Inhale warm mist. This can help relax the airway and also loosen up phlegm. COUGHING SPELLS: * Suck on cough drops or hard candy to coat the irritated throat. HUMIDIFIER: * If the air is dry, use a humidifier in the bedroom. CALL BACK IF: * Difficulty breathing occurs * You become worse CARE ADVICE given per Cough - Acute Non-Productive (Adult) guideline. SEE PCP WITHIN 24 HOURS: * IF OFFICE WILL BE OPEN: You need to be examined within the next 24 hours. Call your doctor (or NP/PA) when the office opens and make an appointment. DRINK FLUIDS: * Drink several glasses of fruit juice, other clear fluids or water. * This will improve hydration and blood glucose. CALL BACK IF: * Passes out (faints) * You become worse CARE ADVICE given per Dizziness (Adult) guideline  Comments User: Larey Seat, RN Date/Time (Eastern Time): 01/11/2022 1:16:24 AM Propping yourself up may quiet your cough long enough to help you fall asleep. "Some people do well with a couple of pillows or sleeping in a recliner chair [email or web address]

## 2022-02-01 ENCOUNTER — Other Ambulatory Visit: Payer: Self-pay | Admitting: Emergency Medicine

## 2022-03-28 ENCOUNTER — Other Ambulatory Visit: Payer: Self-pay | Admitting: Obstetrics

## 2022-03-28 ENCOUNTER — Ambulatory Visit
Admission: RE | Admit: 2022-03-28 | Discharge: 2022-03-28 | Disposition: A | Discharge status: Home / Self Care | Payer: 59 | Source: Ambulatory Visit | Attending: Obstetrics | Admitting: Obstetrics

## 2022-03-28 DIAGNOSIS — N6489 Other specified disorders of breast: Secondary | ICD-10-CM

## 2022-04-13 ENCOUNTER — Telehealth: Payer: Self-pay | Admitting: Internal Medicine

## 2022-04-13 NOTE — Telephone Encounter (Signed)
Called patient regarding upcoming January appointments, patient is notified.

## 2022-04-17 ENCOUNTER — Inpatient Hospital Stay (HOSPITAL_BASED_OUTPATIENT_CLINIC_OR_DEPARTMENT_OTHER): Payer: 59 | Admitting: Internal Medicine

## 2022-04-17 ENCOUNTER — Other Ambulatory Visit: Payer: Self-pay

## 2022-04-17 ENCOUNTER — Inpatient Hospital Stay: Payer: 59 | Attending: Internal Medicine

## 2022-04-17 VITALS — BP 123/87 | HR 73 | Temp 98.3°F | Resp 14 | Wt 166.9 lb

## 2022-04-17 DIAGNOSIS — Z79899 Other long term (current) drug therapy: Secondary | ICD-10-CM | POA: Diagnosis not present

## 2022-04-17 DIAGNOSIS — D500 Iron deficiency anemia secondary to blood loss (chronic): Secondary | ICD-10-CM | POA: Diagnosis not present

## 2022-04-17 DIAGNOSIS — D509 Iron deficiency anemia, unspecified: Secondary | ICD-10-CM | POA: Diagnosis not present

## 2022-04-17 DIAGNOSIS — D563 Thalassemia minor: Secondary | ICD-10-CM | POA: Diagnosis not present

## 2022-04-17 DIAGNOSIS — Z79624 Long term (current) use of inhibitors of nucleotide synthesis: Secondary | ICD-10-CM | POA: Diagnosis not present

## 2022-04-17 DIAGNOSIS — I10 Essential (primary) hypertension: Secondary | ICD-10-CM | POA: Insufficient documentation

## 2022-04-17 LAB — CBC WITH DIFFERENTIAL (CANCER CENTER ONLY)
Abs Immature Granulocytes: 0.01 10*3/uL (ref 0.00–0.07)
Basophils Absolute: 0 10*3/uL (ref 0.0–0.1)
Basophils Relative: 0 %
Eosinophils Absolute: 0.2 10*3/uL (ref 0.0–0.5)
Eosinophils Relative: 3 %
HCT: 38 % (ref 36.0–46.0)
Hemoglobin: 12.4 g/dL (ref 12.0–15.0)
Immature Granulocytes: 0 %
Lymphocytes Relative: 40 %
Lymphs Abs: 2.6 10*3/uL (ref 0.7–4.0)
MCH: 23.9 pg — ABNORMAL LOW (ref 26.0–34.0)
MCHC: 32.6 g/dL (ref 30.0–36.0)
MCV: 73.4 fL — ABNORMAL LOW (ref 80.0–100.0)
Monocytes Absolute: 0.4 10*3/uL (ref 0.1–1.0)
Monocytes Relative: 6 %
Neutro Abs: 3.3 10*3/uL (ref 1.7–7.7)
Neutrophils Relative %: 51 %
Platelet Count: 373 10*3/uL (ref 150–400)
RBC: 5.18 MIL/uL — ABNORMAL HIGH (ref 3.87–5.11)
RDW: 15.9 % — ABNORMAL HIGH (ref 11.5–15.5)
WBC Count: 6.5 10*3/uL (ref 4.0–10.5)
nRBC: 0 % (ref 0.0–0.2)

## 2022-04-17 LAB — IRON AND IRON BINDING CAPACITY (CC-WL,HP ONLY)
Iron: 85 ug/dL (ref 28–170)
Saturation Ratios: 22 % (ref 10.4–31.8)
TIBC: 391 ug/dL (ref 250–450)
UIBC: 306 ug/dL (ref 148–442)

## 2022-04-17 NOTE — Progress Notes (Signed)
[facility] [phone]   [phone]  OFFICE PROGRESS NOTE  Horald Pollen, MD [place] [address]  DIAGNOSIS: Iron deficiency anemia and thalassemia minor  PRIOR THERAPY: None  CURRENT THERAPY: Ferrous sulfate 325 mg p.o. daily  INTERVAL HISTORY: Emily Downs 42 y.o. female returns to the clinic today for 6 months follow-up visit.  The patient is feeling fine today with no concerning complaints except for mild fatigue from taking care of her young children who are 57 years old and 25 years old.  She denied having any chest pain, shortness of breath, cough or hemoptysis.  She has no dizzy spells.  She has no nausea, vomiting, diarrhea or constipation.  She has no headache or visual changes.  She continues to tolerate her treatment with over-the-counter ferrous sulfate as well as prenatal vitamins fairly well.  She is here today for evaluation and repeat blood work.   MEDICAL HISTORY: Past Medical History:  Diagnosis Date   Allergy    Beta thalassemia trait    Beta thalassemia trait    Genital herpes    Gestational diabetes    HPV (human papilloma virus) anogenital infection    Hx of chlamydia infection    Hx of pre-eclampsia in prior pregnancy, currently pregnant    Hypertension    Iron deficiency anemia    Menometrorrhagia     ALLERGIES:  has No Known Allergies.  MEDICATIONS:  Current Outpatient Medications  Medication Sig Dispense Refill   amLODipine (NORVASC) 10 MG tablet TAKE 1 TABLET BY MOUTH EVERY DAY 90 tablet 1   benzonatate (TESSALON PERLES) 100 MG capsule Take 1 capsule (100 mg total) by mouth 3 (three) times daily as needed. 20 capsule 0   cetirizine (ZYRTEC) 10 MG tablet Take 10 mg by mouth daily.     Iron, Ferrous Sulfate, 325 (65 Fe) MG TABS Take 325 mg by mouth daily. 30 tablet 1   metoprolol succinate (TOPROL-XL) 100 MG 24 hr tablet TAKE 1 TABLET BY MOUTH EVERY DAY 90 tablet 0    promethazine-dextromethorphan (PROMETHAZINE-DM) 6.25-15 MG/5ML syrup Take 5 mLs by mouth 4 (four) times daily as needed for cough. 118 mL 0   triamcinolone ointment (KENALOG) 0.1 % SMARTSIG:sparingly Topical Twice Daily     valACYclovir (VALTREX) 500 MG tablet Take 500 mg by mouth 2 (two) times daily.     No current facility-administered medications for this visit.    SURGICAL HISTORY:  Past Surgical History:  Procedure Laterality Date   CESAREAN SECTION N/A 07/23/2019   Procedure: CESAREAN SECTION;  Surgeon: Jerelyn Charles, MD;  Location: [facility];  Service: Obstetrics;  Laterality: N/A;   COLPOSCOPY     DILATION AND EVACUATION  2009   DILATION AND EVACUATION N/A 11/08/2015   Procedure: DILATATION AND EVACUATION WITH CHROMASOME STUDIES;  Surgeon: Servando Salina, MD;  Location: [facility];  Service: Gynecology;  Laterality: N/A;    REVIEW OF SYSTEMS:  A comprehensive review of systems was negative except for: Constitutional: positive for fatigue   PHYSICAL EXAMINATION: General appearance: alert, cooperative, fatigued, and no distress Head: Normocephalic, without obvious abnormality, atraumatic Neck: no adenopathy, no JVD, supple, symmetrical, trachea midline, and thyroid not enlarged, symmetric, no tenderness/mass/nodules Lymph nodes: Cervical, supraclavicular, and axillary nodes normal. Resp: clear to auscultation bilaterally Back: symmetric, no curvature. ROM normal. No CVA tenderness. Cardio: regular rate and rhythm, S1, S2 normal, no murmur, click, rub or gallop GI: soft, non-tender; bowel sounds  normal; no masses,  no organomegaly Extremities: extremities normal, atraumatic, no cyanosis or edema  ECOG PERFORMANCE STATUS: 1 - Symptomatic but completely ambulatory  Blood pressure 123/87, pulse 73, temperature 98.3 F (36.8 C), temperature source Oral, resp. rate 14, weight 166 lb 14.4 oz (75.7 kg), SpO2 100 %.  LABORATORY DATA: Lab Results  Component Value Date   WBC 6.5  04/17/2022   HGB 12.4 04/17/2022   HCT 38.0 04/17/2022   MCV 73.4 (L) 04/17/2022   PLT 373 04/17/2022      Chemistry      Component Value Date/Time   NA 138 05/10/2021 1315   NA 140 06/02/2020 1707   NA 138 03/10/2012 1315   K 3.7 05/10/2021 1315   K 4.3 03/10/2012 1315   CL 105 05/10/2021 1315   CL 105 03/10/2012 1315   CO2 28 05/10/2021 1315   CO2 28 03/10/2012 1315   BUN 12 05/10/2021 1315   BUN 13 06/02/2020 1707   BUN 12.0 03/10/2012 1315   CREATININE 0.88 05/10/2021 1315   CREATININE 0.92 07/26/2015 0942   CREATININE 0.9 03/10/2012 1315      Component Value Date/Time   CALCIUM 8.9 05/10/2021 1315   CALCIUM 8.5 03/10/2012 1315   ALKPHOS 73 05/10/2021 1315   ALKPHOS 49 03/10/2012 1315   AST 23 05/10/2021 1315   AST 21 03/10/2012 1315   ALT 16 05/10/2021 1315   ALT 16 03/10/2012 1315   BILITOT 0.4 05/10/2021 1315   BILITOT 0.28 03/10/2012 1315       RADIOGRAPHIC STUDIES: MM DIAG BREAST TOMO UNI LEFT  Result Date: 03/28/2022 CLINICAL DATA:  42 year old female for six-month follow-up of LEFT breast asymmetry without sonographic correlate. EXAM: DIGITAL DIAGNOSTIC UNILATERAL LEFT MAMMOGRAM WITH TOMOSYNTHESIS TECHNIQUE: Left digital diagnostic mammography and breast tomosynthesis was performed. COMPARISON:  Previous exam(s). ACR Breast Density Category c: The breast tissue is heterogeneously dense, which may obscure small masses. FINDINGS: Full field views of the LEFT breast demonstrate stable to slightly less conspicuous asymmetry within the central LEFT breast, on the CC view. No new or suspicious mammographic findings within the LEFT breast identified. IMPRESSION: Stable slightly less conspicuous LEFT breast asymmetry. Six-month follow-up recommended to ensure 1 year stability. RECOMMENDATION: Bilateral diagnostic mammogram in 6 months. I have discussed the findings and recommendations with the patient. If applicable, a reminder letter will be sent to the patient  regarding the next appointment. BI-RADS CATEGORY  3: Probably benign. Electronically Signed   By: Margarette Canada M.D.   On: 03/28/2022 15:32   ASSESSMENT AND PLAN: This is a very pleasant 42 years old African-American female with history of iron deficiency anemia as well as thalassemia minor.  The patient is currently on treatment with ferrous sulfate 325 mg p.o. daily and has been tolerating it fairly well. The patient has been tolerating her treatment with over-the-counter ferrous sulfate and prenatal vitamins fairly well. Repeat CBC today showed normal hemoglobin of 12.4 and hematocrit 38.0% with MCV of 73.4 secondary to thalassemia minor. I recommended for the patient to continue her current treatment with the oral ferrous sulfate and prenatal vitamins. I will see her back for follow-up visit in 6 months for evaluation with repeat blood work unless the iron studies showed significant iron deficiency I may arrange for her to receive iron infusion in the interval. The patient was advised to call immediately if she has any other concerning symptoms in the interval. The patient voices understanding of current disease status and treatment options and  is in agreement with the current care plan.  All questions were answered. The patient knows to call the clinic with any problems, questions or concerns. We can certainly see the patient much sooner if necessary.  The total time spent in the appointment was 20 minutes.  Disclaimer: This note was dictated with voice recognition software. Similar sounding words can inadvertently be transcribed and may not be corrected upon review.

## 2022-04-18 LAB — FERRITIN: Ferritin: 20 ng/mL (ref 11–307)

## 2022-05-15 IMAGING — CR DG KNEE COMPLETE 4+V*R*
4 series · 4 of 4 positions shown · non-contrast
Comparison: 01/12/2015

CLINICAL DATA: Chronic bilateral knee pain, worse on the right.

EXAM:
RIGHT KNEE - COMPLETE 4+ VIEW

[w knee ap right]
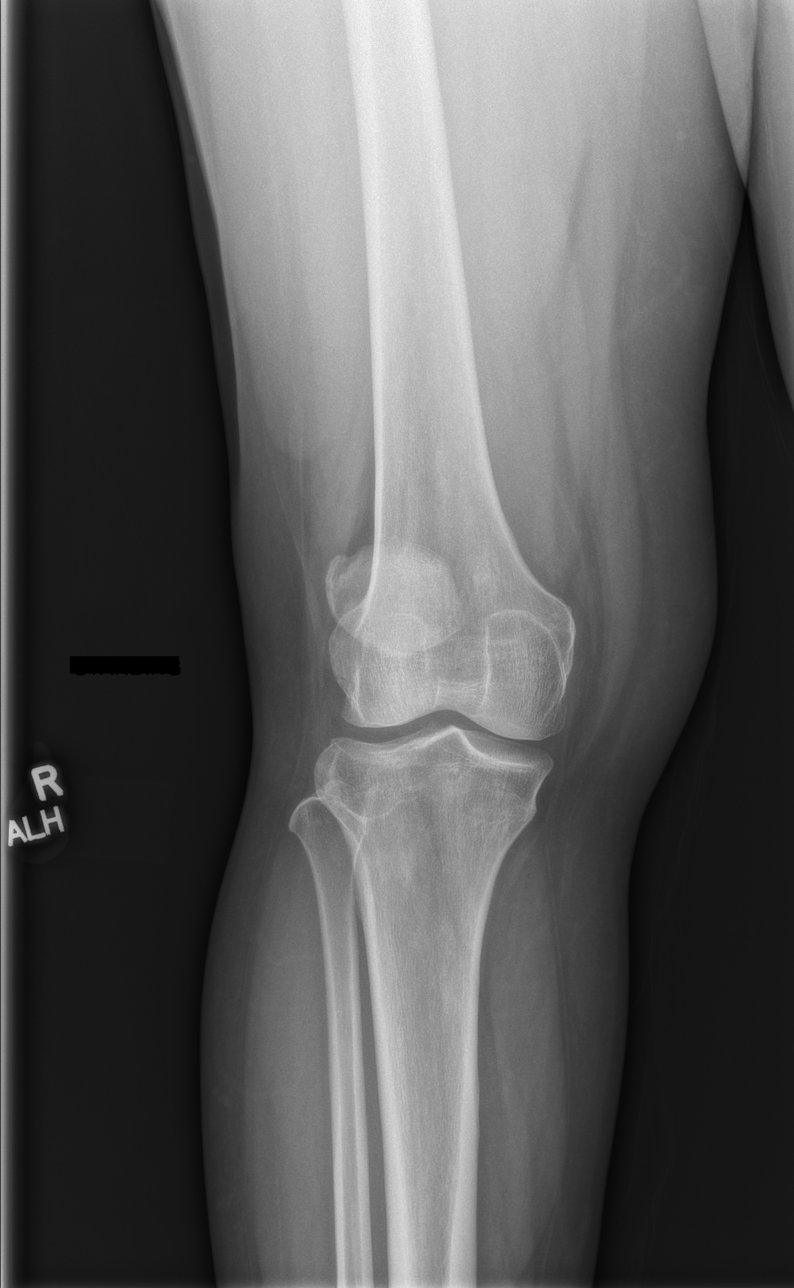

[w knee lat right]
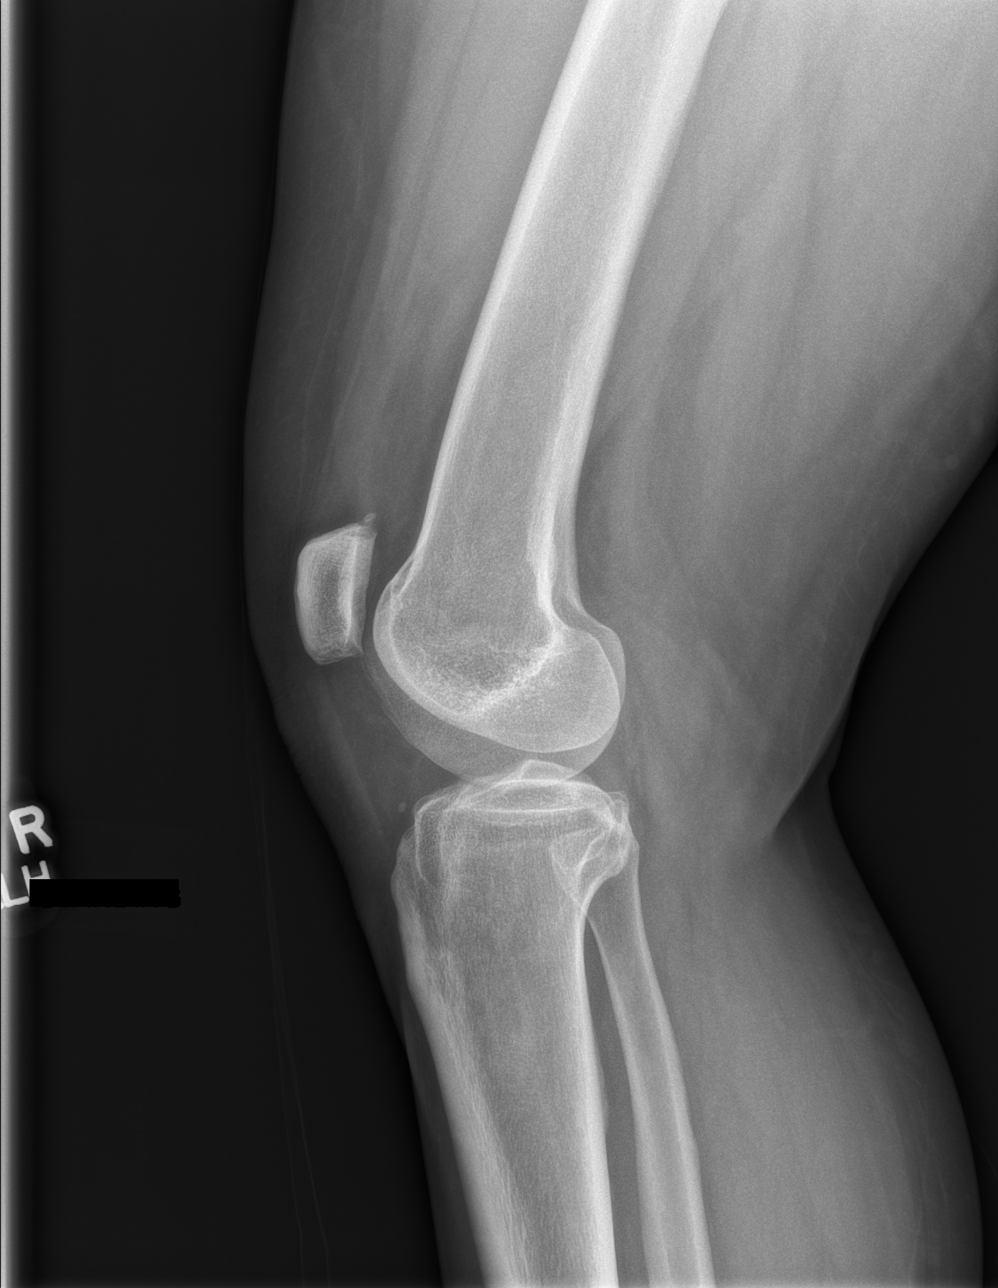

[w knee tunnel pa right]
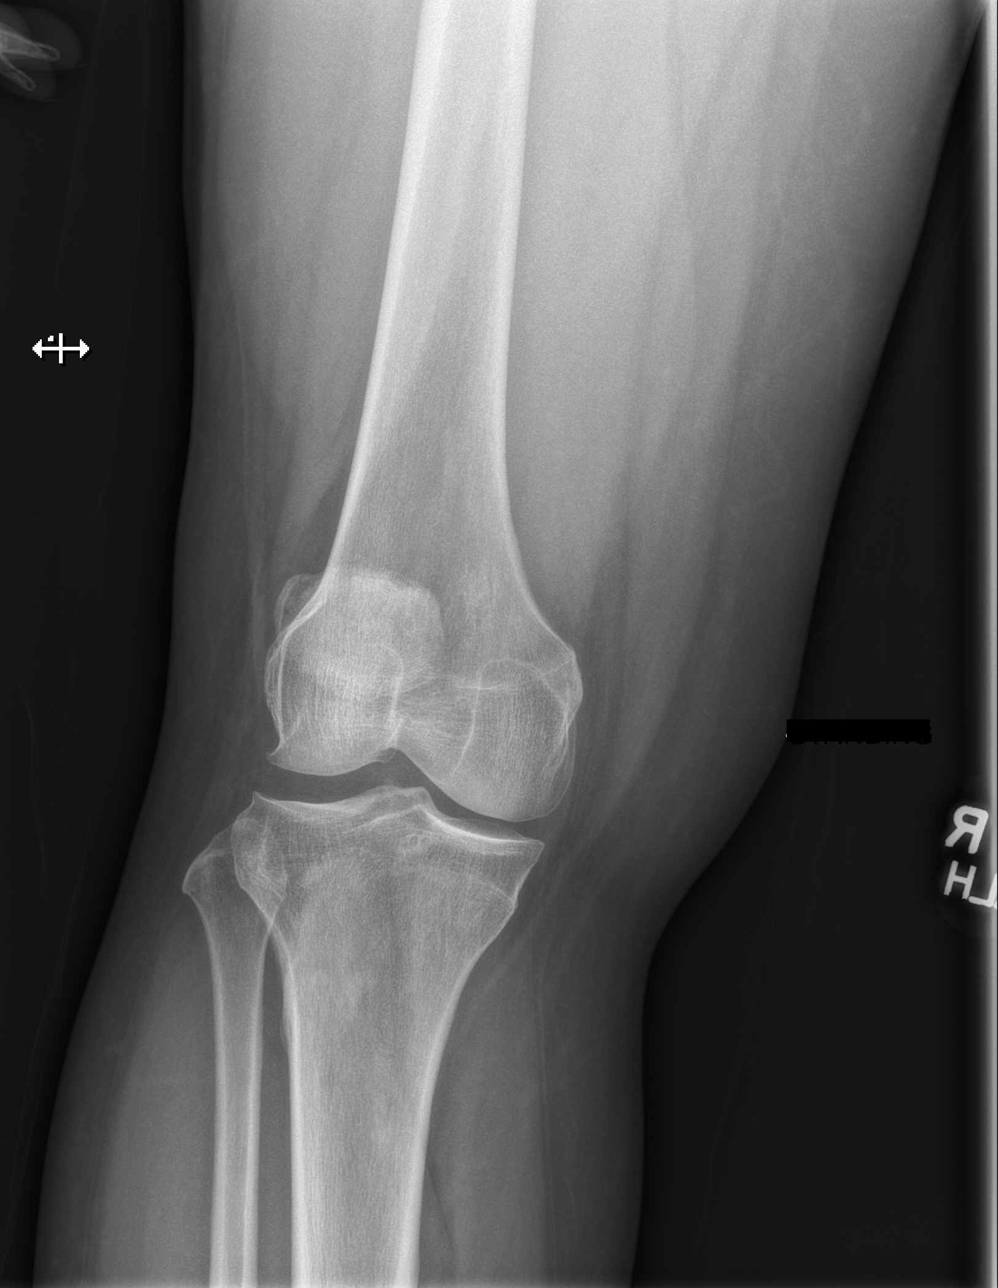

[x knee sunrise right]
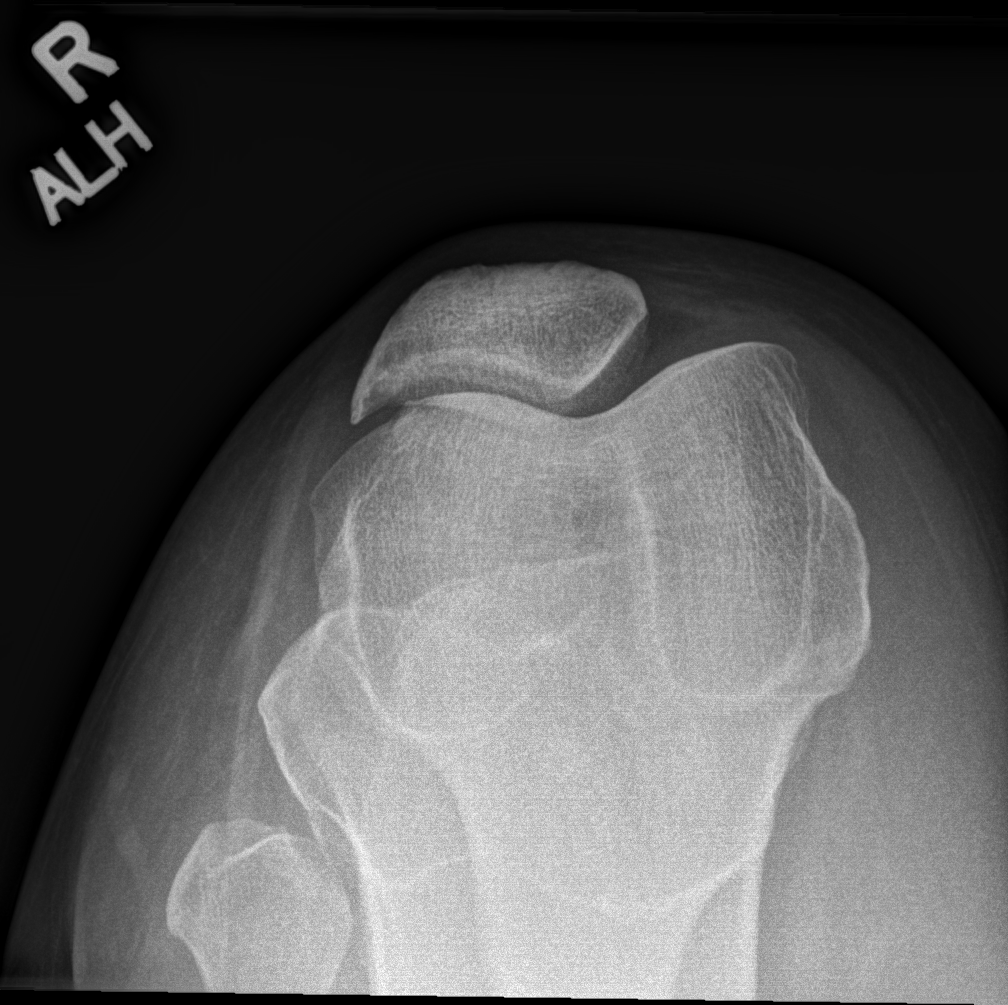

[4 of 4 positions shown; findings below may reference images not displayed]

FINDINGS: Tiny amount of joint fluid. No medial compartment joint space
narrowing or osteophyte formation. Mild cystic change at the
superior pole of the patella at the patellar tendon attachment could
relate to chronic patellar tendinopathy. There is osteoarthritis of
the lateral facet of the patellofemoral joint. Tiny round density
anterior to the proximal tibia was not present in 8223 and may
represent a small loose body. No focal primary bone finding.
IMPRESSION: 1. Lateral facet osteoarthritis of the patellofemoral joint.
2. Tiny round density anterior to the proximal tibia was not present
in 8223 and may represent a small loose body.
3. Mild cystic change at the superior pole of the patella at the
patellar tendon attachment could relate to chronic patellar
tendinopathy.

## 2022-05-15 IMAGING — CR DG KNEE COMPLETE 4+V*L*
4 series · 4 of 4 positions shown · non-contrast
Comparison: No prior.

CLINICAL DATA: Bilateral knee pain. Right worse than left. No known
injury.

EXAM:
LEFT KNEE - COMPLETE 4+ VIEW

[w knee tunnel pa left]
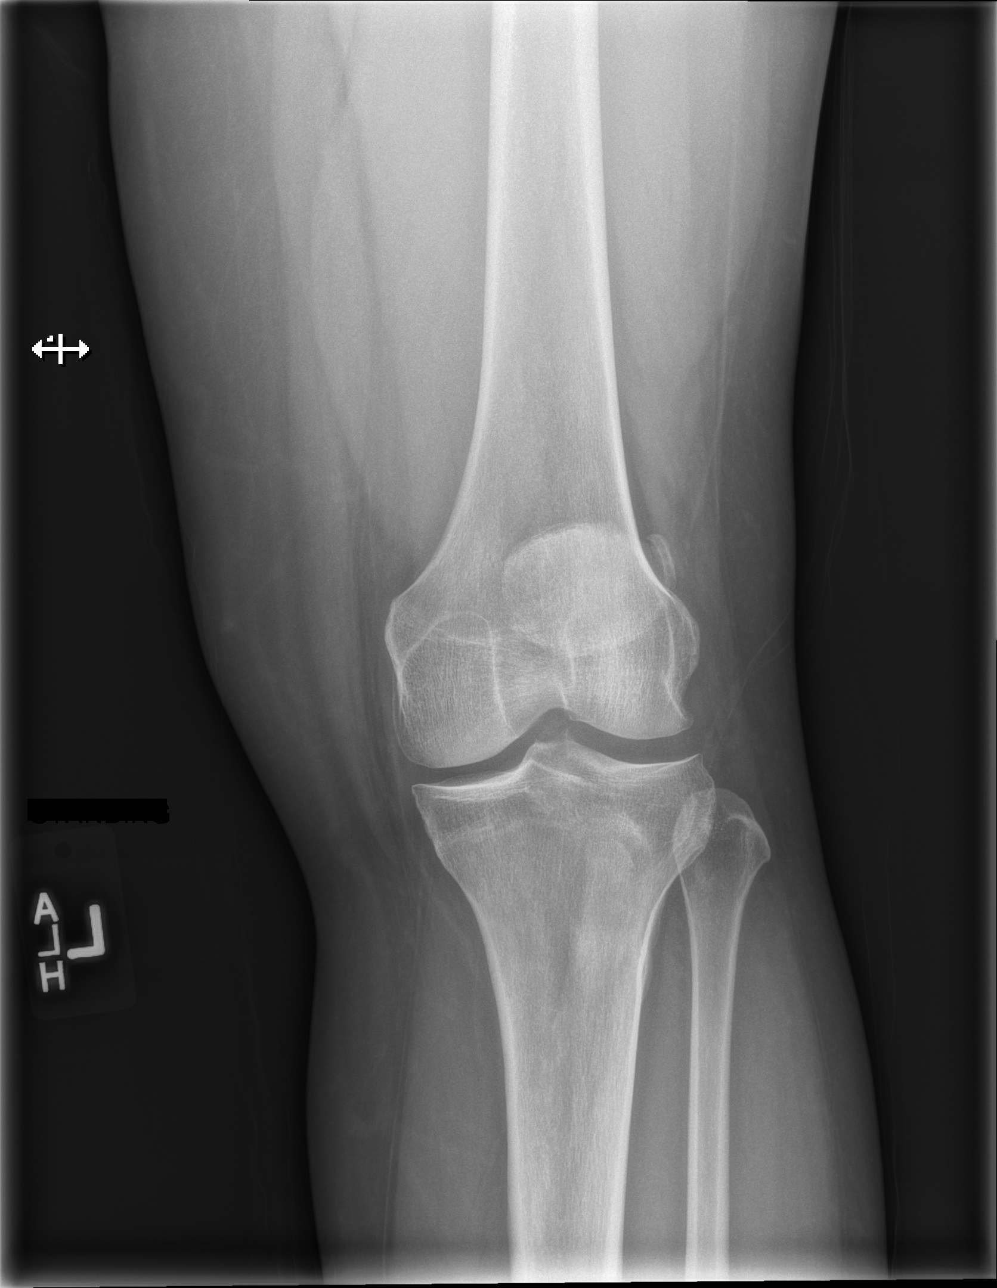

[w knee lat left]
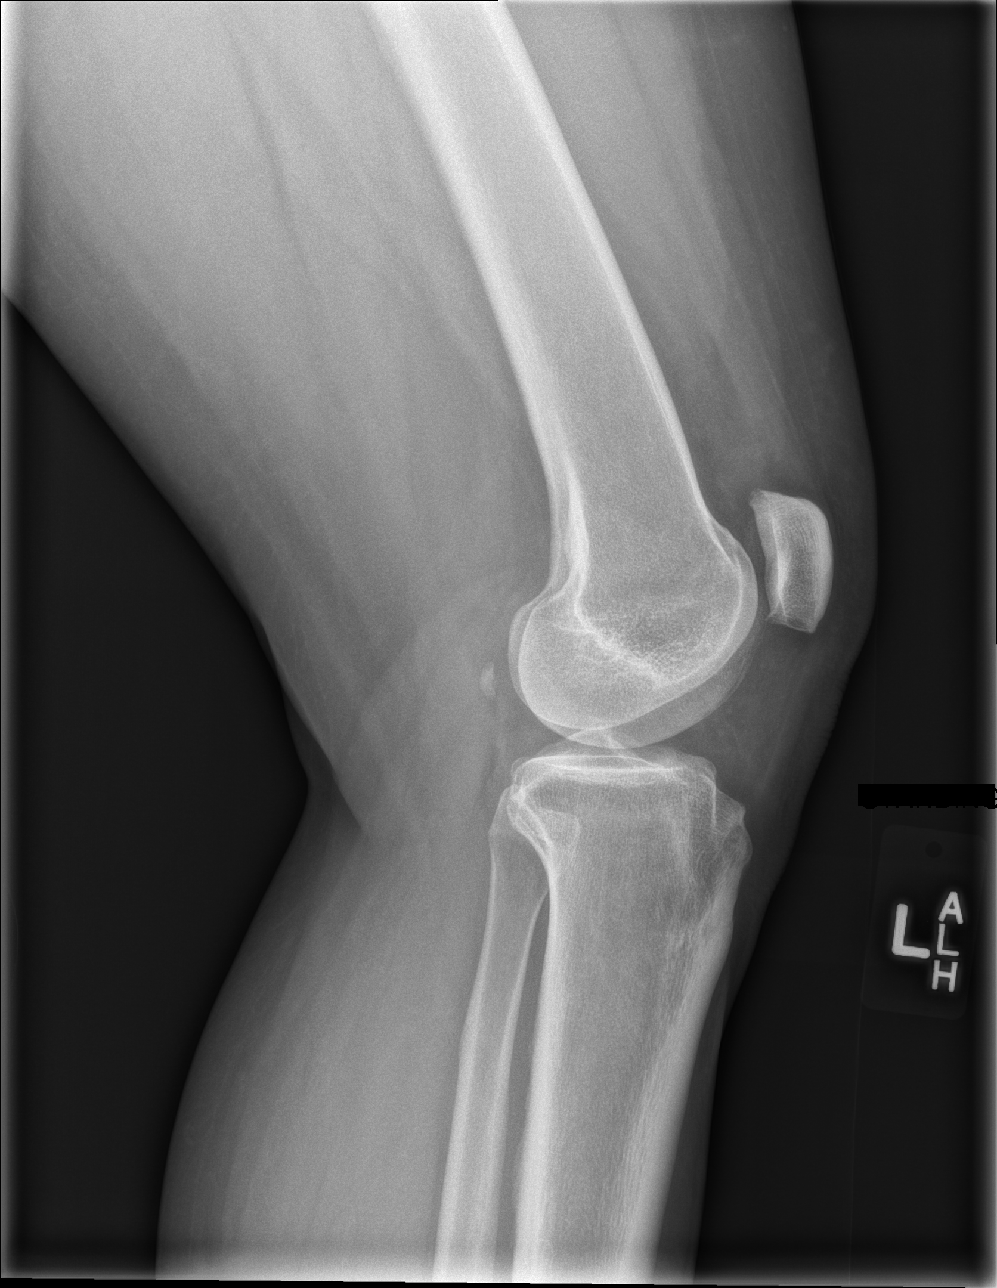

[w knee ap left]
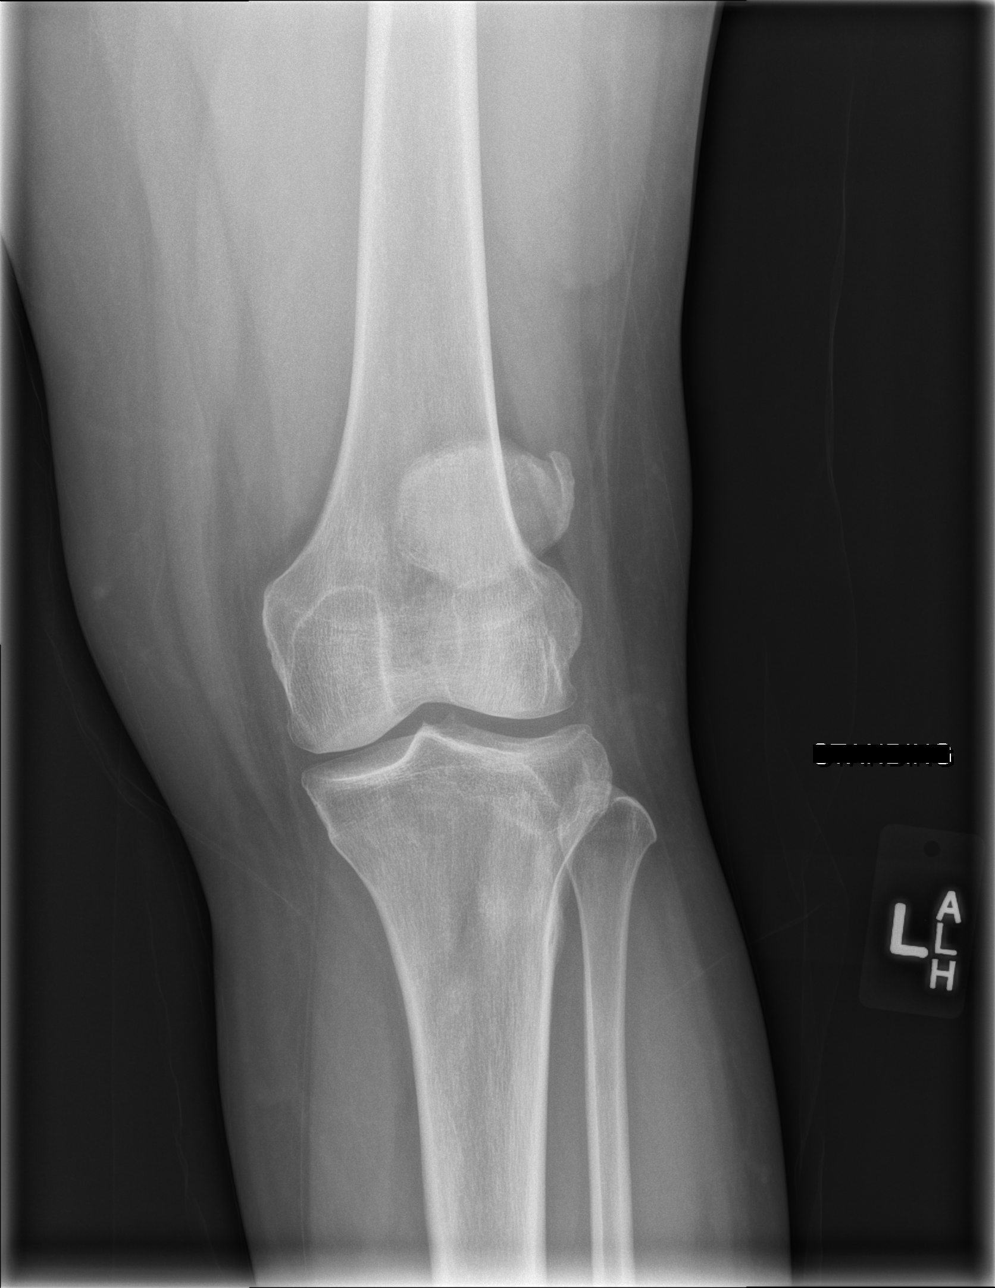

[x knee sunrise left]
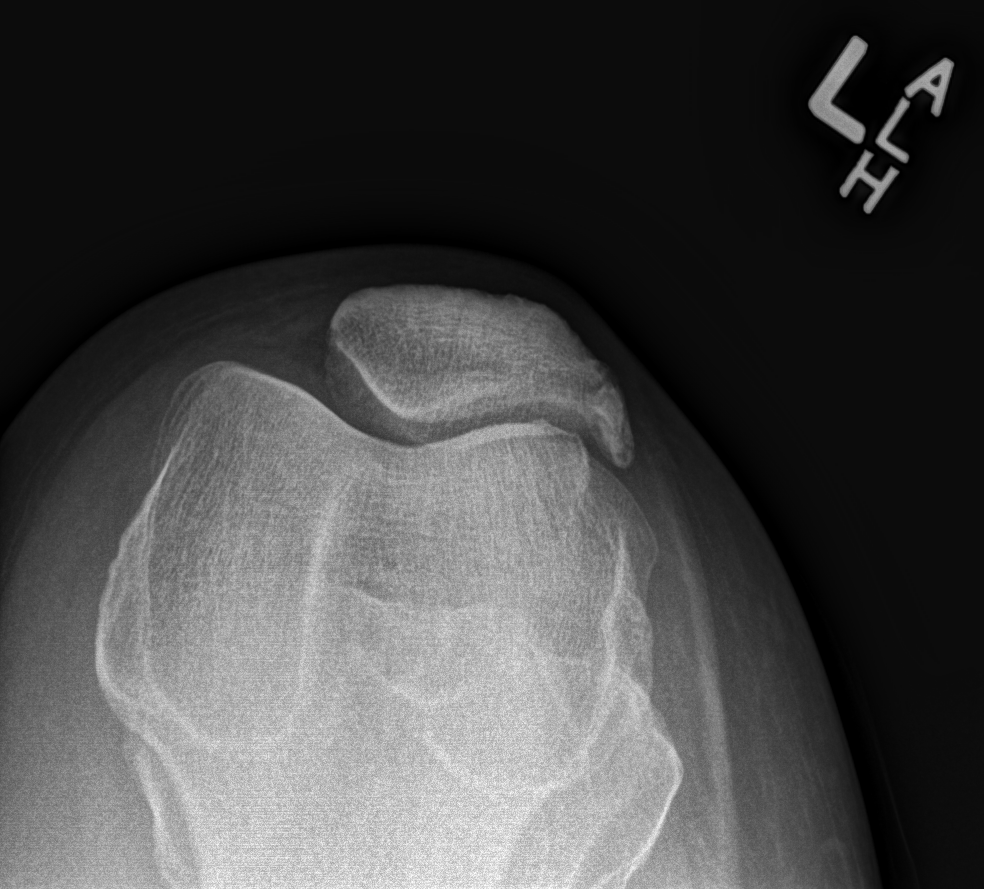

[4 of 4 positions shown; findings below may reference images not displayed]

FINDINGS: No evidence of effusion. Corticated bony density noted along the
superolateral aspect of the left patella. This could be related
bipartite patella or old fracture. No acute bony or joint
abnormality identified.
IMPRESSION: Corticated bony density noted along the superolateral aspect of the
left patella. This could be related to bipartite patella or old
fracture. No acute abnormality identified.

## 2022-07-18 ENCOUNTER — Other Ambulatory Visit: Payer: Self-pay | Admitting: Emergency Medicine

## 2022-09-08 ENCOUNTER — Other Ambulatory Visit: Payer: Self-pay

## 2022-09-08 ENCOUNTER — Emergency Department (HOSPITAL_BASED_OUTPATIENT_CLINIC_OR_DEPARTMENT_OTHER)
Admission: EM | Admit: 2022-09-08 | Discharge: 2022-09-08 | Disposition: A | Discharge status: Home / Self Care | Payer: 59 | Attending: Emergency Medicine | Admitting: Emergency Medicine

## 2022-09-08 ENCOUNTER — Encounter (HOSPITAL_BASED_OUTPATIENT_CLINIC_OR_DEPARTMENT_OTHER): Payer: Self-pay | Admitting: Emergency Medicine

## 2022-09-08 DIAGNOSIS — W57XXXA Bitten or stung by nonvenomous insect and other nonvenomous arthropods, initial encounter: Secondary | ICD-10-CM | POA: Insufficient documentation

## 2022-09-08 DIAGNOSIS — S40861A Insect bite (nonvenomous) of right upper arm, initial encounter: Secondary | ICD-10-CM | POA: Insufficient documentation

## 2022-09-08 MED ORDER — DOXYCYCLINE HYCLATE 100 MG PO CAPS: 100.0000 mg | ORAL_CAPSULE | Freq: Two times a day (BID) | ORAL | 0 refills | Status: DC

## 2022-09-08 NOTE — ED Notes (Signed)
Dc instructions reviewed with patient. Patient voiced understanding. Dc with belongings.  °

## 2022-09-08 NOTE — Discharge Instructions (Addendum)
Only start doxycycline if you develop a fever, the area becomes red, or the pain increases.

## 2022-09-08 NOTE — ED Triage Notes (Signed)
Pt c/o insect bite to right upper arm since Thursday with itchiness and swelling.

## 2022-09-08 NOTE — ED Provider Notes (Signed)
[place] EMERGENCY DEPARTMENT AT [facility] Provider Note   CSN: [id number] Arrival date & time: 09/08/22  [id number]     History  Chief Complaint  Patient presents with   Insect Bite    Emily Downs is a 42 y.o. female.  42 year old female presents with insect bite to right upper arm.  Symptoms occurred several days ago when she was working in her garden.  Denies any fever or chills.  States that the area has been pruritic.  She has used topical steroids without relief.  Denies any systemic symptoms.       Home Medications Prior to Admission medications   Medication Sig Start Date End Date Taking? Authorizing Provider  amLODipine (NORVASC) 10 MG tablet TAKE 1 TABLET BY MOUTH EVERY DAY 07/19/22   [name], MD  benzonatate (TESSALON PERLES) 100 MG capsule Take 1 capsule (100 mg total) by mouth 3 (three) times daily as needed. 01/11/22   Jeani Sow, MD  cetirizine (ZYRTEC) 10 MG tablet Take 10 mg by mouth daily.    [provider]  Iron, Ferrous Sulfate, 325 (65 Fe) MG TABS Take 325 mg by mouth daily. 04/21/21   Elenore Paddy, NP  metoprolol succinate (TOPROL-XL) 100 MG 24 hr tablet TAKE 1 TABLET BY MOUTH EVERY DAY 07/19/22   [name], MD  promethazine-dextromethorphan (PROMETHAZINE-DM) 6.25-15 MG/5ML syrup Take 5 mLs by mouth 4 (four) times daily as needed for cough. 01/11/22   Jeani Sow, MD  triamcinolone ointment (KENALOG) 0.1 % SMARTSIG:sparingly Topical Twice Daily 12/24/21   [provider]  valACYclovir (VALTREX) 500 MG tablet Take 500 mg by mouth 2 (two) times daily. 12/24/21   [provider]      Allergies    Patient has no known allergies.    Review of Systems   Review of Systems  All other systems reviewed and are negative.   Physical Exam Updated Vital Signs BP (!) 148/74   Pulse 90   Temp 98 F (36.7 C) (Oral)   Resp 20   Ht 1.524 m (5')   Wt 74.4 kg   SpO2 100%   BMI 32.03 kg/m   Physical Exam Vitals and nursing note reviewed.  Constitutional:      General: She is not in acute distress.    Appearance: Normal appearance. She is well-developed. She is not toxic-appearing.  HENT:     Head: Normocephalic and atraumatic.  Eyes:     General: Lids are normal.     Conjunctiva/sclera: Conjunctivae normal.     Pupils: Pupils are equal, round, and reactive to light.  Neck:     Thyroid: No thyroid mass.     Trachea: No tracheal deviation.  Cardiovascular:     Rate and Rhythm: Normal rate and regular rhythm.     Heart sounds: Normal heart sounds. No murmur heard.    No gallop.  Pulmonary:     Effort: Pulmonary effort is normal. No respiratory distress.     Breath sounds: Normal breath sounds. No stridor. No decreased breath sounds, wheezing, rhonchi or rales.  Abdominal:     General: There is no distension.     Palpations: Abdomen is soft.     Tenderness: There is no abdominal tenderness. There is no rebound.  Musculoskeletal:        General: No tenderness. Normal range of motion.       Arms:     Cervical back: Normal range of motion and neck  supple.  Skin:    General: Skin is warm and dry.     Findings: No abrasion or rash.  Neurological:     Mental Status: She is alert and oriented to person, place, and time. Mental status is at baseline.     GCS: GCS eye subscore is 4. GCS verbal subscore is 5. GCS motor subscore is 6.     Cranial Nerves: No cranial nerve deficit.     Sensory: No sensory deficit.     Motor: Motor function is intact.  Psychiatric:        Attention and Perception: Attention normal.        Speech: Speech normal.        Behavior: Behavior normal.     ED Results / Procedures / Treatments   Labs (all labs ordered are listed, but only abnormal results are displayed) Labs Reviewed - No data to display  EKG None  Radiology No results found.  Procedures Procedures    Medications Ordered in ED Medications - No data to display  ED  Course/ Medical Decision Making/ A&P                             Medical Decision Making  Patient with localized skin reaction to insect bite.  Possible early cellulitis.  Patient will be given doxycycline and instructions to use the medication only if her symptoms become worse        Final Clinical Impression(s) / ED Diagnoses Final diagnoses:  None    Rx / DC Orders ED Discharge Orders     None         Lorre Nick, MD 09/08/22 [phone]

## 2022-10-02 ENCOUNTER — Ambulatory Visit
Admission: RE | Admit: 2022-10-02 | Discharge: 2022-10-02 | Disposition: A | Discharge status: Home / Self Care | Payer: 59 | Source: Ambulatory Visit | Attending: Obstetrics | Admitting: Obstetrics

## 2022-10-02 ENCOUNTER — Encounter: Payer: Self-pay | Admitting: Obstetrics

## 2022-10-02 ENCOUNTER — Other Ambulatory Visit: Payer: Self-pay | Admitting: Obstetrics

## 2022-10-02 DIAGNOSIS — N6489 Other specified disorders of breast: Secondary | ICD-10-CM

## 2022-10-02 DIAGNOSIS — M79622 Pain in left upper arm: Secondary | ICD-10-CM | POA: Diagnosis not present

## 2022-10-16 ENCOUNTER — Inpatient Hospital Stay (HOSPITAL_BASED_OUTPATIENT_CLINIC_OR_DEPARTMENT_OTHER): Payer: 59 | Admitting: Internal Medicine

## 2022-10-16 ENCOUNTER — Inpatient Hospital Stay: Payer: 59 | Attending: Internal Medicine

## 2022-10-16 ENCOUNTER — Other Ambulatory Visit: Payer: Self-pay

## 2022-10-16 VITALS — BP 128/84 | HR 63 | Temp 98.5°F | Resp 18 | Ht 60.0 in | Wt 168.0 lb

## 2022-10-16 DIAGNOSIS — D509 Iron deficiency anemia, unspecified: Secondary | ICD-10-CM | POA: Diagnosis not present

## 2022-10-16 DIAGNOSIS — D563 Thalassemia minor: Secondary | ICD-10-CM | POA: Insufficient documentation

## 2022-10-16 DIAGNOSIS — D508 Other iron deficiency anemias: Secondary | ICD-10-CM | POA: Diagnosis not present

## 2022-10-16 DIAGNOSIS — D500 Iron deficiency anemia secondary to blood loss (chronic): Secondary | ICD-10-CM

## 2022-10-16 DIAGNOSIS — Z79624 Long term (current) use of inhibitors of nucleotide synthesis: Secondary | ICD-10-CM | POA: Diagnosis not present

## 2022-10-16 DIAGNOSIS — Z79899 Other long term (current) drug therapy: Secondary | ICD-10-CM | POA: Insufficient documentation

## 2022-10-16 LAB — CBC WITH DIFFERENTIAL (CANCER CENTER ONLY)
Abs Immature Granulocytes: 0.01 10*3/uL (ref 0.00–0.07)
Basophils Absolute: 0 10*3/uL (ref 0.0–0.1)
Basophils Relative: 0 %
Eosinophils Absolute: 0.1 10*3/uL (ref 0.0–0.5)
Eosinophils Relative: 3 %
HCT: 36.2 % (ref 36.0–46.0)
Hemoglobin: 11.4 g/dL — ABNORMAL LOW (ref 12.0–15.0)
Immature Granulocytes: 0 %
Lymphocytes Relative: 39 %
Lymphs Abs: 2.2 10*3/uL (ref 0.7–4.0)
MCH: 23.1 pg — ABNORMAL LOW (ref 26.0–34.0)
MCHC: 31.5 g/dL (ref 30.0–36.0)
MCV: 73.3 fL — ABNORMAL LOW (ref 80.0–100.0)
Monocytes Absolute: 0.5 10*3/uL (ref 0.1–1.0)
Monocytes Relative: 9 %
Neutro Abs: 2.8 10*3/uL (ref 1.7–7.7)
Neutrophils Relative %: 49 %
Platelet Count: 342 10*3/uL (ref 150–400)
RBC: 4.94 MIL/uL (ref 3.87–5.11)
RDW: 15.9 % — ABNORMAL HIGH (ref 11.5–15.5)
WBC Count: 5.6 10*3/uL (ref 4.0–10.5)
nRBC: 0 % (ref 0.0–0.2)

## 2022-10-16 LAB — IRON AND IRON BINDING CAPACITY (CC-WL,HP ONLY)
Iron: 46 ug/dL (ref 28–170)
Saturation Ratios: 13 % (ref 10.4–31.8)
TIBC: 354 ug/dL (ref 250–450)
UIBC: 308 ug/dL (ref 148–442)

## 2022-10-16 NOTE — Progress Notes (Signed)
[facility] [phone]   Fax:([phone]  OFFICE PROGRESS NOTE  [name], MD [address] [place] [address]  DIAGNOSIS: Iron deficiency anemia and thalassemia minor  PRIOR THERAPY: None  CURRENT THERAPY: Ferrous sulfate 325 mg p.o. daily  INTERVAL HISTORY: Emily Downs 42 y.o. female returns to the clinic today for follow-up visit.  The patient has no complaints today except for mild fatigue.  She has her regular menstruation last week.  She denied having any current chest pain, shortness of breath, cough or hemoptysis.  She has no nausea, vomiting, diarrhea or constipation.  She has no headache or visual changes.  She continues to tolerate her ferrous sulfate fairly well.  The patient is here today for evaluation and repeat blood work.   MEDICAL HISTORY: Past Medical History:  Diagnosis Date   Allergy    Beta thalassemia trait    Beta thalassemia trait    Genital herpes    Gestational diabetes    HPV (human papilloma virus) anogenital infection    Hx of chlamydia infection    Hx of pre-eclampsia in prior pregnancy, currently pregnant    Hypertension    Iron deficiency anemia    Menometrorrhagia     ALLERGIES:  has No Known Allergies.  MEDICATIONS:  Current Outpatient Medications  Medication Sig Dispense Refill   amLODipine (NORVASC) 10 MG tablet TAKE 1 TABLET BY MOUTH EVERY DAY 90 tablet 1   benzonatate (TESSALON PERLES) 100 MG capsule Take 1 capsule (100 mg total) by mouth 3 (three) times daily as needed. 20 capsule 0   cetirizine (ZYRTEC) 10 MG tablet Take 10 mg by mouth daily.     doxycycline (VIBRAMYCIN) 100 MG capsule Take 1 capsule (100 mg total) by mouth 2 (two) times daily. 20 capsule 0   Iron, Ferrous Sulfate, 325 (65 Fe) MG TABS Take 325 mg by mouth daily. 30 tablet 1   metoprolol succinate (TOPROL-XL) 100 MG 24 hr tablet TAKE 1 TABLET BY MOUTH EVERY DAY 90 tablet 0   promethazine-dextromethorphan  (PROMETHAZINE-DM) 6.25-15 MG/5ML syrup Take 5 mLs by mouth 4 (four) times daily as needed for cough. 118 mL 0   triamcinolone ointment (KENALOG) 0.1 % SMARTSIG:sparingly Topical Twice Daily     valACYclovir (VALTREX) 500 MG tablet Take 500 mg by mouth 2 (two) times daily.     No current facility-administered medications for this visit.    SURGICAL HISTORY:  Past Surgical History:  Procedure Laterality Date   CESAREAN SECTION N/A 07/23/2019   Procedure: CESAREAN SECTION;  Surgeon: Marlow Baars, MD;  Location: [facility];  Service: Obstetrics;  Laterality: N/A;   COLPOSCOPY     DILATION AND EVACUATION  2009   DILATION AND EVACUATION N/A 11/08/2015   Procedure: DILATATION AND EVACUATION WITH CHROMASOME STUDIES;  Surgeon: Maxie Better, MD;  Location: [facility];  Service: Gynecology;  Laterality: N/A;    REVIEW OF SYSTEMS:  A comprehensive review of systems was negative except for: Constitutional: positive for fatigue   PHYSICAL EXAMINATION: General appearance: alert, cooperative, fatigued, and no distress Head: Normocephalic, without obvious abnormality, atraumatic Neck: no adenopathy, no JVD, supple, symmetrical, trachea midline, and thyroid not enlarged, symmetric, no tenderness/mass/nodules Lymph nodes: Cervical, supraclavicular, and axillary nodes normal. Resp: clear to auscultation bilaterally Back: symmetric, no curvature. ROM normal. No CVA tenderness. Cardio: regular rate and rhythm, S1, S2 normal, no murmur, click, rub or gallop GI: soft, non-tender; bowel sounds normal; no masses,  no organomegaly Extremities: extremities normal, atraumatic, no cyanosis or edema  ECOG PERFORMANCE STATUS: 1 - Symptomatic but completely ambulatory  Blood pressure 128/84, pulse 63, temperature 98.5 F (36.9 C), temperature source Oral, resp. rate 18, height 5' (1.524 m), weight 168 lb (76.2 kg), SpO2 100%.  LABORATORY DATA: Lab Results  Component Value Date   WBC 5.6 10/16/2022   HGB 11.4  (L) 10/16/2022   HCT 36.2 10/16/2022   MCV 73.3 (L) 10/16/2022   PLT 342 10/16/2022      Chemistry      Component Value Date/Time   NA 138 05/10/2021 1315   NA 140 06/02/2020 1707   NA 138 03/10/2012 1315   K 3.7 05/10/2021 1315   K 4.3 03/10/2012 1315   CL 105 05/10/2021 1315   CL 105 03/10/2012 1315   CO2 28 05/10/2021 1315   CO2 28 03/10/2012 1315   BUN 12 05/10/2021 1315   BUN 13 06/02/2020 1707   BUN 12.0 03/10/2012 1315   CREATININE 0.88 05/10/2021 1315   CREATININE 0.92 07/26/2015 0942   CREATININE 0.9 03/10/2012 1315      Component Value Date/Time   CALCIUM 8.9 05/10/2021 1315   CALCIUM 8.5 03/10/2012 1315   ALKPHOS 73 05/10/2021 1315   ALKPHOS 49 03/10/2012 1315   AST 23 05/10/2021 1315   AST 21 03/10/2012 1315   ALT 16 05/10/2021 1315   ALT 16 03/10/2012 1315   BILITOT 0.4 05/10/2021 1315   BILITOT 0.28 03/10/2012 1315       RADIOGRAPHIC STUDIES: MM DIAG BREAST TOMO BILATERAL  Result Date: 10/02/2022 CLINICAL DATA:  42 year old female presenting for follow-up of a left breast asymmetry. She also states she has been having intermittent left axillary pain for about 6 months. She is not experiencing the pain today. EXAM: DIGITAL DIAGNOSTIC BILATERAL MAMMOGRAM WITH TOMOSYNTHESIS AND CAD; [place] AXILLARY LEFT TECHNIQUE: Bilateral digital diagnostic mammography and breast tomosynthesis was performed. The images were evaluated with computer-aided detection. ; Targeted ultrasound examination of the left axilla was performed. COMPARISON:  Previous exam(s). ACR Breast Density Category c: The breasts are heterogeneously dense, which may obscure small masses. FINDINGS: The asymmetry in the central anterior left breast is mammographically stable. No suspicious calcifications, masses or areas of distortion are seen in the bilateral breasts. Ultrasound of the left axilla demonstrates multiple normal-appearing lymph nodes. No suspicious masses are identified. IMPRESSION: 1.  The  likely benign left breast asymmetry is stable. 2. No abnormal imaging findings in the left axilla to explain the patient's intermittent tenderness. 3. No suspicious calcifications, masses or areas of distortion are seen in the bilateral breasts. RECOMMENDATION: 1.  Diagnostic mammogram is suggested in 1 year. (Code:DM-B-01Y) 2. Clinical follow-up recommended for the intermittent left axillary pain. Any further workup should be based on clinical grounds. I have discussed the findings and recommendations with the patient. If applicable, a reminder letter will be sent to the patient regarding the next appointment. BI-RADS CATEGORY  3: Probably benign. Electronically Signed   By: Frederico Hamman M.D.   On: 10/02/2022 09:50  [place] AXILLA LEFT  Result Date: 10/02/2022 CLINICAL DATA:  42 year old female presenting for follow-up of a left breast asymmetry. She also states she has been having intermittent left axillary pain for about 6 months. She is not experiencing the pain today. EXAM: DIGITAL DIAGNOSTIC BILATERAL MAMMOGRAM WITH TOMOSYNTHESIS AND CAD; [place] AXILLARY LEFT TECHNIQUE: Bilateral digital diagnostic mammography and breast tomosynthesis was performed. The images were evaluated with computer-aided detection. ; Targeted ultrasound  examination of the left axilla was performed. COMPARISON:  Previous exam(s). ACR Breast Density Category c: The breasts are heterogeneously dense, which may obscure small masses. FINDINGS: The asymmetry in the central anterior left breast is mammographically stable. No suspicious calcifications, masses or areas of distortion are seen in the bilateral breasts. Ultrasound of the left axilla demonstrates multiple normal-appearing lymph nodes. No suspicious masses are identified. IMPRESSION: 1.  The likely benign left breast asymmetry is stable. 2. No abnormal imaging findings in the left axilla to explain the patient's intermittent tenderness. 3. No suspicious calcifications, masses or areas  of distortion are seen in the bilateral breasts. RECOMMENDATION: 1.  Diagnostic mammogram is suggested in 1 year. (Code:DM-B-01Y) 2. Clinical follow-up recommended for the intermittent left axillary pain. Any further workup should be based on clinical grounds. I have discussed the findings and recommendations with the patient. If applicable, a reminder letter will be sent to the patient regarding the next appointment. BI-RADS CATEGORY  3: Probably benign. Electronically Signed   By: Frederico Hamman M.D.   On: 10/02/2022 09:50   ASSESSMENT AND PLAN: This is a very pleasant 42 years old African-American female with history of iron deficiency anemia as well as thalassemia minor.  The patient is currently on treatment with ferrous sulfate 325 mg p.o. daily. She has been tolerating her oral iron tablet well. Repeat CBC today showed persistent mild anemia with hemoglobin of 11.4 and hematocrit of 36.2% with MCV of 73.3 consistent with her history of thalassemia minor in addition to recent menstruation. I recommended for the patient to continue on the oral iron tablets for now.  I will see her back for follow-up visit in 6 months for evaluation with repeat blood work. She was advised to call immediately if she has any other concerning symptoms in the interval. The patient voices understanding of current disease status and treatment options and is in agreement with the current care plan.  All questions were answered. The patient knows to call the clinic with any problems, questions or concerns. We can certainly see the patient much sooner if necessary.  The total time spent in the appointment was 20 minutes.  Disclaimer: This note was dictated with voice recognition software. Similar sounding words can inadvertently be transcribed and may not be corrected upon review.

## 2022-10-17 ENCOUNTER — Other Ambulatory Visit: Payer: 59

## 2022-10-17 ENCOUNTER — Ambulatory Visit: Payer: 59 | Admitting: Internal Medicine

## 2022-10-17 LAB — FERRITIN: Ferritin: 18 ng/mL (ref 11–307)

## 2022-10-18 ENCOUNTER — Other Ambulatory Visit: Payer: Self-pay | Admitting: Emergency Medicine

## 2023-01-20 IMAGING — CT CT HEAD W/O CM
4 series · 15 of 47 positions shown, 17 images · non-contrast
Comparison: None.

CLINICAL DATA: Headache with history of hypertension on medication.

EXAM:
CT HEAD WITHOUT CONTRAST
TECHNIQUE: Contiguous axial images were obtained from the base of the skull
through the vertex without intravenous contrast.

[Series 2: head 5.00 hr40 s3 axial ibhc · axial · 0.43mm/px · z∈[-606,-501]mm · 6 of 31 slices shown, 8 images]
[im 5/31  brain]
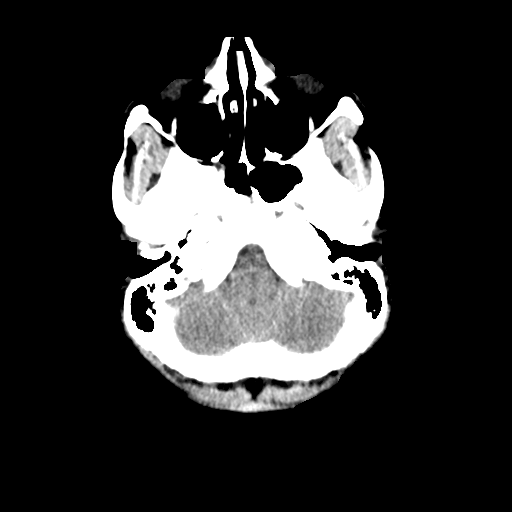
[im 5/31  bone]
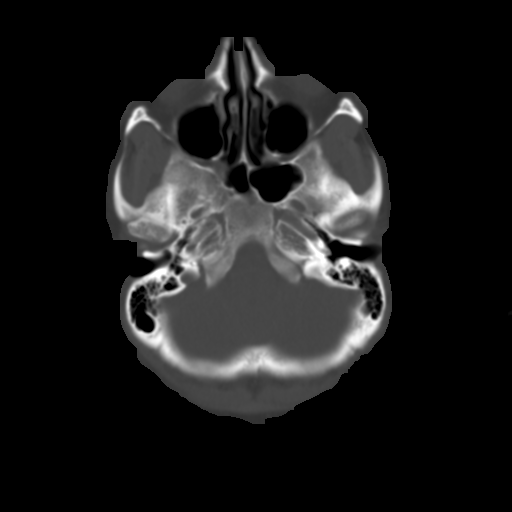
[im 9/31  brain]
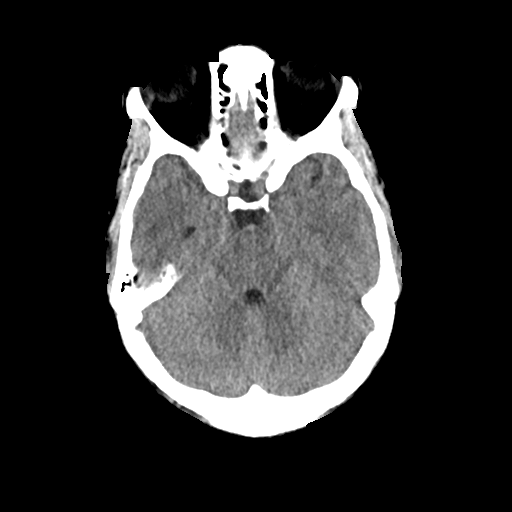
[im 13/31  brain]
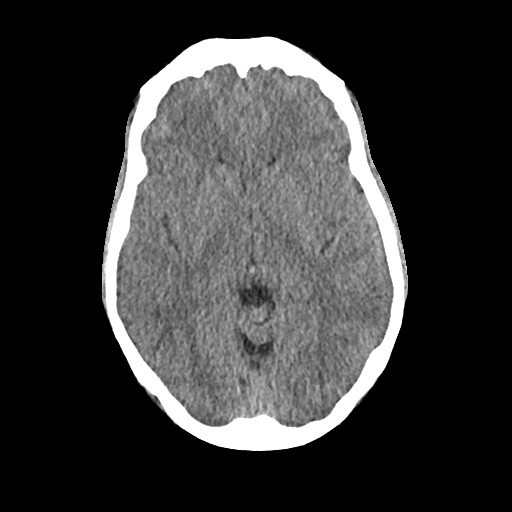
[im 18/31  brain]
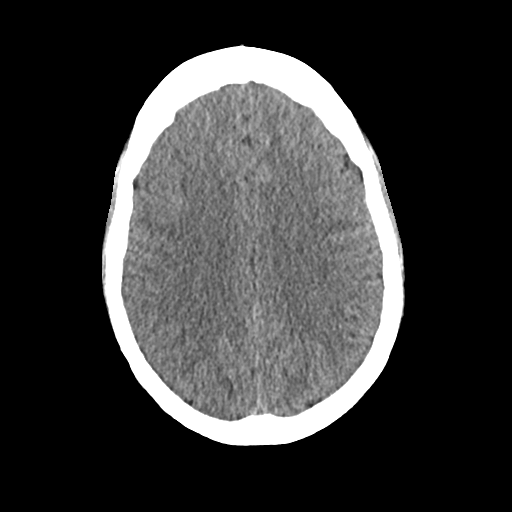
[im 22/31  brain]
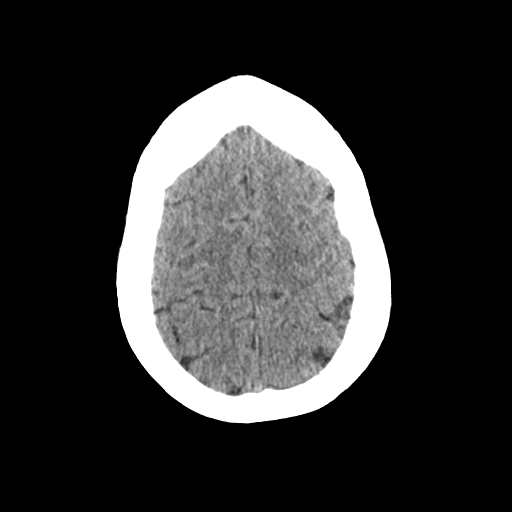
[im 22/31  bone]
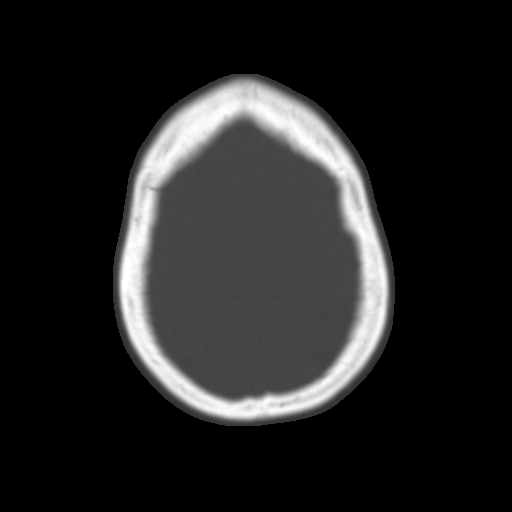
[im 26/31  brain]
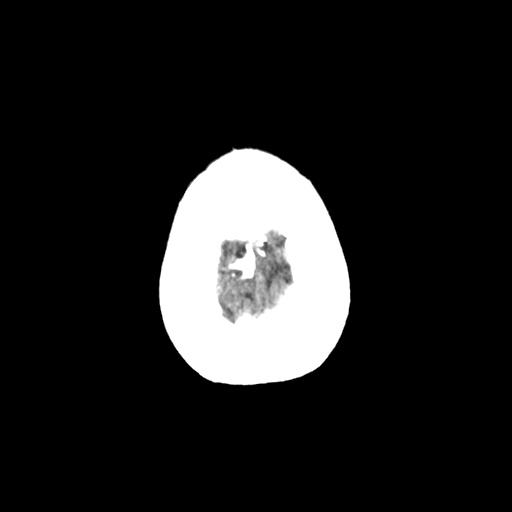

[Series 3: head 2.00 hr60 s3 axial bone · axial · 0.43mm/px · z∈[-613,-575]mm · 3 of 79 slices shown]
[im 8/79  bone]
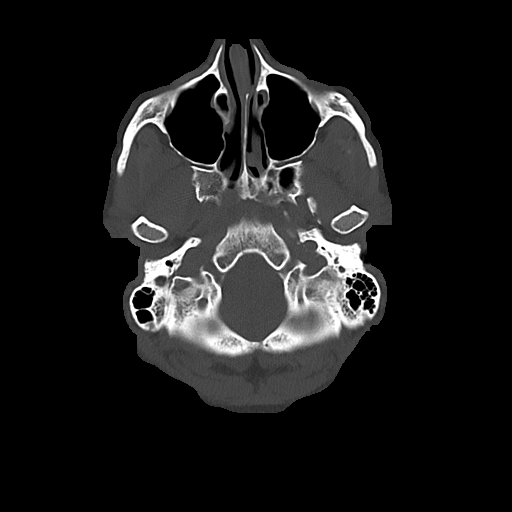
[im 15/79  bone]
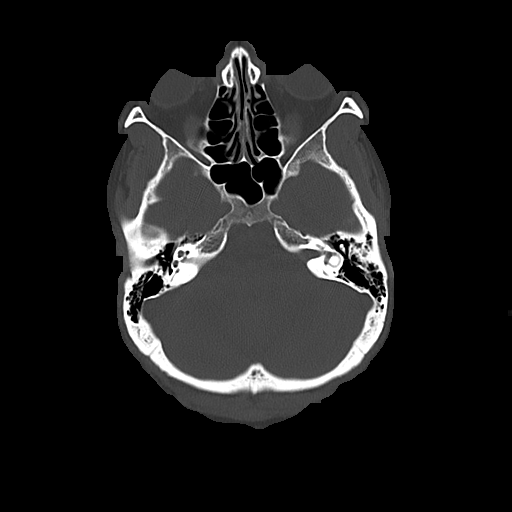
[im 27/79  bone]
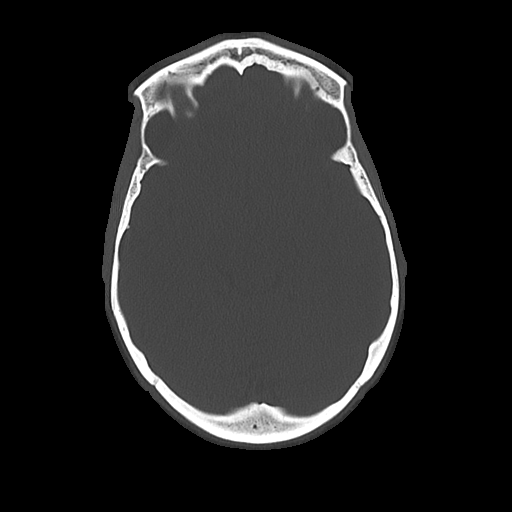

[Series 4: head 3.00 hr40 s3 sag · sagittal · 0.31mm/px · 3 of 73 slices shown]
[im 25/73  brain]
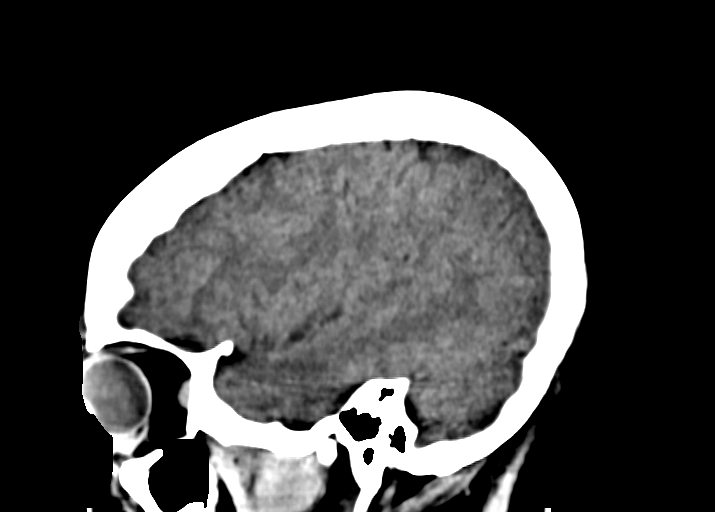
[im 37/73  brain]
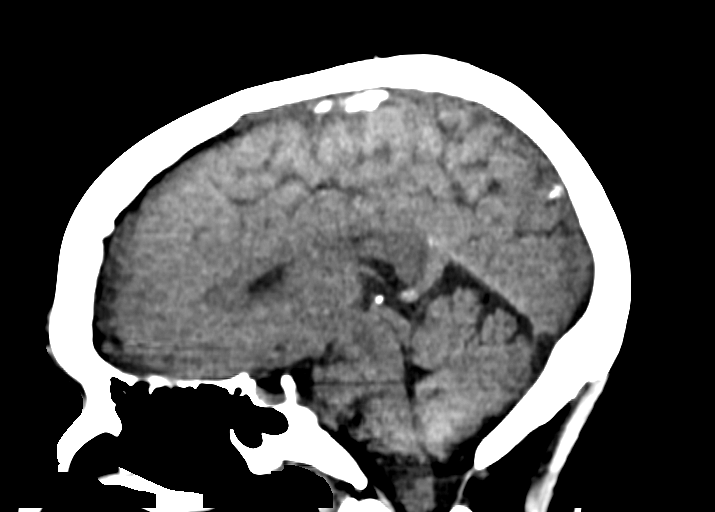
[im 49/73  brain]
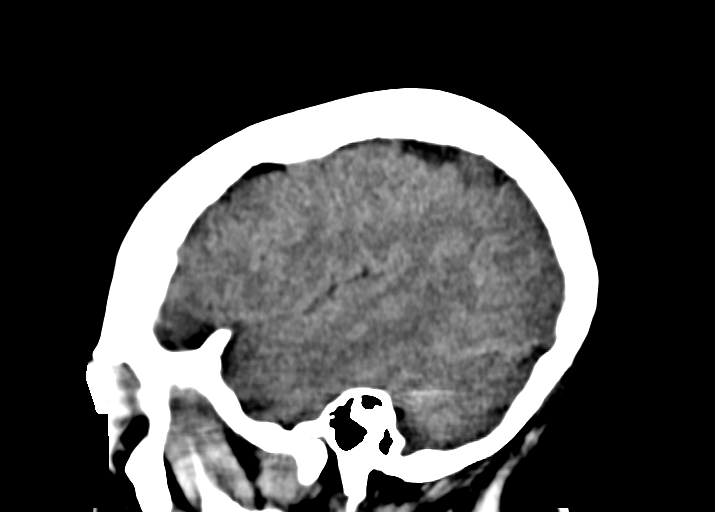

[Series 6: head 3.00 hr40 s3 cor · coronal · 0.31mm/px · 3 of 73 slices shown]
[im 25/73  brain]
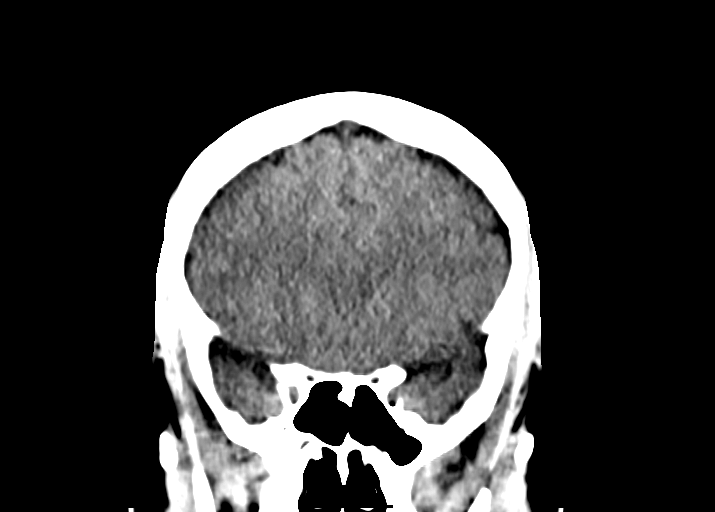
[im 33/73  brain]
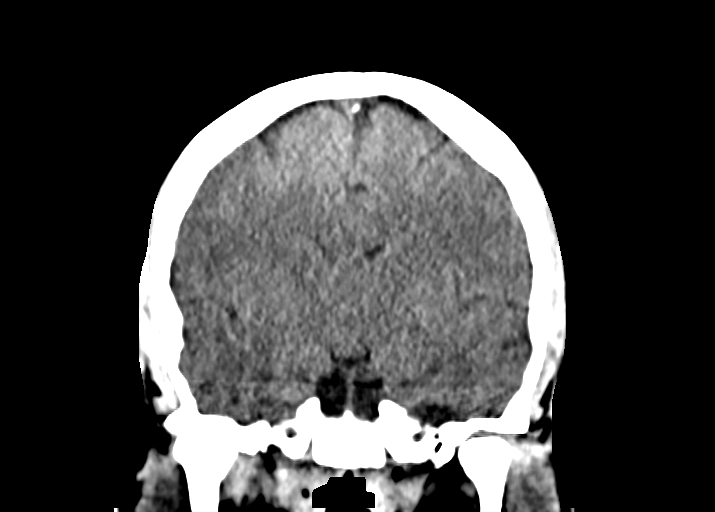
[im 41/73  brain]
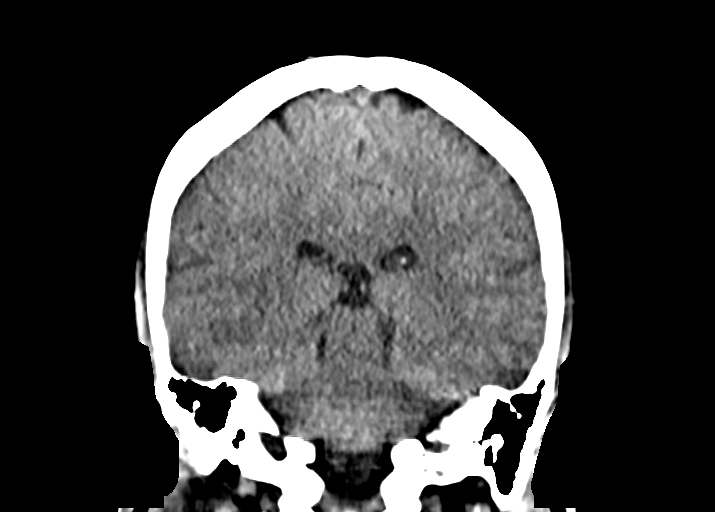

[15 of 47 positions shown; findings below may reference images not displayed]

FINDINGS: Brain: No evidence of acute infarction, hemorrhage, hydrocephalus,
extra-axial collection or mass lesion/mass effect.

Vascular: No hyperdense vessel or unexpected calcification.

Skull: Normal. Negative for fracture or focal lesion.

Sinuses/Orbits: The orbits and orbital contents are unremarkable.
There is mild membrane thickening in the right maxillary sinus.
Other visualized sinuses and mastoid air cells are clear.

Other: None.
IMPRESSION: No acute intracranial CT findings. Right maxillary sinus mucosal
disease.

## 2023-02-07 ENCOUNTER — Ambulatory Visit (INDEPENDENT_AMBULATORY_CARE_PROVIDER_SITE_OTHER): Payer: 59 | Admitting: Emergency Medicine

## 2023-02-07 ENCOUNTER — Encounter: Payer: Self-pay | Admitting: Emergency Medicine

## 2023-02-07 VITALS — BP 126/88 | HR 97 | Temp 98.6°F | Ht 60.0 in | Wt 166.1 lb

## 2023-02-07 DIAGNOSIS — R6889 Other general symptoms and signs: Secondary | ICD-10-CM | POA: Insufficient documentation

## 2023-02-07 DIAGNOSIS — I10 Essential (primary) hypertension: Secondary | ICD-10-CM

## 2023-02-07 DIAGNOSIS — J22 Unspecified acute lower respiratory infection: Secondary | ICD-10-CM | POA: Diagnosis not present

## 2023-02-07 LAB — CBC WITH DIFFERENTIAL/PLATELET
Basophils Absolute: 0 10*3/uL (ref 0.0–0.1)
Basophils Relative: 0.7 % (ref 0.0–3.0)
Eosinophils Absolute: 0.7 10*3/uL (ref 0.0–0.7)
Eosinophils Relative: 11.2 % — ABNORMAL HIGH (ref 0.0–5.0)
HCT: 40.9 % (ref 36.0–46.0)
Hemoglobin: 13 g/dL (ref 12.0–15.0)
Lymphocytes Relative: 30.3 % (ref 12.0–46.0)
Lymphs Abs: 1.9 10*3/uL (ref 0.7–4.0)
MCHC: 31.8 g/dL (ref 30.0–36.0)
MCV: 73.6 fl — ABNORMAL LOW (ref 78.0–100.0)
Monocytes Absolute: 0.5 10*3/uL (ref 0.1–1.0)
Monocytes Relative: 7.3 % (ref 3.0–12.0)
Neutro Abs: 3.2 10*3/uL (ref 1.4–7.7)
Neutrophils Relative %: 50.5 % (ref 43.0–77.0)
Platelets: 373 10*3/uL (ref 150.0–400.0)
RBC: 5.55 Mil/uL — ABNORMAL HIGH (ref 3.87–5.11)
RDW: 16 % — ABNORMAL HIGH (ref 11.5–15.5)
WBC: 6.4 10*3/uL (ref 4.0–10.5)

## 2023-02-07 LAB — LIPID PANEL
Cholesterol: 190 mg/dL (ref 0–200)
HDL: 62.6 mg/dL
LDL Cholesterol: 113 mg/dL — ABNORMAL HIGH (ref 0–99)
NonHDL: 127.22
Total CHOL/HDL Ratio: 3
Triglycerides: 70 mg/dL (ref 0.0–149.0)
VLDL: 14 mg/dL (ref 0.0–40.0)

## 2023-02-07 LAB — COMPREHENSIVE METABOLIC PANEL WITH GFR
ALT: 19 U/L (ref 0–35)
AST: 26 U/L (ref 0–37)
Albumin: 4.2 g/dL (ref 3.5–5.2)
Alkaline Phosphatase: 84 U/L (ref 39–117)
BUN: 10 mg/dL (ref 6–23)
CO2: 31 meq/L (ref 19–32)
Calcium: 9.1 mg/dL (ref 8.4–10.5)
Chloride: 103 meq/L (ref 96–112)
Creatinine, Ser: 0.91 mg/dL (ref 0.40–1.20)
GFR: 77.02 mL/min
Glucose, Bld: 94 mg/dL (ref 70–99)
Potassium: 4.2 meq/L (ref 3.5–5.1)
Sodium: 139 meq/L (ref 135–145)
Total Bilirubin: 0.5 mg/dL (ref 0.2–1.2)
Total Protein: 7.5 g/dL (ref 6.0–8.3)

## 2023-02-07 LAB — HEMOGLOBIN A1C: Hgb A1c MFr Bld: 6 % (ref 4.6–6.5)

## 2023-02-07 MED ORDER — AMOXICILLIN-POT CLAVULANATE 875-125 MG PO TABS: 1.0000 | ORAL_TABLET | Freq: Two times a day (BID) | ORAL | 0 refills | Status: AC

## 2023-02-07 NOTE — Assessment & Plan Note (Signed)
Upper viral respiratory infection now with secondary bacterial infection Recommend to start Augmentin 875 mg twice a day for 7 days Advised to rest and stay well-hydrated Symptom management discussed Recommend over-the-counter Mucinex DM and cough drops

## 2023-02-07 NOTE — Assessment & Plan Note (Signed)
BP Readings from Last 3 Encounters:  02/07/23 126/88  10/16/22 128/84  09/08/22 (!) 139/90  Well-controlled hypertension Continue amlodipine 10 mg daily and metoprolol succinate 100 mg daily Cardiovascular risks associated with hypertension discussed Dietary approaches to stop hypertension discussed Benefits of exercise discussed Blood work today Follow-up in 6 months

## 2023-02-07 NOTE — Patient Instructions (Signed)
Hypertension, Adult High blood pressure (hypertension) is when the force of blood pumping through the arteries is too strong. The arteries are the blood vessels that carry blood from the heart throughout the body. Hypertension forces the heart to work harder to pump blood and may cause arteries to become narrow or stiff. Untreated or uncontrolled hypertension can lead to a heart attack, heart failure, a stroke, kidney disease, and other problems. A blood pressure reading consists of a higher number over a lower number. Ideally, your blood pressure should be below 120/80. The first ("top") number is called the systolic pressure. It is a measure of the pressure in your arteries as your heart beats. The second ("bottom") number is called the diastolic pressure. It is a measure of the pressure in your arteries as the heart relaxes. What are the causes? The exact cause of this condition is not known. There are some conditions that result in high blood pressure. What increases the risk? Certain factors may make you more likely to develop high blood pressure. Some of these risk factors are under your control, including: Smoking. Not getting enough exercise or physical activity. Being overweight. Having too much fat, sugar, calories, or salt (sodium) in your diet. Drinking too much alcohol. Other risk factors include: Having a personal history of heart disease, diabetes, high cholesterol, or kidney disease. Stress. Having a family history of high blood pressure and high cholesterol. Having obstructive sleep apnea. Age. The risk increases with age. What are the signs or symptoms? High blood pressure may not cause symptoms. Very high blood pressure (hypertensive crisis) may cause: Headache. Fast or irregular heartbeats (palpitations). Shortness of breath. Nosebleed. Nausea and vomiting. Vision changes. Severe chest pain, dizziness, and seizures. How is this diagnosed? This condition is diagnosed by  measuring your blood pressure while you are seated, with your arm resting on a flat surface, your legs uncrossed, and your feet flat on the floor. The cuff of the blood pressure monitor will be placed directly against the skin of your upper arm at the level of your heart. Blood pressure should be measured at least twice using the same arm. Certain conditions can cause a difference in blood pressure between your right and left arms. If you have a high blood pressure reading during one visit or you have normal blood pressure with other risk factors, you may be asked to: Return on a different day to have your blood pressure checked again. Monitor your blood pressure at home for 1 week or longer. If you are diagnosed with hypertension, you may have other blood or imaging tests to help your health care provider understand your overall risk for other conditions. How is this treated? This condition is treated by making healthy lifestyle changes, such as eating healthy foods, exercising more, and reducing your alcohol intake. You may be referred for counseling on a healthy diet and physical activity. Your health care provider may prescribe medicine if lifestyle changes are not enough to get your blood pressure under control and if: Your systolic blood pressure is above 130. Your diastolic blood pressure is above 80. Your personal target blood pressure may vary depending on your medical conditions, your age, and other factors. Follow these instructions at home: Eating and drinking  Eat a diet that is high in fiber and potassium, and low in sodium, added sugar, and fat. An example of this eating plan is called the DASH diet. DASH stands for Dietary Approaches to Stop Hypertension. To eat this way: Eat   plenty of fresh fruits and vegetables. Try to fill one half of your plate at each meal with fruits and vegetables. Eat whole grains, such as whole-wheat pasta, brown rice, or whole-grain bread. Fill about one  fourth of your plate with whole grains. Eat or drink low-fat dairy products, such as skim milk or low-fat yogurt. Avoid fatty cuts of meat, processed or cured meats, and poultry with skin. Fill about one fourth of your plate with lean proteins, such as fish, chicken without skin, beans, eggs, or tofu. Avoid pre-made and processed foods. These tend to be higher in sodium, added sugar, and fat. Reduce your daily sodium intake. Many people with hypertension should eat less than 1,500 mg of sodium a day. Do not drink alcohol if: Your health care provider tells you not to drink. You are pregnant, may be pregnant, or are planning to become pregnant. If you drink alcohol: Limit how much you have to: 0-1 drink a day for women. 0-2 drinks a day for men. Know how much alcohol is in your drink. In the [place] one drink equals one 12 oz bottle of beer (355 mL), one 5 oz glass of wine (148 mL), or one 1 oz glass of hard liquor (44 mL). Lifestyle  Work with your health care provider to maintain a healthy body weight or to lose weight. Ask what an ideal weight is for you. Get at least 30 minutes of exercise that causes your heart to beat faster (aerobic exercise) most days of the week. Activities may include walking, swimming, or biking. Include exercise to strengthen your muscles (resistance exercise), such as Pilates or lifting weights, as part of your weekly exercise routine. Try to do these types of exercises for 30 minutes at least 3 days a week. Do not use any products that contain nicotine or tobacco. These products include cigarettes, chewing tobacco, and vaping devices, such as e-cigarettes. If you need help quitting, ask your health care provider. Monitor your blood pressure at home as told by your health care provider. Keep all follow-up visits. This is important. Medicines Take over-the-counter and prescription medicines only as told by your health care provider. Follow directions carefully. Blood  pressure medicines must be taken as prescribed. Do not skip doses of blood pressure medicine. Doing this puts you at risk for problems and can make the medicine less effective. Ask your health care provider about side effects or reactions to medicines that you should watch for. Contact a health care provider if you: Think you are having a reaction to a medicine you are taking. Have headaches that keep coming back (recurring). Feel dizzy. Have swelling in your ankles. Have trouble with your vision. Get help right away if you: Develop a severe headache or confusion. Have unusual weakness or numbness. Feel faint. Have severe pain in your chest or abdomen. Vomit repeatedly. Have trouble breathing. These symptoms may be an emergency. Get help right away. Call 911. Do not wait to see if the symptoms will go away. Do not drive yourself to the hospital. Summary Hypertension is when the force of blood pumping through your arteries is too strong. If this condition is not controlled, it may put you at risk for serious complications. Your personal target blood pressure may vary depending on your medical conditions, your age, and other factors. For most people, a normal blood pressure is less than 120/80. Hypertension is treated with lifestyle changes, medicines, or a combination of both. Lifestyle changes include losing weight, eating a healthy,   low-sodium diet, exercising more, and limiting alcohol. This information is not intended to replace advice given to you by your health care provider. Make sure you discuss any questions you have with your health care provider. Document Revised: 01/17/2021 Document Reviewed: 01/17/2021 [employer] Patient Education  2024 [employer]  

## 2023-02-07 NOTE — Assessment & Plan Note (Signed)
Symptom management discussed Advised to rest and stay well-hydrated

## 2023-02-07 NOTE — Progress Notes (Signed)
Emily Downs 42 y.o.   Chief Complaint  Patient presents with   Medical Management of Chronic Issues    Cough and congestion, 4-5 weeks. No fever     HISTORY OF PRESENT ILLNESS: This is a 42 y.o. female here for 39-month follow-up of hypertension Also complaining of flulike symptoms with cough and congestion for the past 4 to 5 weeks.  Progressively getting worse. No other complaints or medical concerns today. BP Readings from Last 3 Encounters:  10/16/22 128/84  09/08/22 (!) 139/90  04/17/22 123/87     HPI   Prior to Admission medications   Medication Sig Start Date End Date Taking? Authorizing Provider  amLODipine (NORVASC) 10 MG tablet TAKE 1 TABLET BY MOUTH EVERY DAY 07/19/22  Yes Saulo Anthis, Eilleen Kempf, MD  cetirizine (ZYRTEC) 10 MG tablet Take 10 mg by mouth daily.   Yes [provider]  Iron, Ferrous Sulfate, 325 (65 Fe) MG TABS Take 325 mg by mouth daily. 04/21/21  Yes Elenore Paddy, NP  metoprolol succinate (TOPROL-XL) 100 MG 24 hr tablet TAKE 1 TABLET BY MOUTH EVERY DAY 10/18/22  Yes Jocilynn Grade, Eilleen Kempf, MD  promethazine-dextromethorphan (PROMETHAZINE-DM) 6.25-15 MG/5ML syrup Take 5 mLs by mouth 4 (four) times daily as needed for cough. 01/11/22  Yes Jeani Sow, MD  triamcinolone ointment (KENALOG) 0.1 % SMARTSIG:sparingly Topical Twice Daily 12/24/21  Yes [provider]  valACYclovir (VALTREX) 500 MG tablet Take 500 mg by mouth 2 (two) times daily. 12/24/21  Yes [provider]  benzonatate (TESSALON PERLES) 100 MG capsule Take 1 capsule (100 mg total) by mouth 3 (three) times daily as needed. Patient not taking: Reported on 02/07/2023 01/11/22   Jeani Sow, MD    No Known Allergies  Patient Active Problem List   Diagnosis Date Noted   Acute recurrent maxillary sinusitis 05/03/2021   Essential hypertension, benign 12/11/2013   Menometrorrhagia    Iron deficiency anemia     Past Medical History:  Diagnosis Date    Allergy    Beta thalassemia trait    Beta thalassemia trait    Genital herpes    Gestational diabetes    HPV (human papilloma virus) anogenital infection    Hx of chlamydia infection    Hx of pre-eclampsia in prior pregnancy, currently pregnant    Hypertension    Iron deficiency anemia    Menometrorrhagia     Past Surgical History:  Procedure Laterality Date   CESAREAN SECTION N/A 07/23/2019   Procedure: CESAREAN SECTION;  Surgeon: Marlow Baars, MD;  Location: [facility];  Service: Obstetrics;  Laterality: N/A;   COLPOSCOPY     DILATION AND EVACUATION  2009   DILATION AND EVACUATION N/A 11/08/2015   Procedure: DILATATION AND EVACUATION WITH CHROMASOME STUDIES;  Surgeon: Maxie Better, MD;  Location: [facility];  Service: Gynecology;  Laterality: N/A;    Social History   Socioeconomic History   Marital status: Married    Spouse name: Delcia Danks   Number of children: 1   Years of education: MBA   Highest education level: Master's degree (e.g., MA, MS, MEng, MEd, MSW, MBA)  Occupational History   Occupation: Social worker   Occupation: residence Naval architect    Comment: [employer] (04/23/2016)  Tobacco Use   Smoking status: Never   Smokeless tobacco: Never  Vaping Use   Vaping status: Never Used  Substance and Sexual Activity   Alcohol use: No   Drug use: No   Sexual activity: Not  Currently    Birth control/protection: None  Other Topics Concern   Not on file  Social History Narrative   Marital status: married x 7 years      Children: 67 yo daughter.      Lives: with husband, daughter.      Employment:  Interior and spatial designer, Residence Naval architect      Tobacco; none      Alcohol: rare to none in 2018      Drugs:  None      Exercise: none in 2018      Seatbelt: 100%   Social Determinants of Health   Financial Resource Strain: Low Risk  (02/05/2023)   Overall Financial Resource Strain (CARDIA)    Difficulty of Paying Living Expenses: Not very hard  Food Insecurity: No  Food Insecurity (02/05/2023)   Hunger Vital Sign    Worried About Running Out of Food in the Last Year: Never true    Ran Out of Food in the Last Year: Never true  Transportation Needs: No Transportation Needs (02/05/2023)   PRAPARE - Administrator, Civil Service (Medical): No    Lack of Transportation (Non-Medical): No  Physical Activity: Sufficiently Active (02/05/2023)   Exercise Vital Sign    Days of Exercise per Week: 3 days    Minutes of Exercise per Session: 90 min  Stress: Stress Concern Present (02/05/2023)   [employer] of Occupational Health - Occupational Stress Questionnaire    Feeling of Stress : To some extent  Social Connections: Socially Integrated (02/05/2023)   Social Connection and Isolation Panel [NHANES]    Frequency of Communication with Friends and Family: More than three times a week    Frequency of Social Gatherings with Friends and Family: Patient declined    Attends Religious Services: More than 4 times per year    Active Member of [employer] or Organizations: Yes    Attends Banker Meetings: 1 to 4 times per year    Marital Status: Married  Catering manager Violence: Not on file    Family History  Problem Relation Age of Onset   Hypertension Mother    Diabetes Mother    Aneurysm Mother 31       brain aneurysm   Sarcoidosis Mother    Hyperlipidemia Father    Crohn's disease Sister    Hypertension Sister    Diabetes Maternal Grandmother    Stroke Maternal Grandmother    Breast cancer Paternal Grandmother    Cancer Paternal Grandmother      Review of Systems  Constitutional: Negative.  Negative for chills and fever.  HENT:  Positive for congestion.   Respiratory:  Positive for cough.   Cardiovascular: Negative.  Negative for chest pain and palpitations.  Gastrointestinal:  Negative for abdominal pain, diarrhea, nausea and vomiting.  Genitourinary: Negative.  Negative for dysuria and hematuria.  Skin: Negative.   Negative for rash.  Neurological: Negative.  Negative for dizziness and headaches.  All other systems reviewed and are negative.   Today's Vitals   02/07/23 0854  BP: 126/88  Pulse: 97  Temp: 98.6 F (37 C)  TempSrc: Oral  SpO2: 97%  Weight: 166 lb 2 oz (75.4 kg)  Height: 5' (1.524 m)   Body mass index is 32.44 kg/m.   Physical Exam Vitals reviewed.  Constitutional:      Appearance: Normal appearance.  HENT:     Head: Normocephalic.     Right Ear: Tympanic membrane, ear canal and external ear  normal.     Left Ear: Tympanic membrane, ear canal and external ear normal.     Mouth/Throat:     Mouth: Mucous membranes are moist.     Pharynx: Oropharynx is clear.  Eyes:     Extraocular Movements: Extraocular movements intact.     Conjunctiva/sclera: Conjunctivae normal.     Pupils: Pupils are equal, round, and reactive to light.  Cardiovascular:     Rate and Rhythm: Normal rate and regular rhythm.     Pulses: Normal pulses.     Heart sounds: Normal heart sounds.  Pulmonary:     Effort: Pulmonary effort is normal.     Breath sounds: Normal breath sounds.  Musculoskeletal:     Cervical back: No tenderness.  Lymphadenopathy:     Cervical: No cervical adenopathy.  Skin:    General: Skin is warm and dry.     Capillary Refill: Capillary refill takes less than 2 seconds.  Neurological:     General: No focal deficit present.     Mental Status: She is alert and oriented to person, place, and time.  Psychiatric:        Mood and Affect: Mood normal.        Behavior: Behavior normal.      ASSESSMENT & PLAN: A total of 42 minutes was spent with the patient and counseling/coordination of care regarding preparing for this visit, review of most recent office visit notes, diagnosis of hypertension and cardiovascular risks associated with this condition, review of all medications, need for blood work today, education on nutrition, prognosis, diagnosis of lower respiratory infection  and need for antibiotics, documentation and need for follow-up.  Problem List Items Addressed This Visit       Cardiovascular and Mediastinum   Essential hypertension, benign - Primary    BP Readings from Last 3 Encounters:  02/07/23 126/88  10/16/22 128/84  09/08/22 (!) 139/90  Well-controlled hypertension Continue amlodipine 10 mg daily and metoprolol succinate 100 mg daily Cardiovascular risks associated with hypertension discussed Dietary approaches to stop hypertension discussed Benefits of exercise discussed Blood work today Follow-up in 6 months       Relevant Orders   Comprehensive metabolic panel   Hemoglobin A1c   Lipid panel     Respiratory   Lower respiratory infection    Upper viral respiratory infection now with secondary bacterial infection Recommend to start Augmentin 875 mg twice a day for 7 days Advised to rest and stay well-hydrated Symptom management discussed Recommend over-the-counter Mucinex DM and cough drops      Relevant Medications   amoxicillin-clavulanate (AUGMENTIN) 875-125 MG tablet   Other Relevant Orders   CBC with Differential/Platelet     Other   Flu-like symptoms    Symptom management discussed Advised to rest and stay well-hydrated      Patient Instructions  Hypertension, Adult High blood pressure (hypertension) is when the force of blood pumping through the arteries is too strong. The arteries are the blood vessels that carry blood from the heart throughout the body. Hypertension forces the heart to work harder to pump blood and may cause arteries to become narrow or stiff. Untreated or uncontrolled hypertension can lead to a heart attack, heart failure, a stroke, kidney disease, and other problems. A blood pressure reading consists of a higher number over a lower number. Ideally, your blood pressure should be below 120/80. The first ("top") number is called the systolic pressure. It is a measure of the pressure in your arteries  as your heart beats. The second ("bottom") number is called the diastolic pressure. It is a measure of the pressure in your arteries as the heart relaxes. What are the causes? The exact cause of this condition is not known. There are some conditions that result in high blood pressure. What increases the risk? Certain factors may make you more likely to develop high blood pressure. Some of these risk factors are under your control, including: Smoking. Not getting enough exercise or physical activity. Being overweight. Having too much fat, sugar, calories, or salt (sodium) in your diet. Drinking too much alcohol. Other risk factors include: Having a personal history of heart disease, diabetes, high cholesterol, or kidney disease. Stress. Having a family history of high blood pressure and high cholesterol. Having obstructive sleep apnea. Age. The risk increases with age. What are the signs or symptoms? High blood pressure may not cause symptoms. Very high blood pressure (hypertensive crisis) may cause: Headache. Fast or irregular heartbeats (palpitations). Shortness of breath. Nosebleed. Nausea and vomiting. Vision changes. Severe chest pain, dizziness, and seizures. How is this diagnosed? This condition is diagnosed by measuring your blood pressure while you are seated, with your arm resting on a flat surface, your legs uncrossed, and your feet flat on the floor. The cuff of the blood pressure monitor will be placed directly against the skin of your upper arm at the level of your heart. Blood pressure should be measured at least twice using the same arm. Certain conditions can cause a difference in blood pressure between your right and left arms. If you have a high blood pressure reading during one visit or you have normal blood pressure with other risk factors, you may be asked to: Return on a different day to have your blood pressure checked again. Monitor your blood pressure at home for  1 week or longer. If you are diagnosed with hypertension, you may have other blood or imaging tests to help your health care provider understand your overall risk for other conditions. How is this treated? This condition is treated by making healthy lifestyle changes, such as eating healthy foods, exercising more, and reducing your alcohol intake. You may be referred for counseling on a healthy diet and physical activity. Your health care provider may prescribe medicine if lifestyle changes are not enough to get your blood pressure under control and if: Your systolic blood pressure is above 130. Your diastolic blood pressure is above 80. Your personal target blood pressure may vary depending on your medical conditions, your age, and other factors. Follow these instructions at home: Eating and drinking  Eat a diet that is high in fiber and potassium, and low in sodium, added sugar, and fat. An example of this eating plan is called the DASH diet. DASH stands for Dietary Approaches to Stop Hypertension. To eat this way: Eat plenty of fresh fruits and vegetables. Try to fill one half of your plate at each meal with fruits and vegetables. Eat whole grains, such as whole-wheat pasta, brown rice, or whole-grain bread. Fill about one fourth of your plate with whole grains. Eat or drink low-fat dairy products, such as skim milk or low-fat yogurt. Avoid fatty cuts of meat, processed or cured meats, and poultry with skin. Fill about one fourth of your plate with lean proteins, such as fish, chicken without skin, beans, eggs, or tofu. Avoid pre-made and processed foods. These tend to be higher in sodium, added sugar, and fat. Reduce your daily sodium intake. Many people  with hypertension should eat less than 1,500 mg of sodium a day. Do not drink alcohol if: Your health care provider tells you not to drink. You are pregnant, may be pregnant, or are planning to become pregnant. If you drink alcohol: Limit  how much you have to: 0-1 drink a day for women. 0-2 drinks a day for men. Know how much alcohol is in your drink. In the [place] one drink equals one 12 oz bottle of beer (355 mL), one 5 oz glass of wine (148 mL), or one 1 oz glass of hard liquor (44 mL). Lifestyle  Work with your health care provider to maintain a healthy body weight or to lose weight. Ask what an ideal weight is for you. Get at least 30 minutes of exercise that causes your heart to beat faster (aerobic exercise) most days of the week. Activities may include walking, swimming, or biking. Include exercise to strengthen your muscles (resistance exercise), such as Pilates or lifting weights, as part of your weekly exercise routine. Try to do these types of exercises for 30 minutes at least 3 days a week. Do not use any products that contain nicotine or tobacco. These products include cigarettes, chewing tobacco, and vaping devices, such as e-cigarettes. If you need help quitting, ask your health care provider. Monitor your blood pressure at home as told by your health care provider. Keep all follow-up visits. This is important. Medicines Take over-the-counter and prescription medicines only as told by your health care provider. Follow directions carefully. Blood pressure medicines must be taken as prescribed. Do not skip doses of blood pressure medicine. Doing this puts you at risk for problems and can make the medicine less effective. Ask your health care provider about side effects or reactions to medicines that you should watch for. Contact a health care provider if you: Think you are having a reaction to a medicine you are taking. Have headaches that keep coming back (recurring). Feel dizzy. Have swelling in your ankles. Have trouble with your vision. Get help right away if you: Develop a severe headache or confusion. Have unusual weakness or numbness. Feel faint. Have severe pain in your chest or abdomen. Vomit  repeatedly. Have trouble breathing. These symptoms may be an emergency. Get help right away. Call 911. Do not wait to see if the symptoms will go away. Do not drive yourself to the hospital. Summary Hypertension is when the force of blood pumping through your arteries is too strong. If this condition is not controlled, it may put you at risk for serious complications. Your personal target blood pressure may vary depending on your medical conditions, your age, and other factors. For most people, a normal blood pressure is less than 120/80. Hypertension is treated with lifestyle changes, medicines, or a combination of both. Lifestyle changes include losing weight, eating a healthy, low-sodium diet, exercising more, and limiting alcohol. This information is not intended to replace advice given to you by your health care provider. Make sure you discuss any questions you have with your health care provider. Document Revised: 01/17/2021 Document Reviewed: 01/17/2021 Elsevier Patient Education  2024 Elsevier Inc.     Edwina Barth, MD [facility]

## 2023-04-16 ENCOUNTER — Inpatient Hospital Stay (HOSPITAL_BASED_OUTPATIENT_CLINIC_OR_DEPARTMENT_OTHER): Payer: 59 | Admitting: Internal Medicine

## 2023-04-16 ENCOUNTER — Inpatient Hospital Stay: Payer: 59 | Attending: Internal Medicine

## 2023-04-16 VITALS — BP 134/87 | HR 65 | Temp 98.0°F | Resp 15 | Ht 60.0 in | Wt 162.0 lb

## 2023-04-16 DIAGNOSIS — D508 Other iron deficiency anemias: Secondary | ICD-10-CM

## 2023-04-16 DIAGNOSIS — D509 Iron deficiency anemia, unspecified: Secondary | ICD-10-CM | POA: Diagnosis present

## 2023-04-16 DIAGNOSIS — D563 Thalassemia minor: Secondary | ICD-10-CM | POA: Diagnosis not present

## 2023-04-16 DIAGNOSIS — Z79899 Other long term (current) drug therapy: Secondary | ICD-10-CM | POA: Diagnosis not present

## 2023-04-16 LAB — CBC WITH DIFFERENTIAL (CANCER CENTER ONLY)
Abs Immature Granulocytes: 0.01 10*3/uL (ref 0.00–0.07)
Basophils Absolute: 0 10*3/uL (ref 0.0–0.1)
Basophils Relative: 1 %
Eosinophils Absolute: 0.2 10*3/uL (ref 0.0–0.5)
Eosinophils Relative: 3 %
HCT: 40.1 % (ref 36.0–46.0)
Hemoglobin: 12.6 g/dL (ref 12.0–15.0)
Immature Granulocytes: 0 %
Lymphocytes Relative: 40 %
Lymphs Abs: 2.5 10*3/uL (ref 0.7–4.0)
MCH: 23 pg — ABNORMAL LOW (ref 26.0–34.0)
MCHC: 31.4 g/dL (ref 30.0–36.0)
MCV: 73.2 fL — ABNORMAL LOW (ref 80.0–100.0)
Monocytes Absolute: 0.3 10*3/uL (ref 0.1–1.0)
Monocytes Relative: 5 %
Neutro Abs: 3.1 10*3/uL (ref 1.7–7.7)
Neutrophils Relative %: 51 %
Platelet Count: 369 10*3/uL (ref 150–400)
RBC: 5.48 MIL/uL — ABNORMAL HIGH (ref 3.87–5.11)
RDW: 15.9 % — ABNORMAL HIGH (ref 11.5–15.5)
WBC Count: 6.1 10*3/uL (ref 4.0–10.5)
nRBC: 0 % (ref 0.0–0.2)

## 2023-04-16 LAB — IRON AND IRON BINDING CAPACITY (CC-WL,HP ONLY)
Iron: 95 ug/dL (ref 28–170)
Saturation Ratios: 25 % (ref 10.4–31.8)
TIBC: 384 ug/dL (ref 250–450)
UIBC: 289 ug/dL (ref 148–442)

## 2023-04-16 NOTE — Progress Notes (Signed)
[facility] [phone]   [phone]  OFFICE PROGRESS NOTE  [name], MD [address] [place] [address]  DIAGNOSIS: Iron deficiency anemia and thalassemia minor  PRIOR THERAPY: None  CURRENT THERAPY: Ferrous sulfate 325 mg p.o. daily  INTERVAL HISTORY: Emily Downs 43 y.o. female turns to the clinic today for 79-month follow-up visit.Discussed the use of AI scribe software for clinical note transcription with the patient, who gave verbal consent to proceed.  History of Present Illness   The patient, a 43 year old with a history of iron deficiency anemia and thalassemia minor, has been on oral iron therapy, taken daily with vitamin C. She reports no new symptoms of fatigue, weakness, or dizziness since the last visit six months ago. The patient denies pica, specifically craving ice, which she has not experienced in years. She also denies any bleeding, either in stool or urine.  The patient has been attempting intentional weight loss and has successfully lost four pounds. She reports some constipation, presumably from the iron therapy, but manages it by increasing water intake. The patient's hemoglobin has improved from 11.4 to 12.6 since the last visit in July.        MEDICAL HISTORY: Past Medical History:  Diagnosis Date   Allergy    Beta thalassemia trait    Beta thalassemia trait    Genital herpes    Gestational diabetes    HPV (human papilloma virus) anogenital infection    Hx of chlamydia infection    Hx of pre-eclampsia in prior pregnancy, currently pregnant    Hypertension    Iron deficiency anemia    Menometrorrhagia     ALLERGIES:  has no known allergies.  MEDICATIONS:  Current Outpatient Medications  Medication Sig Dispense Refill   amLODipine (NORVASC) 10 MG tablet TAKE 1 TABLET BY MOUTH EVERY DAY 90 tablet 1   benzonatate (TESSALON PERLES) 100 MG capsule Take 1 capsule (100 mg total) by mouth 3  (three) times daily as needed. (Patient not taking: Reported on 02/07/2023) 20 capsule 0   cetirizine (ZYRTEC) 10 MG tablet Take 10 mg by mouth daily.     Iron, Ferrous Sulfate, 325 (65 Fe) MG TABS Take 325 mg by mouth daily. 30 tablet 1   metoprolol succinate (TOPROL-XL) 100 MG 24 hr tablet TAKE 1 TABLET BY MOUTH EVERY DAY 90 tablet 0   promethazine-dextromethorphan (PROMETHAZINE-DM) 6.25-15 MG/5ML syrup Take 5 mLs by mouth 4 (four) times daily as needed for cough. 118 mL 0   triamcinolone ointment (KENALOG) 0.1 % SMARTSIG:sparingly Topical Twice Daily     valACYclovir (VALTREX) 500 MG tablet Take 500 mg by mouth 2 (two) times daily.     No current facility-administered medications for this visit.    SURGICAL HISTORY:  Past Surgical History:  Procedure Laterality Date   CESAREAN SECTION N/A 07/23/2019   Procedure: CESAREAN SECTION;  Surgeon: Marlow Baars, MD;  Location: [facility];  Service: Obstetrics;  Laterality: N/A;   COLPOSCOPY     DILATION AND EVACUATION  2009   DILATION AND EVACUATION N/A 11/08/2015   Procedure: DILATATION AND EVACUATION WITH CHROMASOME STUDIES;  Surgeon: Maxie Better, MD;  Location: [facility];  Service: Gynecology;  Laterality: N/A;    REVIEW OF SYSTEMS:  A comprehensive review of systems was negative.   PHYSICAL EXAMINATION: General appearance: alert, cooperative, and no distress Head: Normocephalic, without obvious abnormality, atraumatic Neck: no adenopathy, no JVD, supple, symmetrical, trachea midline, and thyroid not  enlarged, symmetric, no tenderness/mass/nodules Lymph nodes: Cervical, supraclavicular, and axillary nodes normal. Resp: clear to auscultation bilaterally Back: symmetric, no curvature. ROM normal. No CVA tenderness. Cardio: regular rate and rhythm, S1, S2 normal, no murmur, click, rub or gallop GI: soft, non-tender; bowel sounds normal; no masses,  no organomegaly Extremities: extremities normal, atraumatic, no cyanosis or edema  ECOG  PERFORMANCE STATUS: 1 - Symptomatic but completely ambulatory  Blood pressure 134/87, pulse 65, temperature 98 F (36.7 C), temperature source Temporal, resp. rate 15, height 5' (1.524 m), weight 162 lb (73.5 kg), SpO2 100%.  LABORATORY DATA: Lab Results  Component Value Date   WBC 6.1 04/16/2023   HGB 12.6 04/16/2023   HCT 40.1 04/16/2023   MCV 73.2 (L) 04/16/2023   PLT 369 04/16/2023      Chemistry      Component Value Date/Time   NA 139 02/07/2023 0911   NA 140 06/02/2020 1707   NA 138 03/10/2012 1315   K 4.2 02/07/2023 0911   K 4.3 03/10/2012 1315   CL 103 02/07/2023 0911   CL 105 03/10/2012 1315   CO2 31 02/07/2023 0911   CO2 28 03/10/2012 1315   BUN 10 02/07/2023 0911   BUN 13 06/02/2020 1707   BUN 12.0 03/10/2012 1315   CREATININE 0.91 02/07/2023 0911   CREATININE 0.88 05/10/2021 1315   CREATININE 0.92 07/26/2015 0942   CREATININE 0.9 03/10/2012 1315      Component Value Date/Time   CALCIUM 9.1 02/07/2023 0911   CALCIUM 8.5 03/10/2012 1315   ALKPHOS 84 02/07/2023 0911   ALKPHOS 49 03/10/2012 1315   AST 26 02/07/2023 0911   AST 23 05/10/2021 1315   AST 21 03/10/2012 1315   ALT 19 02/07/2023 0911   ALT 16 05/10/2021 1315   ALT 16 03/10/2012 1315   BILITOT 0.5 02/07/2023 0911   BILITOT 0.4 05/10/2021 1315   BILITOT 0.28 03/10/2012 1315       RADIOGRAPHIC STUDIES: No results found.  ASSESSMENT AND PLAN: This is a very pleasant 43 years old African-American female with history of iron deficiency anemia as well as thalassemia minor.  The patient is currently on treatment with ferrous sulfate 325 mg p.o. daily.  She is tolerating this treatment fairly well.    Iron Deficiency Anemia Iron deficiency anemia is managed with daily oral iron tablets and vitamin C. No new symptoms such as fatigue, weakness, dizziness, or pica. No evidence of gastrointestinal bleeding or unintentional weight loss. Constipation managed by increased water intake. Recent CBC shows  hemoglobin at 12.6 g/dL, improved from 16.1 g/dL in July. Awaiting iron study results. Emphasized the importance of continuing the current regimen and monitoring for new symptoms. - Continue oral iron tablets with vitamin C daily - Await iron study results - Follow-up in six months unless contacted earlier due to concerning findings.   The patient was advised to call immediately if she has any other concerning symptoms in the interval. The patient voices understanding of current disease status and treatment options and is in agreement with the current care plan.  All questions were answered. The patient knows to call the clinic with any problems, questions or concerns. We can certainly see the patient much sooner if necessary.  The total time spent in the appointment was 20 minutes.  Disclaimer: This note was dictated with voice recognition software. Similar sounding words can inadvertently be transcribed and may not be corrected upon review.

## 2023-04-17 LAB — FERRITIN: Ferritin: 24 ng/mL (ref 11–307)

## 2023-05-20 ENCOUNTER — Other Ambulatory Visit: Payer: Self-pay | Admitting: Emergency Medicine

## 2023-05-21 ENCOUNTER — Encounter: Payer: Self-pay | Admitting: Emergency Medicine

## 2023-05-22 ENCOUNTER — Other Ambulatory Visit: Payer: Self-pay | Admitting: Radiology

## 2023-05-22 ENCOUNTER — Other Ambulatory Visit: Payer: Self-pay | Admitting: Emergency Medicine

## 2023-05-22 MED ORDER — LEVOCETIRIZINE DIHYDROCHLORIDE 5 MG PO TABS
5.0000 mg | ORAL_TABLET | Freq: Every evening | ORAL | 1 refills | Status: DC
Start: 2023-05-22 — End: 2023-05-22

## 2023-05-22 MED ORDER — LEVOCETIRIZINE DIHYDROCHLORIDE 5 MG PO TABS: 5.0000 mg | ORAL_TABLET | Freq: Every evening | ORAL | 1 refills | Status: DC

## 2023-05-22 NOTE — Telephone Encounter (Signed)
 New prescription for levocetirizine sent to pharmacy of record today.  Thanks.

## 2023-10-01 ENCOUNTER — Other Ambulatory Visit: Payer: Self-pay | Admitting: Obstetrics

## 2023-10-01 DIAGNOSIS — N6489 Other specified disorders of breast: Secondary | ICD-10-CM

## 2023-10-07 ENCOUNTER — Institutional Professional Consult (permissible substitution) (INDEPENDENT_AMBULATORY_CARE_PROVIDER_SITE_OTHER): Admitting: Otolaryngology

## 2023-10-08 ENCOUNTER — Ambulatory Visit (INDEPENDENT_AMBULATORY_CARE_PROVIDER_SITE_OTHER): Admitting: Otolaryngology

## 2023-10-16 ENCOUNTER — Inpatient Hospital Stay: Payer: 59 | Attending: Internal Medicine

## 2023-10-16 ENCOUNTER — Inpatient Hospital Stay (HOSPITAL_BASED_OUTPATIENT_CLINIC_OR_DEPARTMENT_OTHER): Payer: 59 | Admitting: Internal Medicine

## 2023-10-16 VITALS — BP 132/85 | HR 64 | Temp 97.9°F | Resp 16 | Ht 60.0 in | Wt 169.0 lb

## 2023-10-16 DIAGNOSIS — Z79899 Other long term (current) drug therapy: Secondary | ICD-10-CM | POA: Diagnosis not present

## 2023-10-16 DIAGNOSIS — I10 Essential (primary) hypertension: Secondary | ICD-10-CM | POA: Insufficient documentation

## 2023-10-16 DIAGNOSIS — D563 Thalassemia minor: Secondary | ICD-10-CM | POA: Diagnosis not present

## 2023-10-16 DIAGNOSIS — D509 Iron deficiency anemia, unspecified: Secondary | ICD-10-CM | POA: Diagnosis present

## 2023-10-16 DIAGNOSIS — D508 Other iron deficiency anemias: Secondary | ICD-10-CM | POA: Diagnosis not present

## 2023-10-16 DIAGNOSIS — Z79624 Long term (current) use of inhibitors of nucleotide synthesis: Secondary | ICD-10-CM | POA: Insufficient documentation

## 2023-10-16 LAB — IRON AND IRON BINDING CAPACITY (CC-WL,HP ONLY)
Iron: 233 ug/dL — ABNORMAL HIGH (ref 28–170)
Saturation Ratios: 67 % — ABNORMAL HIGH (ref 10.4–31.8)
TIBC: 347 ug/dL (ref 250–450)
UIBC: 114 ug/dL — ABNORMAL LOW (ref 148–442)

## 2023-10-16 LAB — CBC WITH DIFFERENTIAL (CANCER CENTER ONLY)
Abs Immature Granulocytes: 0.01 K/uL (ref 0.00–0.07)
Basophils Absolute: 0 K/uL (ref 0.0–0.1)
Basophils Relative: 1 %
Eosinophils Absolute: 0.2 K/uL (ref 0.0–0.5)
Eosinophils Relative: 3 %
HCT: 35.6 % — ABNORMAL LOW (ref 36.0–46.0)
Hemoglobin: 11.2 g/dL — ABNORMAL LOW (ref 12.0–15.0)
Immature Granulocytes: 0 %
Lymphocytes Relative: 38 %
Lymphs Abs: 2.3 K/uL (ref 0.7–4.0)
MCH: 22.9 pg — ABNORMAL LOW (ref 26.0–34.0)
MCHC: 31.5 g/dL (ref 30.0–36.0)
MCV: 72.7 fL — ABNORMAL LOW (ref 80.0–100.0)
Monocytes Absolute: 0.3 K/uL (ref 0.1–1.0)
Monocytes Relative: 6 %
Neutro Abs: 3.2 K/uL (ref 1.7–7.7)
Neutrophils Relative %: 52 %
Platelet Count: 345 K/uL (ref 150–400)
RBC: 4.9 MIL/uL (ref 3.87–5.11)
RDW: 16.3 % — ABNORMAL HIGH (ref 11.5–15.5)
WBC Count: 6 K/uL (ref 4.0–10.5)
nRBC: 0 % (ref 0.0–0.2)

## 2023-10-16 NOTE — Progress Notes (Signed)
 [facility] Telephone:([phone]   [phone]  OFFICE PROGRESS NOTE  Purcell Emil Schanz, MD [address] [place] [address]  DIAGNOSIS: Iron  deficiency anemia and thalassemia minor  PRIOR THERAPY: None  CURRENT THERAPY: Ferrous sulfate  325 mg p.o. daily  INTERVAL HISTORY: Emily Downs 43 y.o. female turns to the clinic today for 73-month follow-up visit.Discussed the use of AI scribe software for clinical note transcription with the patient, who gave verbal consent to proceed.  History of Present Illness Emily Downs is a 43 year old female with iron  deficiency anemia and thalassemia minor who presents for evaluation and repeat blood work.  She has been feeling well since her last visit with no new symptoms. No fatigue, dizziness, or shortness of breath. Her current hemoglobin is 11.2, hematocrit is 35.6, and MCV is 72.7.  She has a history of iron  deficiency anemia and thalassemia minor. She has been taking iron  tablets but has been inconsistent with her medication in the last couple of weeks.  She inquired about the difference between liquid iron  and iron  tablets, expressing interest in the potential benefits of liquid iron  for stomach comfort.      MEDICAL HISTORY: Past Medical History:  Diagnosis Date   Allergy    Beta thalassemia trait    Beta thalassemia trait    Genital herpes    Gestational diabetes    HPV (human papilloma virus) anogenital infection    Hx of chlamydia infection    Hx of pre-eclampsia in prior pregnancy, currently pregnant    Hypertension    Iron  deficiency anemia    Menometrorrhagia     ALLERGIES:  has no known allergies.  MEDICATIONS:  Current Outpatient Medications  Medication Sig Dispense Refill   amLODipine  (NORVASC ) 10 MG tablet TAKE 1 TABLET BY MOUTH EVERY DAY 90 tablet 3   benzonatate  (TESSALON  PERLES) 100 MG capsule Take 1 capsule (100 mg total) by mouth 3 (three) times  daily as needed. (Patient not taking: Reported on 02/07/2023) 20 capsule 0   Iron , Ferrous Sulfate , 325 (65 Fe) MG TABS Take 325 mg by mouth daily. 30 tablet 1   levocetirizine (XYZAL ) 5 MG tablet Take 1 tablet (5 mg total) by mouth every evening. 90 tablet 1   metoprolol  succinate (TOPROL -XL) 100 MG 24 hr tablet TAKE 1 TABLET BY MOUTH EVERY DAY 90 tablet 3   promethazine -dextromethorphan (PROMETHAZINE -DM) 6.25-15 MG/5ML syrup Take 5 mLs by mouth 4 (four) times daily as needed for cough. 118 mL 0   triamcinolone  ointment (KENALOG ) 0.1 % SMARTSIG:sparingly Topical Twice Daily     valACYclovir (VALTREX) 500 MG tablet Take 500 mg by mouth 2 (two) times daily.     No current facility-administered medications for this visit.    SURGICAL HISTORY:  Past Surgical History:  Procedure Laterality Date   CESAREAN SECTION N/A 07/23/2019   Procedure: CESAREAN SECTION;  Surgeon: Gretta Gums, MD;  Location: [facility];  Service: Obstetrics;  Laterality: N/A;   COLPOSCOPY     DILATION AND EVACUATION  2009   DILATION AND EVACUATION N/A 11/08/2015   Procedure: DILATATION AND EVACUATION WITH CHROMASOME STUDIES;  Surgeon: Dickie Carder, MD;  Location: [facility] ORS;  Service: Gynecology;  Laterality: N/A;    REVIEW OF SYSTEMS:  A comprehensive review of systems was negative.   PHYSICAL EXAMINATION: General appearance: alert, cooperative, and no distress Head: Normocephalic, without obvious abnormality, atraumatic Neck: no adenopathy, no JVD, supple, symmetrical, trachea midline, and  thyroid not enlarged, symmetric, no tenderness/mass/nodules Lymph nodes: Cervical, supraclavicular, and axillary nodes normal. Resp: clear to auscultation bilaterally Back: symmetric, no curvature. ROM normal. No CVA tenderness. Cardio: regular rate and rhythm, S1, S2 normal, no murmur, click, rub or gallop GI: soft, non-tender; bowel sounds normal; no masses,  no organomegaly Extremities: extremities normal, atraumatic, no  cyanosis or edema  ECOG PERFORMANCE STATUS: 1 - Symptomatic but completely ambulatory  Blood pressure 132/85, pulse 64, temperature 97.9 F (36.6 C), temperature source Temporal, resp. rate 16, height 5' (1.524 m), weight 169 lb (76.7 kg), SpO2 100%.  LABORATORY DATA: Lab Results  Component Value Date   WBC 6.0 10/16/2023   HGB 11.2 (L) 10/16/2023   HCT 35.6 (L) 10/16/2023   MCV 72.7 (L) 10/16/2023   PLT 345 10/16/2023      Chemistry      Component Value Date/Time   NA 139 02/07/2023 0911   NA 140 06/02/2020 1707   NA 138 03/10/2012 1315   K 4.2 02/07/2023 0911   K 4.3 03/10/2012 1315   CL 103 02/07/2023 0911   CL 105 03/10/2012 1315   CO2 31 02/07/2023 0911   CO2 28 03/10/2012 1315   BUN 10 02/07/2023 0911   BUN 13 06/02/2020 1707   BUN 12.0 03/10/2012 1315   CREATININE 0.91 02/07/2023 0911   CREATININE 0.88 05/10/2021 1315   CREATININE 0.92 07/26/2015 0942   CREATININE 0.9 03/10/2012 1315      Component Value Date/Time   CALCIUM  9.1 02/07/2023 0911   CALCIUM  8.5 03/10/2012 1315   ALKPHOS 84 02/07/2023 0911   ALKPHOS 49 03/10/2012 1315   AST 26 02/07/2023 0911   AST 23 05/10/2021 1315   AST 21 03/10/2012 1315   ALT 19 02/07/2023 0911   ALT 16 05/10/2021 1315   ALT 16 03/10/2012 1315   BILITOT 0.5 02/07/2023 0911   BILITOT 0.4 05/10/2021 1315   BILITOT 0.28 03/10/2012 1315       RADIOGRAPHIC STUDIES: No results found.  ASSESSMENT AND PLAN: This is a very pleasant 43 years old African-American female with history of iron  deficiency anemia as well as thalassemia minor.  The patient is currently on treatment with ferrous sulfate  325 mg p.o. daily.  She is tolerating this treatment fairly well. She was not compliant with the oral iron  tablets in the last few weeks. Repeat CBC today showed decrease in her hemoglobin and hematocrit. Assessment and Plan Assessment & Plan Iron  deficiency anemia Iron  deficiency anemia with hemoglobin at 11.2, hematocrit at 35.6,  and low MCV at 72.7, indicating microcytic anemia. Inconsistent iron  tablet intake may contribute to current levels. Awaiting iron  studies to determine iron  levels. - Resume iron  tablets - Consider iron  infusion if iron  levels are very low - Encourage intake of vitamin C or orange juice to enhance iron  absorption - Await results of iron  studies  Thalassemia minor Thalassemia minor with no significant symptoms such as fatigue, dizziness, or shortness of breath, indicating adaptation to chronic anemia. She was advised to call immediately if she has any concerning symptoms in the interval. The patient voices understanding of current disease status and treatment options and is in agreement with the current care plan.  All questions were answered. The patient knows to call the clinic with any problems, questions or concerns. We can certainly see the patient much sooner if necessary.  The total time spent in the appointment was 20 minutes.  Disclaimer: This note was dictated with voice recognition software. Similar sounding  words can inadvertently be transcribed and may not be corrected upon review.

## 2023-10-17 LAB — FERRITIN: Ferritin: 35 ng/mL (ref 11–307)

## 2023-10-18 ENCOUNTER — Encounter: Payer: Self-pay | Admitting: Internal Medicine

## 2023-10-18 ENCOUNTER — Encounter (INDEPENDENT_AMBULATORY_CARE_PROVIDER_SITE_OTHER): Payer: Self-pay | Admitting: Otolaryngology

## 2023-10-18 ENCOUNTER — Ambulatory Visit (INDEPENDENT_AMBULATORY_CARE_PROVIDER_SITE_OTHER): Admitting: Otolaryngology

## 2023-10-18 ENCOUNTER — Telehealth: Payer: Self-pay | Admitting: Physician Assistant

## 2023-10-18 VITALS — BP 136/85 | HR 71

## 2023-10-18 DIAGNOSIS — K219 Gastro-esophageal reflux disease without esophagitis: Secondary | ICD-10-CM

## 2023-10-18 DIAGNOSIS — J3089 Other allergic rhinitis: Secondary | ICD-10-CM

## 2023-10-18 DIAGNOSIS — R09A2 Foreign body sensation, throat: Secondary | ICD-10-CM | POA: Diagnosis not present

## 2023-10-18 DIAGNOSIS — R0982 Postnasal drip: Secondary | ICD-10-CM

## 2023-10-18 DIAGNOSIS — R0981 Nasal congestion: Secondary | ICD-10-CM

## 2023-10-18 MED ORDER — FAMOTIDINE 20 MG PO TABS: 20.0000 mg | ORAL_TABLET | Freq: Two times a day (BID) | ORAL | 1 refills | Status: AC

## 2023-10-18 NOTE — Progress Notes (Signed)
 ENT CONSULT:  Reason for Consult: chronic globus sensation   HPI: Discussed the use of AI scribe software for clinical note transcription with the patient, who gave verbal consent to proceed.  History of Present Illness Emily Downs is a 43 year old female who presents with a sensation of something in her throat.   She has been experiencing a sensation of something in her throat for the past few months without any preceding illness or procedures. She has persistent allergies and nasal drainage, contributing to a feeling of constant stuffiness.  She is currently taking Xyzal  for her allergies but has not been using nasal sprays like Flonase . No history of heartburn or reflux is reported, and she has never smoked.   Records Reviewed:  Dr Purcell PCP office visit 02/07/23  This is a 43 y.o. female here for 57-month follow-up of hypertension Also complaining of flulike symptoms with cough and congestion for the past 4 to 5 weeks.  Progressively getting worse. No other complaints or medical concerns today.    Past Medical History:  Diagnosis Date   Allergy    Beta thalassemia trait    Beta thalassemia trait    Genital herpes    Gestational diabetes    HPV (human papilloma virus) anogenital infection    Hx of chlamydia infection    Hx of pre-eclampsia in prior pregnancy, currently pregnant    Hypertension    Iron  deficiency anemia    Menometrorrhagia     Past Surgical History:  Procedure Laterality Date   CESAREAN SECTION N/A 07/23/2019   Procedure: CESAREAN SECTION;  Surgeon: Gretta Gums, MD;  Location: [facility];  Service: Obstetrics;  Laterality: N/A;   COLPOSCOPY     DILATION AND EVACUATION  2009   DILATION AND EVACUATION N/A 11/08/2015   Procedure: DILATATION AND EVACUATION WITH CHROMASOME STUDIES;  Surgeon: Dickie Carder, MD;  Location: [facility]S;  Service: Gynecology;  Laterality: N/A;    Family History  Problem Relation Age of Onset   Hypertension  Mother    Diabetes Mother    Aneurysm Mother 28       brain aneurysm   Sarcoidosis Mother    Hyperlipidemia Father    Crohn's disease Sister    Hypertension Sister    Diabetes Maternal Grandmother    Stroke Maternal Grandmother    Breast cancer Paternal Grandmother    Cancer Paternal Grandmother     Social History:  reports that she has never smoked. She has never used smokeless tobacco. She reports that she does not drink alcohol and does not use drugs.  Allergies: No Known Allergies  Medications: I have reviewed the patient's current medications.  The PMH, PSH, Medications, Allergies, and SH were reviewed and updated.  ROS: Constitutional: Negative for fever, weight loss and weight gain. Cardiovascular: Negative for chest pain and dyspnea on exertion. Respiratory: Is not experiencing shortness of breath at rest. Gastrointestinal: Negative for nausea and vomiting. Neurological: Negative for headaches. Psychiatric: The patient is not nervous/anxious  Blood pressure 136/85, pulse 71, SpO2 98%. There is no height or weight on file to calculate BMI.  PHYSICAL EXAM:  Exam: General: Well-developed, well-nourished Respiratory Respiratory effort: Equal inspiration and expiration without stridor Cardiovascular Peripheral Vascular: Warm extremities with equal color/perfusion Eyes: No nystagmus with equal extraocular motion bilaterally Neuro/Psych/Balance: Patient oriented to person, place, and time; Appropriate mood and affect; Gait is intact with no imbalance; Cranial nerves I-XII are intact Head and Face Inspection: Normocephalic and atraumatic without mass  or lesion Palpation: Facial skeleton intact without bony stepoffs Salivary Glands: No mass or tenderness Facial Strength: Facial motility symmetric and full bilaterally ENT Pinna: External ear intact and fully developed External canal: Canal is patent with intact skin Tympanic Membrane: Clear and mobile External Nose:  No scar or anatomic deformity Internal Nose: Septum is deviated to the left. No polyp, or purulence. Mucosal edema and erythema present.  Bilateral inferior turbinate hypertrophy.  Lips, Teeth, and gums: Mucosa and teeth intact and viable TMJ: No pain to palpation with full mobility Oral cavity/oropharynx: No erythema or exudate, no lesions present 2 + tonsils Nasopharynx: No mass or lesion with intact mucosa Hypopharynx: Intact mucosa without pooling of secretions Larynx Glottic: Full true vocal cord mobility without lesion or mass Supraglottic: Normal appearing epiglottis and AE folds Interarytenoid Space: Moderate pachydermia&edema Subglottic Space: Patent without lesion or edema Neck Neck and Trachea: Midline trachea without mass or lesion Thyroid: No mass or nodularity Lymphatics: No lymphadenopathy  Procedure: Preoperative diagnosis: globus sensation  Postoperative diagnosis:   Same + GERD LPR  Procedure: Flexible fiberoptic laryngoscopy  Surgeon: Elena Larry, MD  Anesthesia: Topical lidocaine  and Afrin Complications: None Condition is stable throughout exam  Indications and consent:  The patient presents to the clinic with above symptoms. Indirect laryngoscopy view was incomplete. Thus it was recommended that they undergo a flexible fiberoptic laryngoscopy. All of the risks, benefits, and potential complications were reviewed with the patient preoperatively and verbal informed consent was obtained.  Procedure: The patient was seated upright in the clinic. Topical lidocaine  and Afrin were applied to the nasal cavity. After adequate anesthesia had occurred, I then proceeded to pass the flexible telescope into the nasal cavity. The nasal cavity was patent without rhinorrhea or polyp. The nasopharynx was also patent without mass or lesion. The base of tongue was visualized and was normal. There were no signs of pooling of secretions in the piriform sinuses. The true vocal  folds were mobile bilaterally. There were no signs of glottic or supraglottic mucosal lesion or mass. There was moderate interarytenoid pachydermia and post cricoid edema. The telescope was then slowly withdrawn and the patient tolerated the procedure throughout.   Studies Reviewed: CT head 02/06/21 Sinuses/Orbits: The orbits and orbital contents are unremarkable. There is mild membrane thickening in the right maxillary sinus. Other visualized sinuses and mastoid air cells are clear.  Assessment/Plan: Encounter Diagnoses  Name Primary?   Globus sensation Yes   Chronic GERD    Chronic nasal congestion    Post-nasal drip    Environmental and seasonal allergies     Assessment and Plan Assessment & Plan Globus sensation GERD LPR Reflux irritation observed on scope with globus sensation likely due to untreated reflux. Scope exam is otherwise unremarkable - Pepcid 20 mg BID  -  Reflux Gourmet after meals - diet and lifestyle changes to minimize GERD - Refer to [employer] blog for dietary and lifestyle modifications/reflux cook book  Chronic nasal congestion and post-nasal drainage. Suspected environmental allergies.  Chronic allergic rhinitis with nasal congestion and sinus issues managed with Xyzal . - Continue Xyzal  5 mg daily - Recommend considering nasal sprays like Flonase  for additional relief. - consider allergy testing in the future (PCP can refer to Asthma and Allergy)   Thank you for allowing me to participate in the care of this patient. Please do not hesitate to contact me with any questions or concerns.   Elena Larry, MD Otolaryngology [facility] Phone: [phone] Fax: [phone]  10/18/2023, 9:22 AM

## 2023-10-18 NOTE — Patient Instructions (Signed)

## 2023-10-18 NOTE — Telephone Encounter (Signed)
 I called the patient to review her iron  labs due to her concerns she expressed on mychart.

## 2023-12-04 ENCOUNTER — Ambulatory Visit
Admission: RE | Admit: 2023-12-04 | Discharge: 2023-12-04 | Disposition: A | Discharge status: Home / Self Care | Source: Ambulatory Visit | Attending: Obstetrics

## 2023-12-04 DIAGNOSIS — N6489 Other specified disorders of breast: Secondary | ICD-10-CM

## 2024-03-18 ENCOUNTER — Emergency Department (HOSPITAL_COMMUNITY)
Admission: EM | Admit: 2024-03-18 | Discharge: 2024-03-19 | Disposition: A | Discharge status: Home / Self Care | Attending: Emergency Medicine | Admitting: Emergency Medicine

## 2024-03-18 ENCOUNTER — Other Ambulatory Visit: Payer: Self-pay

## 2024-03-18 DIAGNOSIS — J101 Influenza due to other identified influenza virus with other respiratory manifestations: Secondary | ICD-10-CM | POA: Insufficient documentation

## 2024-03-18 DIAGNOSIS — Z79899 Other long term (current) drug therapy: Secondary | ICD-10-CM | POA: Insufficient documentation

## 2024-03-18 DIAGNOSIS — I10 Essential (primary) hypertension: Secondary | ICD-10-CM | POA: Insufficient documentation

## 2024-03-18 DIAGNOSIS — R000 Tachycardia, unspecified: Secondary | ICD-10-CM | POA: Diagnosis not present

## 2024-03-18 DIAGNOSIS — R059 Cough, unspecified: Secondary | ICD-10-CM | POA: Diagnosis present

## 2024-03-18 LAB — RESP PANEL BY RT-PCR (RSV, FLU A&B, COVID)  RVPGX2
Influenza A by PCR: POSITIVE — AB
Influenza B by PCR: NEGATIVE
Resp Syncytial Virus by PCR: NEGATIVE
SARS Coronavirus 2 by RT PCR: NEGATIVE

## 2024-03-18 MED ORDER — BENZONATATE 100 MG PO CAPS: 100.0000 mg | ORAL_CAPSULE | Freq: Three times a day (TID) | ORAL | 0 refills | Status: AC | PRN

## 2024-03-18 MED ORDER — ACETAMINOPHEN 500 MG PO TABS
1000.0000 mg | ORAL_TABLET | Freq: Once | ORAL | Status: AC
  Administered 2024-03-18: 1000 mg via ORAL
  Filled 2024-03-18: qty 2

## 2024-03-18 NOTE — ED Triage Notes (Signed)
 Pt POV d/t flu-like s/s.  Pt has cough, runny nose and fever.  Pt states she had 102.1 temp at home and took 800 mg PO Ibuprofen .  Pt works at [employer] and someone there had the flu.

## 2024-03-18 NOTE — Discharge Instructions (Addendum)
 Is a pleasure taking care of you today.  Your workup was reassuring.  You did test positive for influenza A today.  As discussed, the flu is a virus, so antibiotics are not indicated.  I recommend taking Tylenol  and/or ibuprofen  every 6 hours as needed for fever and bodyaches.  I also sent some Tessalon  Perles to the [facility] pharmacy on [address].  Continue drinking water and/or Gatorade for hydration and getting plenty of rest.  Please follow-up with your PCP if your symptoms worsen.  Return to the Emergency Department if you experience any life-threatening symptoms such as chest pain, shortness of breath, seizures or any other life-threatening illness.

## 2024-03-18 NOTE — ED Provider Notes (Signed)
 "  EMERGENCY DEPARTMENT AT [facility] Provider Note   CSN: [id number] Arrival date & time: 03/18/24  2031     Patient presents with: Generalized Body Aches   Emily Downs is a 43 y.o. female with past medical history of hypertension, who presents emergency department for evaluation of bodyaches.  Patient reports that one of her coworkers is sick with the flu, and she thinks she may have gotten it from her.  Patient reports a fever as well, Tmax 102.1.  She states she did take ibuprofen  prior to arrival around 7:45 PM.  She is also endorsing a cough, congestion.  She denies any nausea or vomiting.  Patient reports good appetite as well.  No history of asthma or COPD.  HPI     Prior to Admission medications  Medication Sig Start Date End Date Taking? Authorizing Provider  amLODipine  (NORVASC ) 10 MG tablet TAKE 1 TABLET BY MOUTH EVERY DAY 05/20/23   Purcell Emil Schanz, MD  benzonatate  (TESSALON  PERLES) 100 MG capsule Take 1 capsule (100 mg total) by mouth 3 (three) times daily as needed. 03/18/24   Gayla Benn, Marry RAMAN, PA-C  famotidine  (PEPCID ) 20 MG tablet Take 1 tablet (20 mg total) by mouth 2 (two) times daily. 10/18/23   Soldatova, Liuba, MD  Iron , Ferrous Sulfate , 325 (65 Fe) MG TABS Take 325 mg by mouth daily. 04/21/21   Gray, Sarah E, NP  levocetirizine (XYZAL ) 5 MG tablet Take 1 tablet (5 mg total) by mouth every evening. 05/22/23   Purcell Emil Schanz, MD  metoprolol  succinate (TOPROL -XL) 100 MG 24 hr tablet TAKE 1 TABLET BY MOUTH EVERY DAY 05/20/23   Purcell Emil Schanz, MD  promethazine -dextromethorphan (PROMETHAZINE -DM) 6.25-15 MG/5ML syrup Take 5 mLs by mouth 4 (four) times daily as needed for cough. 01/11/22   Wendolyn Jenkins Jansky, MD  triamcinolone  ointment (KENALOG ) 0.1 % SMARTSIG:sparingly Topical Twice Daily 12/24/21   [provider]  valACYclovir (VALTREX) 500 MG tablet Take 500 mg by mouth 2 (two) times daily. 12/24/21   [provider]    Allergies: Patient has no known allergies.    Review of Systems  Constitutional:  Negative for chills and fever.  HENT:  Positive for congestion.   Respiratory:  Positive for cough.   Skin:  Negative for color change and rash.  All other systems reviewed and are negative.   Updated Vital Signs BP 131/83 (BP Location: Right Arm)   Pulse (!) 107   Temp 99.2 F (37.3 C)   Resp 18   LMP 03/04/2024 (Approximate)   SpO2 99%   Physical Exam Vitals and nursing note reviewed.  Constitutional:      Appearance: Normal appearance.  HENT:     Head: Normocephalic and atraumatic.     Mouth/Throat:     Mouth: Mucous membranes are moist.  Eyes:     General: No scleral icterus.       Right eye: No discharge.        Left eye: No discharge.     Conjunctiva/sclera: Conjunctivae normal.  Cardiovascular:     Rate and Rhythm: Regular rhythm. Tachycardia present.     Pulses: Normal pulses.  Pulmonary:     Effort: Pulmonary effort is normal.     Breath sounds: Normal breath sounds.  Abdominal:     General: There is no distension.     Tenderness: There is no abdominal tenderness.  Musculoskeletal:        General: No deformity.  Cervical back: Normal range of motion.  Skin:    General: Skin is warm and dry.     Capillary Refill: Capillary refill takes less than 2 seconds.  Neurological:     Mental Status: She is alert.     Motor: No weakness.  Psychiatric:        Mood and Affect: Mood normal.     (all labs ordered are listed, but only abnormal results are displayed) Labs Reviewed  RESP PANEL BY RT-PCR (RSV, FLU A&B, COVID)  RVPGX2 - Abnormal; Notable for the following components:      Result Value   Influenza A by PCR POSITIVE (*)    All other components within normal limits    EKG: None  Radiology: No results found.  Procedures   Medications Ordered in the ED  acetaminophen  (TYLENOL ) tablet 1,000 mg (1,000 mg Oral Given 03/18/24 2214)                                 Medical Decision Making Risk OTC drugs. Prescription drug management.   43 year old female who presents emergency department for evaluation of flulike symptoms.  Differential diagnosis: Flu, COVID, RSV, PE, pneumonia, asthma exacerbation.  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  Her respiratory panel is positive for the flu as well.  Her lung sounds are clear, and she has no history of asthma to suggest additional etiology.  Patient was initially tachycardic upon arrival, however after Tylenol  administration, her heart rate has normalized.  Patient education provided around taking Tylenol  and or ibuprofen  as needed for fever and bodyaches.  I also encouraged patient to remain hydrated with water and Gatorade or Pedialyte.  Patient verbalized understanding to this.  Patient's vital signs are stable.  Patient is appropriate for discharge at this time.   Final diagnoses:  Influenza A    ED Discharge Orders          Ordered    benzonatate  (TESSALON  PERLES) 100 MG capsule  3 times daily PRN        03/18/24 2248               Melisa Donofrio S, PA-C 03/18/24 2253    Pamella Ozell LABOR, DO 03/22/24 2007  "

## 2024-03-25 ENCOUNTER — Ambulatory Visit: Admitting: Emergency Medicine

## 2024-03-25 VITALS — BP 120/80 | HR 74 | Temp 98.0°F | Ht 60.0 in | Wt 162.0 lb

## 2024-03-25 DIAGNOSIS — R053 Chronic cough: Secondary | ICD-10-CM | POA: Diagnosis not present

## 2024-03-25 DIAGNOSIS — J111 Influenza due to unidentified influenza virus with other respiratory manifestations: Secondary | ICD-10-CM | POA: Insufficient documentation

## 2024-03-25 DIAGNOSIS — R6889 Other general symptoms and signs: Secondary | ICD-10-CM | POA: Diagnosis not present

## 2024-03-25 MED ORDER — HYDROCODONE BIT-HOMATROP MBR 5-1.5 MG/5ML PO SOLN: 5.0000 mL | Freq: Every evening | ORAL | 0 refills | Status: AC | PRN

## 2024-03-25 NOTE — Patient Instructions (Signed)

## 2024-03-25 NOTE — Assessment & Plan Note (Signed)
 Cough management discussed Continue over-the-counter Mucinex DM as needed Advised to rest and stay well-hydrated Hycodan syrup at bedtime as needed

## 2024-03-25 NOTE — Progress Notes (Signed)
 Emily Downs 43 y.o.   Chief Complaint  Patient presents with   Cough    Pt states that she is getting over the flu and but the cough is lingering     HISTORY OF PRESENT ILLNESS: Acute problem visit today. This is a 43 y.o. female complaining of persistent dry cough Seen in the emergency department on 03/18/2024 and diagnosed with influenza.  Son was also diagnosed with influenza Feeling much better but still having some lingering cough No other complaints or medical concerns today.  Cough Pertinent negatives include no chest pain, chills, fever, headaches, rash or sore throat.     Prior to Admission medications  Medication Sig Start Date End Date Taking? Authorizing Provider  amLODipine  (NORVASC ) 10 MG tablet TAKE 1 TABLET BY MOUTH EVERY DAY 05/20/23  Yes Brandii Lakey, Emil Schanz, MD  benzonatate  (TESSALON  PERLES) 100 MG capsule Take 1 capsule (100 mg total) by mouth 3 (three) times daily as needed. 03/18/24  Yes Dufour, Marry RAMAN, PA-C  famotidine  (PEPCID ) 20 MG tablet Take 1 tablet (20 mg total) by mouth 2 (two) times daily. 10/18/23  Yes Soldatova, Liuba, MD  HYDROcodone bit-homatropine (HYCODAN) 5-1.5 MG/5ML syrup Take 5 mLs by mouth at bedtime as needed for cough. 03/25/24  Yes Ayaan Shutes, Emil Schanz, MD  Iron , Ferrous Sulfate , 325 (65 Fe) MG TABS Take 325 mg by mouth daily. 04/21/21  Yes Gray, Sarah E, NP  levocetirizine (XYZAL ) 5 MG tablet Take 1 tablet (5 mg total) by mouth every evening. 05/22/23  Yes Adynn Caseres, Emil Schanz, MD  metoprolol  succinate (TOPROL -XL) 100 MG 24 hr tablet TAKE 1 TABLET BY MOUTH EVERY DAY 05/20/23  Yes Keyonna Comunale, Emil Schanz, MD  promethazine -dextromethorphan (PROMETHAZINE -DM) 6.25-15 MG/5ML syrup Take 5 mLs by mouth 4 (four) times daily as needed for cough. 01/11/22   Wendolyn Jenkins Jansky, MD  triamcinolone  ointment (KENALOG ) 0.1 % SMARTSIG:sparingly Topical Twice Daily Patient not taking: Reported on 03/25/2024 12/24/21   [provider]   valACYclovir (VALTREX) 500 MG tablet Take 500 mg by mouth 2 (two) times daily. 12/24/21   [provider]    Allergies[1]  Patient Active Problem List   Diagnosis Date Noted   Lower respiratory infection 02/07/2023   Flu-like symptoms 02/07/2023   Essential hypertension, benign 12/11/2013   Menometrorrhagia    Iron  deficiency anemia     Past Medical History:  Diagnosis Date   Allergy    Beta thalassemia trait    Beta thalassemia trait    Genital herpes    Gestational diabetes    HPV (human papilloma virus) anogenital infection    Hx of chlamydia infection    Hx of pre-eclampsia in prior pregnancy, currently pregnant    Hypertension    Iron  deficiency anemia    Menometrorrhagia     Past Surgical History:  Procedure Laterality Date   CESAREAN SECTION N/A 07/23/2019   Procedure: CESAREAN SECTION;  Surgeon: Gretta Gums, MD;  Location: [facility];  Service: Obstetrics;  Laterality: N/A;   COLPOSCOPY     DILATION AND EVACUATION  2009   DILATION AND EVACUATION N/A 11/08/2015   Procedure: DILATATION AND EVACUATION WITH CHROMASOME STUDIES;  Surgeon: Dickie Carder, MD;  Location: [facility];  Service: Gynecology;  Laterality: N/A;    Social History   Socioeconomic History   Marital status: Married    Spouse name: Thursa Emme   Number of children: 1   Years of education: MBA   Highest education level: Master's degree (e.g., MA, MS, MEng,  MEd, MSW, MBA)  Occupational History   Occupation: social worker   Occupation: residence naval architect    Comment: [employer] (04/23/2016)  Tobacco Use   Smoking status: Never   Smokeless tobacco: Never  Vaping Use   Vaping status: Never Used  Substance and Sexual Activity   Alcohol use: No   Drug use: No   Sexual activity: Not Currently    Birth control/protection: None  Other Topics Concern   Not on file  Social History Narrative   Marital status: married x 7 years      Children: 79 yo daughter.      Lives: with  husband, daughter.      Employment:  Interior And Spatial Designer, Residence Naval Architect      Tobacco; none      Alcohol: rare to none in 2018      Drugs:  None      Exercise: none in 2018      Seatbelt: 100%   Social Drivers of Health   Tobacco Use: Low Risk (03/25/2024)   Patient History    Smoking Tobacco Use: Never    Smokeless Tobacco Use: Never    Passive Exposure: Not on file  Financial Resource Strain: Medium Risk (03/25/2024)   Overall Financial Resource Strain (CARDIA)    Difficulty of Paying Living Expenses: Somewhat hard  Food Insecurity: Food Insecurity Present (03/25/2024)   Epic    Worried About Programme Researcher, Broadcasting/film/video in the Last Year: Sometimes true    Ran Out of Food in the Last Year: Never true  Transportation Needs: No Transportation Needs (03/25/2024)   Epic    Lack of Transportation (Medical): No    Lack of Transportation (Non-Medical): No  Physical Activity: Insufficiently Active (03/25/2024)   Exercise Vital Sign    Days of Exercise per Week: 3 days    Minutes of Exercise per Session: 10 min  Stress: Stress Concern Present (03/25/2024)   [employer] of Occupational Health - Occupational Stress Questionnaire    Feeling of Stress: To some extent  Social Connections: Socially Integrated (03/25/2024)   Social Connection and Isolation Panel    Frequency of Communication with Friends and Family: More than three times a week    Frequency of Social Gatherings with Friends and Family: Never    Attends Religious Services: More than 4 times per year    Active Member of Clubs or Organizations: Yes    Attends Banker Meetings: 1 to 4 times per year    Marital Status: Married  Catering Manager Violence: Not on file  Depression (PHQ2-9): Low Risk (03/25/2024)   Depression (PHQ2-9)    PHQ-2 Score: 0  Alcohol Screen: Low Risk (03/25/2024)   Alcohol Screen    Last Alcohol Screening Score (AUDIT): 1  Housing: Low Risk (03/25/2024)   Epic    Unable to Pay for  Housing in the Last Year: No    Number of Times Moved in the Last Year: 0    Homeless in the Last Year: No  Utilities: Not on file  Health Literacy: Not on file    Family History  Problem Relation Age of Onset   Hypertension Mother    Diabetes Mother    Aneurysm Mother 14       brain aneurysm   Sarcoidosis Mother    Hyperlipidemia Father    Crohn's disease Sister    Hypertension Sister    Diabetes Maternal Grandmother    Stroke Maternal Grandmother    Breast cancer Paternal  Grandmother    Cancer Paternal Grandmother      Review of Systems  Constitutional: Negative.  Negative for chills and fever.  HENT: Negative.  Negative for congestion and sore throat.   Respiratory:  Positive for cough.   Cardiovascular: Negative.  Negative for chest pain and palpitations.  Gastrointestinal:  Negative for abdominal pain, diarrhea, nausea and vomiting.  Genitourinary: Negative.  Negative for dysuria and hematuria.  Skin: Negative.  Negative for rash.  Neurological: Negative.  Negative for dizziness and headaches.  All other systems reviewed and are negative.   Vitals:   03/25/24 1032  BP: 120/80  Pulse: 74  Temp: 98 F (36.7 C)  SpO2: 99%    Physical Exam Vitals reviewed.  Constitutional:      Appearance: Normal appearance.  HENT:     Head: Normocephalic.     Mouth/Throat:     Mouth: Mucous membranes are moist.     Pharynx: Oropharynx is clear.  Eyes:     Extraocular Movements: Extraocular movements intact.     Conjunctiva/sclera: Conjunctivae normal.     Pupils: Pupils are equal, round, and reactive to light.  Cardiovascular:     Rate and Rhythm: Normal rate and regular rhythm.     Pulses: Normal pulses.     Heart sounds: Normal heart sounds.  Pulmonary:     Effort: Pulmonary effort is normal.     Breath sounds: Normal breath sounds.  Musculoskeletal:     Cervical back: No tenderness.  Lymphadenopathy:     Cervical: No cervical adenopathy.  Skin:    General:  Skin is warm and dry.     Capillary Refill: Capillary refill takes less than 2 seconds.  Neurological:     General: No focal deficit present.     Mental Status: She is alert and oriented to person, place, and time.  Psychiatric:        Mood and Affect: Mood normal.        Behavior: Behavior normal.      ASSESSMENT & PLAN: Problem List Items Addressed This Visit       Respiratory   Influenza - Primary   Clinically stable.  Running its course without complications Much improved today. Still having lingering cough Symptom management discussed Advised to rest and stay well-hydrated        Other   Flu-like symptoms   Symptom management discussed Advised to rest and stay well-hydrated      Persistent cough   Cough management discussed Continue over-the-counter Mucinex DM as needed Advised to rest and stay well-hydrated Hycodan syrup at bedtime as needed      Relevant Medications   HYDROcodone bit-homatropine (HYCODAN) 5-1.5 MG/5ML syrup   Patient Instructions  Cough, Adult A cough helps to clear your throat and lungs. It may be a sign of an illness or another condition. A short-term (acute) cough may last 2-3 weeks. A long-term (chronic) cough may last 8 or more weeks. Many things can cause a cough. They include: Illnesses such as: An infection in your throat or lungs. Asthma or other heart or lung problems. Gastroesophageal reflux. This is when acid comes back up from your stomach. Breathing in things that bother (irritate) your lungs. Allergies. Postnasal drip. This is when mucus runs down the back of your throat. Smoking. Some medicines. Follow these instructions at home: Medicines Take over-the-counter and prescription medicines only as told by your doctor. Talk with your doctor before you take cough medicine (cough suppressants). Eating and drinking  Do not drink alcohol. Do not drink caffeine. Drink enough fluid to keep your pee (urine) pale  yellow. Lifestyle Stay away from cigarette smoke. Do not smoke or use any products that contain nicotine or tobacco. If you need help quitting, ask your doctor. Stay away from things that make you cough. These may include perfume, candles, cleaning products, or campfire smoke. General instructions  Watch for any changes to your cough. Tell your doctor about them. Always cover your mouth when you cough. If the air is dry in your home, use a cool mist vaporizer or humidifier. If your cough is worse at night, try using extra pillows to raise your head up higher while you sleep. Rest as needed. Contact a doctor if: You have new symptoms. Your symptoms get worse. You cough up pus. You have a fever that does not go away. Your cough does not get better after 2-3 weeks. Cough medicine does not help, and you are not sleeping well. You have pain that gets worse or is not helped with medicine. You are losing weight and do not know why. You have night sweats. Get help right away if: You cough up blood. You have trouble breathing. Your heart is beating very fast. These symptoms may be an emergency. Get help right away. Call 911. Do not wait to see if the symptoms will go away. Do not drive yourself to the hospital. This information is not intended to replace advice given to you by your health care provider. Make sure you discuss any questions you have with your health care provider. Document Revised: 11/10/2021 Document Reviewed: 11/10/2021 Elsevier Patient Education  2024 [employer]     Emil Schaumann, MD [facility]    [1] No Known Allergies

## 2024-03-25 NOTE — Assessment & Plan Note (Signed)
 Clinically stable.  Running its course without complications Much improved today. Still having lingering cough Symptom management discussed Advised to rest and stay well-hydrated

## 2024-03-25 NOTE — Assessment & Plan Note (Signed)
 Symptom management discussed Advised to rest and stay well-hydrated

## 2024-04-02 ENCOUNTER — Ambulatory Visit: Payer: Self-pay | Admitting: Emergency Medicine

## 2024-04-02 ENCOUNTER — Ambulatory Visit (INDEPENDENT_AMBULATORY_CARE_PROVIDER_SITE_OTHER): Admitting: Emergency Medicine

## 2024-04-02 ENCOUNTER — Encounter: Payer: Self-pay | Admitting: Emergency Medicine

## 2024-04-02 ENCOUNTER — Other Ambulatory Visit: Payer: Self-pay | Admitting: Emergency Medicine

## 2024-04-02 VITALS — BP 126/80 | HR 52 | Ht 60.0 in | Wt 163.0 lb

## 2024-04-02 DIAGNOSIS — Z13220 Encounter for screening for lipoid disorders: Secondary | ICD-10-CM

## 2024-04-02 DIAGNOSIS — Z1211 Encounter for screening for malignant neoplasm of colon: Secondary | ICD-10-CM

## 2024-04-02 DIAGNOSIS — Z130 Encounter for screening for diseases of the blood and blood-forming organs and certain disorders involving the immune mechanism: Secondary | ICD-10-CM

## 2024-04-02 DIAGNOSIS — Z13228 Encounter for screening for other metabolic disorders: Secondary | ICD-10-CM | POA: Diagnosis not present

## 2024-04-02 DIAGNOSIS — Z0000 Encounter for general adult medical examination without abnormal findings: Secondary | ICD-10-CM | POA: Diagnosis not present

## 2024-04-02 DIAGNOSIS — Z0001 Encounter for general adult medical examination with abnormal findings: Secondary | ICD-10-CM

## 2024-04-02 DIAGNOSIS — I10 Essential (primary) hypertension: Secondary | ICD-10-CM | POA: Diagnosis not present

## 2024-04-02 DIAGNOSIS — Z1329 Encounter for screening for other suspected endocrine disorder: Secondary | ICD-10-CM

## 2024-04-02 LAB — COMPREHENSIVE METABOLIC PANEL WITH GFR
ALT: 24 U/L (ref 3–35)
AST: 25 U/L (ref 5–37)
Albumin: 3.7 g/dL (ref 3.5–5.2)
Alkaline Phosphatase: 58 U/L (ref 39–117)
BUN: 13 mg/dL (ref 6–23)
CO2: 30 meq/L (ref 19–32)
Calcium: 8.5 mg/dL (ref 8.4–10.5)
Chloride: 104 meq/L (ref 96–112)
Creatinine, Ser: 0.75 mg/dL (ref 0.40–1.20)
GFR: 96.35 mL/min
Glucose, Bld: 90 mg/dL (ref 70–99)
Potassium: 3.8 meq/L (ref 3.5–5.1)
Sodium: 138 meq/L (ref 135–145)
Total Bilirubin: 0.3 mg/dL (ref 0.2–1.2)
Total Protein: 6.9 g/dL (ref 6.0–8.3)

## 2024-04-02 LAB — CBC WITH DIFFERENTIAL/PLATELET
Basophils Absolute: 0 K/uL (ref 0.0–0.1)
Basophils Relative: 0.6 % (ref 0.0–3.0)
Eosinophils Absolute: 0.2 K/uL (ref 0.0–0.7)
Eosinophils Relative: 3 % (ref 0.0–5.0)
HCT: 36.6 % (ref 36.0–46.0)
Hemoglobin: 11.5 g/dL — ABNORMAL LOW (ref 12.0–15.0)
Lymphocytes Relative: 42.1 % (ref 12.0–46.0)
Lymphs Abs: 2.2 K/uL (ref 0.7–4.0)
MCHC: 31.4 g/dL (ref 30.0–36.0)
MCV: 71.5 fl — ABNORMAL LOW (ref 78.0–100.0)
Monocytes Absolute: 0.5 K/uL (ref 0.1–1.0)
Monocytes Relative: 10.2 % (ref 3.0–12.0)
Neutro Abs: 2.3 K/uL (ref 1.4–7.7)
Neutrophils Relative %: 44.1 % (ref 43.0–77.0)
Platelets: 422 K/uL — ABNORMAL HIGH (ref 150.0–400.0)
RBC: 5.12 Mil/uL — ABNORMAL HIGH (ref 3.87–5.11)
RDW: 15.9 % — ABNORMAL HIGH (ref 11.5–15.5)
WBC: 5.2 K/uL (ref 4.0–10.5)

## 2024-04-02 LAB — LIPID PANEL
Cholesterol: 160 mg/dL (ref 28–200)
HDL: 59.3 mg/dL
LDL Cholesterol: 86 mg/dL (ref 10–99)
NonHDL: 100.26
Total CHOL/HDL Ratio: 3
Triglycerides: 72 mg/dL (ref 10.0–149.0)
VLDL: 14.4 mg/dL (ref 0.0–40.0)

## 2024-04-02 LAB — HEMOGLOBIN A1C: Hgb A1c MFr Bld: 6 % (ref 4.6–6.5)

## 2024-04-02 MED ORDER — METOPROLOL SUCCINATE ER 100 MG PO TB24: 100.0000 mg | ORAL_TABLET | Freq: Every day | ORAL | 3 refills | Status: AC

## 2024-04-02 MED ORDER — LEVOCETIRIZINE DIHYDROCHLORIDE 5 MG PO TABS: 5.0000 mg | ORAL_TABLET | Freq: Every evening | ORAL | 3 refills | Status: DC

## 2024-04-02 MED ORDER — AMLODIPINE BESYLATE 10 MG PO TABS: 10.0000 mg | ORAL_TABLET | Freq: Every day | ORAL | 3 refills | Status: AC

## 2024-04-02 NOTE — Patient Instructions (Signed)

## 2024-04-02 NOTE — Assessment & Plan Note (Signed)
 BP Readings from Last 3 Encounters:  04/02/24 126/80  03/25/24 120/80  03/18/24 123/73  Well-controlled hypertension Continue amlodipine  10 mg daily and metoprolol  succinate 100 mg daily Cardiovascular risks associated with hypertension discussed Dietary approaches to stop hypertension discussed Benefits of exercise discussed Blood work today Follow-up in 6 months

## 2024-04-02 NOTE — Progress Notes (Signed)
 Emily Downs 43 y.o.   Chief Complaint  Patient presents with   Annual Exam    HISTORY OF PRESENT ILLNESS: This is a 43 y.o. female here for annual exam and follow-up of hypertension Overall doing well.  Has no complaints or medical concerns today Sees gynecologist on a regular basis  HPI   Prior to Admission medications  Medication Sig Start Date End Date Taking? Authorizing Provider  amLODipine  (NORVASC ) 10 MG tablet TAKE 1 TABLET BY MOUTH EVERY DAY 05/20/23  Yes Tuan Tippin, Emil Schanz, MD  benzonatate  (TESSALON  PERLES) 100 MG capsule Take 1 capsule (100 mg total) by mouth 3 (three) times daily as needed. 03/18/24  Yes Dufour, Marry RAMAN, PA-C  famotidine  (PEPCID ) 20 MG tablet Take 1 tablet (20 mg total) by mouth 2 (two) times daily. 10/18/23  Yes Soldatova, Liuba, MD  Iron , Ferrous Sulfate , 325 (65 Fe) MG TABS Take 325 mg by mouth daily. 04/21/21  Yes Gray, Sarah E, NP  levocetirizine (XYZAL ) 5 MG tablet Take 1 tablet (5 mg total) by mouth every evening. 05/22/23  Yes Fae Blossom, Emil Schanz, MD  metoprolol  succinate (TOPROL -XL) 100 MG 24 hr tablet TAKE 1 TABLET BY MOUTH EVERY DAY 05/20/23  Yes Aviel Davalos, Emil Schanz, MD  HYDROcodone  bit-homatropine [facility]) 5-1.5 MG/5ML syrup Take 5 mLs by mouth at bedtime as needed for cough. Patient not taking: Reported on 04/02/2024 03/25/24   Mariza Bourget Jose, MD  promethazine -dextromethorphan (PROMETHAZINE -DM) 6.25-15 MG/5ML syrup Take 5 mLs by mouth 4 (four) times daily as needed for cough. 01/11/22   Wendolyn Jenkins Jansky, MD  triamcinolone  ointment (KENALOG ) 0.1 % SMARTSIG:sparingly Topical Twice Daily Patient not taking: Reported on 04/02/2024 12/24/21   [provider]  valACYclovir (VALTREX) 500 MG tablet Take 500 mg by mouth 2 (two) times daily. 12/24/21   [provider]    Allergies[1]  Patient Active Problem List   Diagnosis Date Noted   Essential hypertension, benign 12/11/2013   Menometrorrhagia    Iron   deficiency anemia     Past Medical History:  Diagnosis Date   Allergy    Beta thalassemia trait    Beta thalassemia trait    Genital herpes    Gestational diabetes    HPV (human papilloma virus) anogenital infection    Hx of chlamydia infection    Hx of pre-eclampsia in prior pregnancy, currently pregnant    Hypertension    Iron  deficiency anemia    Menometrorrhagia     Past Surgical History:  Procedure Laterality Date   CESAREAN SECTION N/A 07/23/2019   Procedure: CESAREAN SECTION;  Surgeon: Gretta Gums, MD;  Location: [facility];  Service: Obstetrics;  Laterality: N/A;   COLPOSCOPY     DILATION AND EVACUATION  2009   DILATION AND EVACUATION N/A 11/08/2015   Procedure: DILATATION AND EVACUATION WITH CHROMASOME STUDIES;  Surgeon: Dickie Carder, MD;  Location: [facility] ORS;  Service: Gynecology;  Laterality: N/A;    Social History   Socioeconomic History   Marital status: Married    Spouse name: Jamilee Lafosse   Number of children: 1   Years of education: MBA   Highest education level: Master's degree (e.g., MA, MS, MEng, MEd, MSW, MBA)  Occupational History   Occupation: social worker   Occupation: residence naval architect    Comment: [employer] (04/23/2016)  Tobacco Use   Smoking status: Never   Smokeless tobacco: Never  Vaping Use   Vaping status: Never Used  Substance and Sexual Activity   Alcohol use: No  Drug use: No   Sexual activity: Not Currently    Birth control/protection: None  Other Topics Concern   Not on file  Social History Narrative   Marital status: married x 7 years      Children: 18 yo daughter.      Lives: with husband, daughter.      Employment:  Interior And Spatial Designer, Residence Naval Architect      Tobacco; none      Alcohol: rare to none in 2018      Drugs:  None      Exercise: none in 2018      Seatbelt: 100%   Social Drivers of Health   Tobacco Use: Low Risk (04/02/2024)   Patient History    Smoking Tobacco Use: Never    Smokeless Tobacco Use:  Never    Passive Exposure: Not on file  Financial Resource Strain: Medium Risk (03/25/2024)   Overall Financial Resource Strain (CARDIA)    Difficulty of Paying Living Expenses: Somewhat hard  Food Insecurity: Food Insecurity Present (03/25/2024)   Epic    Worried About Programme Researcher, Broadcasting/film/video in the Last Year: Sometimes true    Ran Out of Food in the Last Year: Never true  Transportation Needs: No Transportation Needs (03/25/2024)   Epic    Lack of Transportation (Medical): No    Lack of Transportation (Non-Medical): No  Physical Activity: Insufficiently Active (03/25/2024)   Exercise Vital Sign    Days of Exercise per Week: 3 days    Minutes of Exercise per Session: 10 min  Stress: Stress Concern Present (03/25/2024)   [employer] of Occupational Health - Occupational Stress Questionnaire    Feeling of Stress: To some extent  Social Connections: Socially Integrated (03/25/2024)   Social Connection and Isolation Panel    Frequency of Communication with Friends and Family: More than three times a week    Frequency of Social Gatherings with Friends and Family: Never    Attends Religious Services: More than 4 times per year    Active Member of Clubs or Organizations: Yes    Attends Banker Meetings: 1 to 4 times per year    Marital Status: Married  Catering Manager Violence: Not on file  Depression (PHQ2-9): Low Risk (03/25/2024)   Depression (PHQ2-9)    PHQ-2 Score: 0  Alcohol Screen: Low Risk (03/25/2024)   Alcohol Screen    Last Alcohol Screening Score (AUDIT): 1  Housing: Low Risk (03/25/2024)   Epic    Unable to Pay for Housing in the Last Year: No    Number of Times Moved in the Last Year: 0    Homeless in the Last Year: No  Utilities: Not on file  Health Literacy: Not on file    Family History  Problem Relation Age of Onset   Hypertension Mother    Diabetes Mother    Aneurysm Mother 3       brain aneurysm   Sarcoidosis Mother    Hyperlipidemia  Father    Crohn's disease Sister    Hypertension Sister    Diabetes Maternal Grandmother    Stroke Maternal Grandmother    Breast cancer Paternal Grandmother    Cancer Paternal Grandmother      Review of Systems  Constitutional: Negative.  Negative for chills and fever.  HENT: Negative.  Negative for nosebleeds and sore throat.   Respiratory: Negative.  Negative for cough and shortness of breath.   Cardiovascular: Negative.  Negative for chest pain and palpitations.  Gastrointestinal:  Negative for abdominal pain, diarrhea, nausea and vomiting.  Genitourinary: Negative.  Negative for dysuria and hematuria.  Skin: Negative.  Negative for rash.  Neurological: Negative.  Negative for dizziness and headaches.  All other systems reviewed and are negative.   Vitals:   04/02/24 0918  BP: 126/80  Pulse: (!) 52  SpO2: 98%    Physical Exam Vitals reviewed.  Constitutional:      Appearance: Normal appearance.  HENT:     Head: Normocephalic.     Right Ear: Tympanic membrane, ear canal and external ear normal.     Left Ear: Tympanic membrane, ear canal and external ear normal.     Mouth/Throat:     Mouth: Mucous membranes are moist.     Pharynx: Oropharynx is clear.  Eyes:     Extraocular Movements: Extraocular movements intact.     Conjunctiva/sclera: Conjunctivae normal.     Pupils: Pupils are equal, round, and reactive to light.  Cardiovascular:     Rate and Rhythm: Normal rate and regular rhythm.     Pulses: Normal pulses.     Heart sounds: Normal heart sounds.  Pulmonary:     Effort: Pulmonary effort is normal.     Breath sounds: Normal breath sounds.  Abdominal:     Palpations: Abdomen is soft.     Tenderness: There is no abdominal tenderness.  Musculoskeletal:     Cervical back: No tenderness.     Right lower leg: No edema.     Left lower leg: No edema.  Lymphadenopathy:     Cervical: No cervical adenopathy.  Skin:    General: Skin is warm and dry.      Capillary Refill: Capillary refill takes less than 2 seconds.  Neurological:     General: No focal deficit present.     Mental Status: She is alert and oriented to person, place, and time.  Psychiatric:        Mood and Affect: Mood normal.        Behavior: Behavior normal.      ASSESSMENT & PLAN: Problem List Items Addressed This Visit       Cardiovascular and Mediastinum   Essential hypertension, benign   BP Readings from Last 3 Encounters:  04/02/24 126/80  03/25/24 120/80  03/18/24 123/73  Well-controlled hypertension Continue amlodipine  10 mg daily and metoprolol  succinate 100 mg daily Cardiovascular risks associated with hypertension discussed Dietary approaches to stop hypertension discussed Benefits of exercise discussed Blood work today Follow-up in 6 months       Relevant Medications   amLODipine  (NORVASC ) 10 MG tablet   metoprolol  succinate (TOPROL -XL) 100 MG 24 hr tablet   Other Relevant Orders   CBC with Differential/Platelet   Comprehensive metabolic panel with GFR   Hemoglobin A1c   Lipid panel   Other Visit Diagnoses       Encounter for general adult medical examination with abnormal findings    -  Primary   Relevant Orders   CBC with Differential/Platelet   Comprehensive metabolic panel with GFR   Hemoglobin A1c   Lipid panel     Screening for colon cancer       Relevant Orders   Cologuard     Screening for deficiency anemia       Relevant Orders   CBC with Differential/Platelet     Screening for lipoid disorders       Relevant Orders   Lipid panel     Screening for endocrine, metabolic  and immunity disorder       Relevant Orders   Comprehensive metabolic panel with GFR   Hemoglobin A1c      Modifiable risk factors discussed with patient. Anticipatory guidance according to age provided. The following topics were also discussed: Social Determinants of Health Smoking Diet and nutrition Benefits of exercise Cancer screening and  possible colonoscopy and or mammogram Vaccinations Cardiovascular risk assessment Mental health including depression and anxiety Fall and accident prevention  Patient Instructions  Health Maintenance, Female Adopting a healthy lifestyle and getting preventive care are important in promoting health and wellness. Ask your health care provider about: The right schedule for you to have regular tests and exams. Things you can do on your own to prevent diseases and keep yourself healthy. What should I know about diet, weight, and exercise? Eat a healthy diet  Eat a diet that includes plenty of vegetables, fruits, low-fat dairy products, and lean protein. Do not eat a lot of foods that are high in solid fats, added sugars, or sodium. Maintain a healthy weight Body mass index (BMI) is used to identify weight problems. It estimates body fat based on height and weight. Your health care provider can help determine your BMI and help you achieve or maintain a healthy weight. Get regular exercise Get regular exercise. This is one of the most important things you can do for your health. Most adults should: Exercise for at least 150 minutes each week. The exercise should increase your heart rate and make you sweat (moderate-intensity exercise). Do strengthening exercises at least twice a week. This is in addition to the moderate-intensity exercise. Spend less time sitting. Even light physical activity can be beneficial. Watch cholesterol and blood lipids Have your blood tested for lipids and cholesterol at 46 years of age, then have this test every 5 years. Have your cholesterol levels checked more often if: Your lipid or cholesterol levels are high. You are older than 46 years of age. You are at high risk for heart disease. What should I know about cancer screening? Depending on your health history and family history, you may need to have cancer screening at various ages. This may include screening  for: Breast cancer. Cervical cancer. Colorectal cancer. Skin cancer. Lung cancer. What should I know about heart disease, diabetes, and high blood pressure? Blood pressure and heart disease High blood pressure causes heart disease and increases the risk of stroke. This is more likely to develop in people who have high blood pressure readings or are overweight. Have your blood pressure checked: Every 3-5 years if you are 47-16 years of age. Every year if you are 3 years old or older. Diabetes Have regular diabetes screenings. This checks your fasting blood sugar level. Have the screening done: Once every three years after age 78 if you are at a normal weight and have a low risk for diabetes. More often and at a younger age if you are overweight or have a high risk for diabetes. What should I know about preventing infection? Hepatitis B If you have a higher risk for hepatitis B, you should be screened for this virus. Talk with your health care provider to find out if you are at risk for hepatitis B infection. Hepatitis C Testing is recommended for: Everyone born from [birth year] through 1967. Anyone with known risk factors for hepatitis C. Sexually transmitted infections (STIs) Get screened for STIs, including gonorrhea and chlamydia, if: You are sexually active and are younger than 46 years of  age. Rosine are older than 46 years of age and your health care provider tells you that you are at risk for this type of infection. Your sexual activity has changed since you were last screened, and you are at increased risk for chlamydia or gonorrhea. Ask your health care provider if you are at risk. Ask your health care provider about whether you are at high risk for HIV. Your health care provider may recommend a prescription medicine to help prevent HIV infection. If you choose to take medicine to prevent HIV, you should first get tested for HIV. You should then be tested every 3 months for as long as you  are taking the medicine. Pregnancy If you are about to stop having your period (premenopausal) and you may become pregnant, seek counseling before you get pregnant. Take 400 to 800 micrograms (mcg) of folic acid  every day if you become pregnant. Ask for birth control (contraception) if you want to prevent pregnancy. Osteoporosis and menopause Osteoporosis is a disease in which the bones lose minerals and strength with aging. This can result in bone fractures. If you are 16 years old or older, or if you are at risk for osteoporosis and fractures, ask your health care provider if you should: Be screened for bone loss. Take a calcium  or vitamin D  supplement to lower your risk of fractures. Be given hormone replacement therapy (HRT) to treat symptoms of menopause. Follow these instructions at home: Alcohol use Do not drink alcohol if: Your health care provider tells you not to drink. You are pregnant, may be pregnant, or are planning to become pregnant. If you drink alcohol: Limit how much you have to: 0-1 drink a day. Know how much alcohol is in your drink. In the [place] one drink equals one 12 oz bottle of beer (355 mL), one 5 oz glass of wine (148 mL), or one 1 oz glass of hard liquor (44 mL). Lifestyle Do not use any products that contain nicotine or tobacco. These products include cigarettes, chewing tobacco, and vaping devices, such as e-cigarettes. If you need help quitting, ask your health care provider. Do not use street drugs. Do not share needles. Ask your health care provider for help if you need support or information about quitting drugs. General instructions Schedule regular health, dental, and eye exams. Stay current with your vaccines. Tell your health care provider if: You often feel depressed. You have ever been abused or do not feel safe at home. Summary Adopting a healthy lifestyle and getting preventive care are important in promoting health and wellness. Follow your  health care provider's instructions about healthy diet, exercising, and getting tested or screened for diseases. Follow your health care provider's instructions on monitoring your cholesterol and blood pressure. This information is not intended to replace advice given to you by your health care provider. Make sure you discuss any questions you have with your health care provider. Document Revised: 08/01/2020 Document Reviewed: 08/01/2020 Elsevier Patient Education  2024 Elsevier Inc.     Emil Schaumann, MD [facility]     [1] No Known Allergies

## 2024-04-16 ENCOUNTER — Inpatient Hospital Stay: Admitting: Internal Medicine

## 2024-04-16 ENCOUNTER — Inpatient Hospital Stay: Attending: Internal Medicine

## 2024-04-16 VITALS — BP 128/67 | HR 78 | Temp 97.4°F | Resp 17 | Ht 60.0 in | Wt 164.0 lb

## 2024-04-16 DIAGNOSIS — D509 Iron deficiency anemia, unspecified: Secondary | ICD-10-CM | POA: Diagnosis present

## 2024-04-16 DIAGNOSIS — D563 Thalassemia minor: Secondary | ICD-10-CM | POA: Diagnosis not present

## 2024-04-16 DIAGNOSIS — Z79899 Other long term (current) drug therapy: Secondary | ICD-10-CM | POA: Diagnosis not present

## 2024-04-16 DIAGNOSIS — D508 Other iron deficiency anemias: Secondary | ICD-10-CM

## 2024-04-16 LAB — CBC WITH DIFFERENTIAL (CANCER CENTER ONLY)
Abs Immature Granulocytes: 0.02 K/uL (ref 0.00–0.07)
Basophils Absolute: 0 K/uL (ref 0.0–0.1)
Basophils Relative: 1 %
Eosinophils Absolute: 0.3 K/uL (ref 0.0–0.5)
Eosinophils Relative: 6 %
HCT: 39.5 % (ref 36.0–46.0)
Hemoglobin: 12.4 g/dL (ref 12.0–15.0)
Immature Granulocytes: 0 %
Lymphocytes Relative: 39 %
Lymphs Abs: 1.7 K/uL (ref 0.7–4.0)
MCH: 22.6 pg — ABNORMAL LOW (ref 26.0–34.0)
MCHC: 31.4 g/dL (ref 30.0–36.0)
MCV: 71.9 fL — ABNORMAL LOW (ref 80.0–100.0)
Monocytes Absolute: 0.7 K/uL (ref 0.1–1.0)
Monocytes Relative: 15 %
Neutro Abs: 1.7 K/uL (ref 1.7–7.7)
Neutrophils Relative %: 39 %
Platelet Count: 375 K/uL (ref 150–400)
RBC: 5.49 MIL/uL — ABNORMAL HIGH (ref 3.87–5.11)
RDW: 16.3 % — ABNORMAL HIGH (ref 11.5–15.5)
WBC Count: 4.5 K/uL (ref 4.0–10.5)
nRBC: 0 % (ref 0.0–0.2)

## 2024-04-16 LAB — COLOGUARD: COLOGUARD: NEGATIVE

## 2024-04-16 LAB — IRON AND IRON BINDING CAPACITY (CC-WL,HP ONLY)
Iron: 43 ug/dL (ref 28–170)
Saturation Ratios: 11 % (ref 10.4–31.8)
TIBC: 384 ug/dL (ref 250–450)
UIBC: 341 ug/dL

## 2024-04-16 LAB — FERRITIN: Ferritin: 58 ng/mL (ref 11–307)

## 2024-04-16 NOTE — Progress Notes (Signed)
 "     [facility] [phone]   [phone]  OFFICE PROGRESS NOTE  [place] Emil Schanz, MD [address] [place] [address]  DIAGNOSIS: Iron  deficiency anemia and thalassemia minor  PRIOR THERAPY: None  CURRENT THERAPY: Ferrous sulfate  325 mg p.o. daily  INTERVAL HISTORY: Emily Downs 44 y.o. female turns to the clinic today for 74-month follow-up visit.Discussed the use of AI scribe software for clinical note transcription with the patient, who gave verbal consent to proceed.  History of Present Illness Emily Downs is a 44 year old female with thalassemia minor and iron  deficiency who presents for routine hematology follow-up.  She is currently managed with daily oral iron  supplementation (3.25 mg) and vitamin C, taken with food to minimize gastrointestinal upset. She experiences mild stomach discomfort only when iron  is taken on an empty stomach, but otherwise tolerates therapy well without constipation or other adverse effects.  She denies dizziness, bleeding, or other concerning symptoms. She feels generally well, though notes mild somnolence today, which she attributes to seasonal factors. No new symptoms or complications related to thalassemia minor or iron  deficiency were reported.  Recent laboratory evaluation showed a hemoglobin of 12.4 g/dL, with prior values of 88.7 g/dL in July and 88.4 g/dL in January. Platelet and white blood cell counts were reported as normal in recent laboratory results. Iron  studies are pending.     MEDICAL HISTORY: Past Medical History:  Diagnosis Date   Allergy    Beta thalassemia trait    Beta thalassemia trait    Genital herpes    Gestational diabetes    HPV (human papilloma virus) anogenital infection    Hx of chlamydia infection    Hx of pre-eclampsia in prior pregnancy, currently pregnant    Hypertension    Iron  deficiency anemia    Menometrorrhagia     ALLERGIES:  has no  known allergies.  MEDICATIONS:  Current Outpatient Medications  Medication Sig Dispense Refill   amLODipine  (NORVASC ) 10 MG tablet Take 1 tablet (10 mg total) by mouth daily. 90 tablet 3   benzonatate  (TESSALON  PERLES) 100 MG capsule Take 1 capsule (100 mg total) by mouth 3 (three) times daily as needed. 20 capsule 0   desloratadine (CLARINEX) 5 MG tablet Take 1 tablet (5 mg total) by mouth daily. 30 tablet 1   famotidine  (PEPCID ) 20 MG tablet Take 1 tablet (20 mg total) by mouth 2 (two) times daily. 30 tablet 1   HYDROcodone  bit-homatropine (HYCODAN) 5-1.5 MG/5ML syrup Take 5 mLs by mouth at bedtime as needed for cough. (Patient not taking: Reported on 04/02/2024) 120 mL 0   Iron , Ferrous Sulfate , 325 (65 Fe) MG TABS Take 325 mg by mouth daily. 30 tablet 1   metoprolol  succinate (TOPROL -XL) 100 MG 24 hr tablet Take 1 tablet (100 mg total) by mouth daily. 90 tablet 3   No current facility-administered medications for this visit.    SURGICAL HISTORY:  Past Surgical History:  Procedure Laterality Date   CESAREAN SECTION N/A 07/23/2019   Procedure: CESAREAN SECTION;  Surgeon: Gretta Gums, MD;  Location: [facility];  Service: Obstetrics;  Laterality: N/A;   COLPOSCOPY     DILATION AND EVACUATION  2009   DILATION AND EVACUATION N/A 11/08/2015   Procedure: DILATATION AND EVACUATION WITH CHROMASOME STUDIES;  Surgeon: Dickie Carder, MD;  Location: [facility]S;  Service: Gynecology;  Laterality: N/A;    REVIEW OF SYSTEMS:  A comprehensive review of systems was negative.  PHYSICAL EXAMINATION: General appearance: alert, cooperative, and no distress Head: Normocephalic, without obvious abnormality, atraumatic Neck: no adenopathy, no JVD, supple, symmetrical, trachea midline, and thyroid not enlarged, symmetric, no tenderness/mass/nodules Lymph nodes: Cervical, supraclavicular, and axillary nodes normal. Resp: clear to auscultation bilaterally Back: symmetric, no curvature. ROM normal. No CVA  tenderness. Cardio: regular rate and rhythm, S1, S2 normal, no murmur, click, rub or gallop GI: soft, non-tender; bowel sounds normal; no masses,  no organomegaly Extremities: extremities normal, atraumatic, no cyanosis or edema  ECOG PERFORMANCE STATUS: 0 - Asymptomatic  Blood pressure 128/67, pulse 78, temperature (!) 97.4 F (36.3 C), temperature source Temporal, resp. rate 17, height 5' (1.524 m), weight 164 lb (74.4 kg), last menstrual period 03/31/2024, SpO2 100%.  LABORATORY DATA: Lab Results  Component Value Date   WBC 4.5 04/16/2024   HGB 12.4 04/16/2024   HCT 39.5 04/16/2024   MCV 71.9 (L) 04/16/2024   PLT 375 04/16/2024      Chemistry      Component Value Date/Time   NA 138 04/02/2024 1003   NA 140 06/02/2020 1707   NA 138 03/10/2012 1315   K 3.8 04/02/2024 1003   K 4.3 03/10/2012 1315   CL 104 04/02/2024 1003   CL 105 03/10/2012 1315   CO2 30 04/02/2024 1003   CO2 28 03/10/2012 1315   BUN 13 04/02/2024 1003   BUN 13 06/02/2020 1707   BUN 12.0 03/10/2012 1315   CREATININE 0.75 04/02/2024 1003   CREATININE 0.88 05/10/2021 1315   CREATININE 0.92 07/26/2015 0942   CREATININE 0.9 03/10/2012 1315      Component Value Date/Time   CALCIUM  8.5 04/02/2024 1003   CALCIUM  8.5 03/10/2012 1315   ALKPHOS 58 04/02/2024 1003   ALKPHOS 49 03/10/2012 1315   AST 25 04/02/2024 1003   AST 23 05/10/2021 1315   AST 21 03/10/2012 1315   ALT 24 04/02/2024 1003   ALT 16 05/10/2021 1315   ALT 16 03/10/2012 1315   BILITOT 0.3 04/02/2024 1003   BILITOT 0.4 05/10/2021 1315   BILITOT 0.28 03/10/2012 1315       RADIOGRAPHIC STUDIES: No results found.  ASSESSMENT AND PLAN: This is a very pleasant 44 years old African-American female with history of iron  deficiency anemia as well as thalassemia minor.  The patient is currently on treatment with ferrous sulfate  325 mg p.o. daily.  She is tolerating this treatment fairly well. Assessment and Plan Assessment & Plan Thalassemia  minor Hemoglobin has improved to 12.4 g/dL from previous values in the 11 g/dL range. - Continued routine hematology follow-up every six months.  Iron  deficiency Iron  deficiency is managed with daily oral iron  and vitamin C. She experiences mild gastrointestinal upset if taken on an empty stomach but otherwise tolerates therapy. Hemoglobin has improved to 12.4 g/dL, indicating improved iron  status. Awaiting iron  studies for confirmation. She denies dizziness or bleeding. - Continued current oral iron  supplementation regimen. - Awaited results of iron  studies. - Planned iron  infusion if iron  studies are markedly low. - Scheduled follow-up in six months, with earlier intervention if indicated by iron  studies. She was advised to call immediately if she has any other concerning symptoms in the interval.   The patient voices understanding of current disease status and treatment options and is in agreement with the current care plan.  All questions were answered. The patient knows to call the clinic with any problems, questions or concerns. We can certainly see the patient much sooner if necessary.  The total time spent in the appointment was 20 minutes.  Disclaimer: This note was dictated with voice recognition software. Similar sounding words can inadvertently be transcribed and may not be corrected upon review.        "

## 2024-04-27 ENCOUNTER — Other Ambulatory Visit: Payer: Self-pay | Admitting: Emergency Medicine

## 2024-10-14 ENCOUNTER — Inpatient Hospital Stay

## 2024-10-14 ENCOUNTER — Inpatient Hospital Stay: Admitting: Internal Medicine
